# Patient Record
Sex: Male | Born: 1970 | Race: Black or African American | Hispanic: No | Smoking: Current every day smoker
Health system: Southern US, Community
[De-identification: ages and names within clinical notes are randomized; demographics above are authoritative.]

## PROBLEM LIST (undated history)

## (undated) DIAGNOSIS — D509 Iron deficiency anemia, unspecified: Secondary | ICD-10-CM

## (undated) DIAGNOSIS — J154 Pneumonia due to other streptococci: Secondary | ICD-10-CM

## (undated) DIAGNOSIS — I8390 Asymptomatic varicose veins of unspecified lower extremity: Secondary | ICD-10-CM

## (undated) DIAGNOSIS — R627 Adult failure to thrive: Secondary | ICD-10-CM

## (undated) DIAGNOSIS — B20 Human immunodeficiency virus [HIV] disease: Secondary | ICD-10-CM

## (undated) DIAGNOSIS — D61818 Other pancytopenia: Secondary | ICD-10-CM

## (undated) DIAGNOSIS — A312 Disseminated mycobacterium avium-intracellulare complex (DMAC): Secondary | ICD-10-CM

## (undated) HISTORY — PX: LUNG SURGERY: SHX703

---

## 2014-08-02 DIAGNOSIS — J154 Pneumonia due to other streptococci: Secondary | ICD-10-CM

## 2014-08-02 HISTORY — DX: Pneumonia due to other streptococci: J15.4

## 2014-08-28 ENCOUNTER — Encounter (HOSPITAL_BASED_OUTPATIENT_CLINIC_OR_DEPARTMENT_OTHER): Payer: Self-pay

## 2014-08-28 ENCOUNTER — Emergency Department (HOSPITAL_BASED_OUTPATIENT_CLINIC_OR_DEPARTMENT_OTHER): Payer: BLUE CROSS/BLUE SHIELD

## 2014-08-28 ENCOUNTER — Emergency Department (HOSPITAL_BASED_OUTPATIENT_CLINIC_OR_DEPARTMENT_OTHER)
Admission: EM | Admit: 2014-08-28 | Discharge: 2014-08-28 | Disposition: A | Discharge status: Home / Self Care | Payer: BLUE CROSS/BLUE SHIELD | Attending: Emergency Medicine | Admitting: Emergency Medicine

## 2014-08-28 DIAGNOSIS — B20 Human immunodeficiency virus [HIV] disease: Secondary | ICD-10-CM

## 2014-08-28 DIAGNOSIS — Z5320 Procedure and treatment not carried out because of patient's decision for unspecified reasons: Secondary | ICD-10-CM

## 2014-08-28 DIAGNOSIS — Z720 Tobacco use: Secondary | ICD-10-CM | POA: Diagnosis not present

## 2014-08-28 DIAGNOSIS — Z5329 Procedure and treatment not carried out because of patient's decision for other reasons: Secondary | ICD-10-CM

## 2014-08-28 DIAGNOSIS — J159 Unspecified bacterial pneumonia: Secondary | ICD-10-CM | POA: Diagnosis not present

## 2014-08-28 DIAGNOSIS — R000 Tachycardia, unspecified: Secondary | ICD-10-CM | POA: Diagnosis not present

## 2014-08-28 DIAGNOSIS — Z21 Asymptomatic human immunodeficiency virus [HIV] infection status: Secondary | ICD-10-CM | POA: Diagnosis not present

## 2014-08-28 DIAGNOSIS — B370 Candidal stomatitis: Secondary | ICD-10-CM | POA: Insufficient documentation

## 2014-08-28 DIAGNOSIS — R079 Chest pain, unspecified: Secondary | ICD-10-CM | POA: Diagnosis present

## 2014-08-28 DIAGNOSIS — J189 Pneumonia, unspecified organism: Secondary | ICD-10-CM

## 2014-08-28 LAB — T-HELPER CELLS (CD4) COUNT (NOT AT ARMC)
CD4 % Helper T Cell: 1 % — ABNORMAL LOW (ref 33–55)
CD4 T Cell Abs: 10 /uL — ABNORMAL LOW (ref 400–2700)

## 2014-08-28 LAB — HEPATIC FUNCTION PANEL
ALBUMIN: 3.2 g/dL — AB (ref 3.5–5.2)
ALT: 12 U/L (ref 0–53)
AST: 19 U/L (ref 0–37)
Alkaline Phosphatase: 71 U/L (ref 39–117)
Bilirubin, Direct: 0.2 mg/dL (ref 0.0–0.5)
Indirect Bilirubin: 0.4 mg/dL (ref 0.3–0.9)
TOTAL PROTEIN: 7.1 g/dL (ref 6.0–8.3)
Total Bilirubin: 0.6 mg/dL (ref 0.3–1.2)

## 2014-08-28 LAB — CBC
HEMATOCRIT: 39.9 % (ref 39.0–52.0)
HEMOGLOBIN: 13 g/dL (ref 13.0–17.0)
MCH: 27.8 pg (ref 26.0–34.0)
MCHC: 32.6 g/dL (ref 30.0–36.0)
MCV: 85.3 fL (ref 78.0–100.0)
Platelets: 177 10*3/uL (ref 150–400)
RBC: 4.68 MIL/uL (ref 4.22–5.81)
RDW: 13 % (ref 11.5–15.5)
WBC: 8.5 10*3/uL (ref 4.0–10.5)

## 2014-08-28 LAB — BASIC METABOLIC PANEL
Anion gap: 2 — ABNORMAL LOW (ref 5–15)
BUN: 9 mg/dL (ref 6–23)
CALCIUM: 8.1 mg/dL — AB (ref 8.4–10.5)
CHLORIDE: 100 mmol/L (ref 96–112)
CO2: 28 mmol/L (ref 19–32)
CREATININE: 0.76 mg/dL (ref 0.50–1.35)
GFR calc Af Amer: >90 mL/min (ref >90)
GFR calc non Af Amer: >90 mL/min (ref >90)
GLUCOSE: 97 mg/dL (ref 70–99)
Potassium: 3.5 mmol/L (ref 3.5–5.1)
Sodium: 130 mmol/L — ABNORMAL LOW (ref 135–145)

## 2014-08-28 LAB — INFLUENZA PANEL BY PCR (TYPE A & B)
H1N1 flu by pcr: NOT DETECTED
INFLBPCR: NEGATIVE
Influenza A By PCR: NEGATIVE

## 2014-08-28 LAB — TROPONIN I: Troponin I: <0.03 ng/mL (ref <0.031)

## 2014-08-28 MED ORDER — CEFTRIAXONE SODIUM 1 G IJ SOLR
INTRAMUSCULAR | Status: AC
  Filled 2014-08-28: qty 10

## 2014-08-28 MED ORDER — IBUPROFEN 800 MG PO TABS
800.0000 mg | ORAL_TABLET | Freq: Once | ORAL | Status: AC
  Administered 2014-08-28: 800 mg via ORAL
  Filled 2014-08-28: qty 1

## 2014-08-28 MED ORDER — AZITHROMYCIN 250 MG PO TABS: 250.0000 mg | ORAL_TABLET | Freq: Every day | ORAL | Status: DC

## 2014-08-28 MED ORDER — ONDANSETRON HCL 4 MG/2ML IJ SOLN
4.0000 mg | Freq: Once | INTRAMUSCULAR | Status: AC
  Administered 2014-08-28: 4 mg via INTRAVENOUS
  Filled 2014-08-28: qty 2

## 2014-08-28 MED ORDER — ACETAMINOPHEN 325 MG PO TABS
650.0000 mg | ORAL_TABLET | Freq: Once | ORAL | Status: AC
  Administered 2014-08-28: 650 mg via ORAL
  Filled 2014-08-28: qty 2

## 2014-08-28 MED ORDER — SULFAMETHOXAZOLE-TRIMETHOPRIM 800-160 MG PO TABS: 1.0000 | ORAL_TABLET | Freq: Two times a day (BID) | ORAL | Status: DC

## 2014-08-28 MED ORDER — AZITHROMYCIN 250 MG PO TABS
500.0000 mg | ORAL_TABLET | Freq: Once | ORAL | Status: AC
  Administered 2014-08-28: 500 mg via ORAL
  Filled 2014-08-28: qty 2

## 2014-08-28 MED ORDER — CLOTRIMAZOLE 10 MG MT TROC: 10.0000 mg | Freq: Every day | OROMUCOSAL | Status: DC

## 2014-08-28 MED ORDER — DEXTROSE 5 % IV SOLN
1.0000 g | Freq: Once | INTRAVENOUS | Status: AC
  Administered 2014-08-28: 1 g via INTRAVENOUS

## 2014-08-28 MED ORDER — SULFAMETHOXAZOLE-TRIMETHOPRIM 800-160 MG PO TABS
1.0000 | ORAL_TABLET | Freq: Once | ORAL | Status: AC
  Administered 2014-08-28: 1 via ORAL
  Filled 2014-08-28: qty 1

## 2014-08-28 NOTE — ED Notes (Signed)
Patient here with right sided chest pain that started at 0100. Reports some shortness of breath with same, has had cold symptoms for the past few days. Diagnosed with HIV in 2001 and not currently taking any meds for same. Reports that the pain is worse with movement and inspiration.

## 2014-08-28 NOTE — ED Provider Notes (Signed)
CSN: [id number]     Arrival date & time 08/28/14  3267 History   First MD Initiated Contact with Patient 08/28/14 0827     Chief Complaint  Patient presents with  . Chest Pain     (Consider location/radiation/quality/duration/timing/severity/associated sxs/prior Treatment) HPI The patient has had about a month of cough. However only recently over the past couple of days as he developed fever and chest pain. The cough has become very harsh. And he has chest pain with coughing. He reports the pain is all of his back and chest and it aches. He denies he is brought up any blood with coughing. He reports occasionally there is a small amount of sputum. He denies significant associated nasal drainage discharge or sore throat. He denies she's developed rashes or lower extremity swelling or pain. The patient has HIV but has not been treated with antivirals for over a year. He has not been seeking any medical care. The patient states he is a daily smoker. He denies any drug use or IV drug use. Past Medical History  Diagnosis Date  . HIV (human immunodeficiency virus infection)    History reviewed. No pertinent past surgical history. No family history on file. History  Substance Use Topics  . Smoking status: Current Every Day Smoker -- 1.00 packs/day    Types: Cigarettes  . Smokeless tobacco: Not on file  . Alcohol Use: Yes    Review of Systems  10 Systems reviewed and are negative for acute change except as noted in the HPI.   Allergies  Review of patient's allergies indicates no known allergies.  Home Medications   Prior to Admission medications   Medication Sig Start Date End Date Taking? Authorizing Provider  azithromycin (ZITHROMAX) 250 MG tablet Take 1 tablet (250 mg total) by mouth daily. 1 every day until finished. 08/28/14   Charlesetta Shanks, MD  clotrimazole (MYCELEX) 10 MG troche Take 1 tablet (10 mg total) by mouth 5 (five) times daily. 08/28/14   Charlesetta Shanks, MD   sulfamethoxazole-trimethoprim (SEPTRA DS) 800-160 MG per tablet Take 1 tablet by mouth every 12 (twelve) hours. 08/28/14   Charlesetta Shanks, MD   BP 112/69 mmHg  Pulse 95  Temp(Src) 100 F (37.8 C) (Oral)  Resp 20  Ht 5\' 7"  (1.702 m)  Wt 170 lb (77.111 kg)  BMI 26.62 kg/m2  SpO2 96% Physical Exam  Constitutional: He is oriented to person, place, and time.  The patient is thin in appearance. He has harsh cough. He is not in acute respiratory distress. The patient's mental status is clear.  HENT:  Head: Normocephalic and atraumatic.  Nose: Nose normal.  The throat and oral mucosa are erythematous with what appears to be a small amount of white plaque on the mucosal surfaces of the soft palate and the posterior throat.  Eyes: EOM are normal. Pupils are equal, round, and reactive to light.  Bilateral conjunctiva have mild injection.  Neck: Neck supple.  Cardiovascular: Regular rhythm, normal heart sounds and intact distal pulses.   Tachycardic.  Pulmonary/Chest: Effort normal and breath sounds normal. No respiratory distress. He has no wheezes. He has no rales.  Intermittent harsh cough paroxysmal.  Abdominal: Soft. Bowel sounds are normal. He exhibits no distension. There is no tenderness.  Musculoskeletal: Normal range of motion. He exhibits no edema.  Neurological: He is alert and oriented to person, place, and time. He has normal strength. Coordination normal. GCS eye subscore is 4. GCS verbal subscore is 5. GCS motor  subscore is 6.  Skin: Skin is warm, dry and intact.  Psychiatric: He has a normal mood and affect.    ED Course  Procedures (including critical care time) Labs Review Labs Reviewed  BASIC METABOLIC PANEL - Abnormal; Notable for the following:    Sodium 130 (*)    Calcium 8.1 (*)    Anion gap 2 (*)    All other components within normal limits  HEPATIC FUNCTION PANEL - Abnormal; Notable for the following:    Albumin 3.2 (*)    All other components within normal  limits  CULTURE, BLOOD (ROUTINE X 2)  CULTURE, BLOOD (ROUTINE X 2)  CBC  TROPONIN I  T-HELPER CELLS (CD4) COUNT  INFLUENZA PANEL BY PCR (TYPE A & B, H1N1)    Imaging Review Dg Chest Port 1 View  08/28/2014   CLINICAL DATA:  Right-sided chest pain starting at 1 a.m. Some dyspnea. HIV-positive, not currently on medications.  EXAM: PORTABLE CHEST - 1 VIEW  COMPARISON:  None.  FINDINGS: A single AP portable view of the chest demonstrates no focal airspace consolidation or alveolar edema. The lungs are grossly clear. There is no large effusion or pneumothorax. There is mild cardiomegaly. Cardiac and mediastinal contours are otherwise unremarkable.  IMPRESSION: Cardiomegaly.   Electronically Signed   By: Andreas Newport M.D.   On: 08/28/2014 09:31     EKG Interpretation None      MDM   Final diagnoses:  HIV (human immunodeficiency virus infection)  Community acquired pneumonia  Candidiasis of mouth  Left against medical advice   The patient presents with untreated HIV and acute fever and chronic cough. I had significant concern for immunosuppression and possible advanced disease. I did advise the patient for admission and further evaluation for HIV associated pneumonia conditions. The patient however refuses admission. He reports that he cannot be in the hospital this point time and will have to pursue outpatient treatment. His mother who is with him states that his refusal for inpatient treatment has to do with fear of losing his job. The patient does not state his reason. The patient will be treated with Rocephin and Zithromax for possible community-acquired pneumonia. Bactrim will be added for pneumocystis coverage. Currently there are no appearance of cavitary lesions on his chest x-ray to suggest mycobacterium. Oral mucosa appear suspicious for candidiasis. Patient denies pain or sore throat.    Charlesetta Shanks, MD 08/28/14 1110

## 2014-08-28 NOTE — Discharge Instructions (Signed)
Pneumonia   Because you have untreated HIV, your symptoms may be much more severe and require more complicated treatment. It is very important that you schedule a recheck with an infectious disease specialist as soon as possible.   Pneumonia is an infection of the lungs.  CAUSES Pneumonia may be caused by bacteria or a virus. Usually, these infections are caused by breathing infectious particles into the lungs (respiratory tract). SIGNS AND SYMPTOMS   Cough.  Fever.  Chest pain.  Increased rate of breathing.  Wheezing.  Mucus production. DIAGNOSIS  If you have the common symptoms of pneumonia, your health care provider will typically confirm the diagnosis with a chest X-ray. The X-ray will show an abnormality in the lung (pulmonary infiltrate) if you have pneumonia. Other tests of your blood, urine, or sputum may be done to find the specific cause of your pneumonia. Your health care provider may also do tests (blood gases or pulse oximetry) to see how well your lungs are working. TREATMENT  Some forms of pneumonia may be spread to other people when you cough or sneeze. You may be asked to wear a mask before and during your exam. Pneumonia that is caused by bacteria is treated with antibiotic medicine. Pneumonia that is caused by the influenza virus may be treated with an antiviral medicine. Most other viral infections must run their course. These infections will not respond to antibiotics.  HOME CARE INSTRUCTIONS   Cough suppressants may be used if you are losing too much rest. However, coughing protects you by clearing your lungs. You should avoid using cough suppressants if you can.  Your health care provider may have prescribed medicine if he or she thinks your pneumonia is caused by bacteria or influenza. Finish your medicine even if you start to feel better.  Your health care provider may also prescribe an expectorant. This loosens the mucus to be coughed up.  Take medicines only  as directed by your health care provider.  Do not smoke. Smoking is a common cause of bronchitis and can contribute to pneumonia. If you are a smoker and continue to smoke, your cough may last several weeks after your pneumonia has cleared.  A cold steam vaporizer or humidifier in your room or home may help loosen mucus.  Coughing is often worse at night. Sleeping in a semi-upright position in a recliner or using a couple pillows under your head will help with this.  Get rest as you feel it is needed. Your body will usually let you know when you need to rest. PREVENTION A pneumococcal shot (vaccine) is available to prevent a common bacterial cause of pneumonia. This is usually suggested for:  People over 4 years old.  Patients on chemotherapy.  People with chronic lung problems, such as bronchitis or emphysema.  People with immune system problems. If you are over 65 or have a high risk condition, you may receive the pneumococcal vaccine if you have not received it before. In some countries, a routine influenza vaccine is also recommended. This vaccine can help prevent some cases of pneumonia.You may be offered the influenza vaccine as part of your care. If you smoke, it is time to quit. You may receive instructions on how to stop smoking. Your health care provider can provide medicines and counseling to help you quit. SEEK MEDICAL CARE IF: You have a fever. SEEK IMMEDIATE MEDICAL CARE IF:   Your illness becomes worse. This is especially true if you are elderly or weakened from  any other disease.  You cannot control your cough with suppressants and are losing sleep.  You begin coughing up blood.  You develop pain which is getting worse or is uncontrolled with medicines.  Any of the symptoms which initially brought you in for treatment are getting worse rather than better.  You develop shortness of breath or chest pain. MAKE SURE YOU:   Understand these instructions.  Will  watch your condition.  Will get help right away if you are not doing well or get worse. Document Released: 07/19/2005 Document Revised: 12/03/2013 Document Reviewed: 10/08/2010 [facility] Patient Information 2015 [place], [place]. This information is not intended to replace advice given to you by your health care provider. Make sure you discuss any questions you have with your health care provider.  Emergency Department Resource Guide 1) Find a Doctor and Pay Out of Pocket Although you won't have to find out who is covered by your insurance plan, it is a good idea to ask around and get recommendations. You will then need to call the office and see if the doctor you have chosen will accept you as a new patient and what types of options they offer for patients who are self-pay. Some doctors offer discounts or will set up payment plans for their patients who do not have insurance, but you will need to ask so you aren't surprised when you get to your appointment.  2) Contact Your Local Health Department Not all health departments have doctors that can see patients for sick visits, but many do, so it is worth a call to see if yours does. If you don't know where your local health department is, you can check in your phone book. The CDC also has a tool to help you locate your state's health department, and many state websites also have listings of all of their local health departments.  3) Find a [facility] If your illness is not likely to be very severe or complicated, you may want to try a walk in clinic. These are popping up all over the country in pharmacies, drugstores, and shopping centers. They're usually staffed by nurse practitioners or physician assistants that have been trained to treat common illnesses and complaints. They're usually fairly quick and inexpensive. However, if you have serious medical issues or chronic medical problems, these are probably not your best option.  No Primary Care  Doctor: - Call Health Connect at  [phone] - they can help you locate a primary care doctor that  accepts your insurance, provides certain services, etc. - Physician Referral Service- [phone]  Chronic Pain Problems: Organization         Address  Phone   Notes  [facility]  [phone] Patients need to be referred by their primary care doctor.   Medication Assistance: Organization         Address  Phone   Notes  [facility] Medication [facility] [address] [place], [place] [address] [phone] --Must be a resident of [facility] -- Must have NO insurance coverage whatsoever (no Medicaid/ Medicare, etc.) -- The pt. MUST have a primary care doctor that directs their care regularly and follows them in the community   MedAssist  [phone]   [employer]  [phone]    Agencies that provide inexpensive medical care: Organization         Address  Phone   Notes  [place]  [phone]  [facility]    [phone]   [facility] [place], [place] [address] [phone]   [place]. [address], [place] [phone]   Planned Parenthood    [phone]   [facility]    [phone]   [place] and [place] [address], [place] Phone:  [phone], Fax:  [phone] Hours of Operation:  9 am - 6 pm, M-F.  Also accepts Medicaid/Medicare and self-pay.  [facility] for [place] [address] [place], [address], [place] Phone: [phone], Fax: [phone]. Hours of Operation:  8:30 am - 5:30 pm, M-F.  Also accepts Medicaid and self-pay.  [facility] [place] [address] [place] Phone: [phone]   [place], [place], [place] [phone], Ext. [phone] Mondays & Thursdays: 7-9 AM.  First 15 patients are seen on a first  come, first serve basis.    [place] Providers:  Organization         Address  Phone   Notes  [facility] [address], [place] [phone] Also accepts self-pay patients.  [facility] [address], [place]  [phone]   [place], [address], [place] [phone]   [facility] [address], [place] [phone]   Lucianne Lei [address], [place]   [phone] Only accepts [place] Access [place] patients after they have their name applied to their card.   Self-Pay (no insurance) in [facility]:  Organization         Address  Phone   Notes  Sickle Cell Patients, [facility] [place] [phone]   [facility] [place] [phone]   [facility] [place]  [place], [place], [place] [phone]   [facility]/Dr. Osei-Bonsu  [address], [place] or [address], [place] [phone] Phone number for both [place] and [place] locations is the same.  [facility]gent [facility] [address] [place] [phone]   [facility] [address] [place] or [address] [phone]   [facility] [place] [address] [place] [phone], phone; [phone], fax Sees patients 1st and 3rd Saturday of every month.  Must not qualify for public or private insurance (i.e. Medicaid, Medicare,  Health Choice, Veterans' Benefits)  Household income should be no more than 200% of the poverty level The clinic cannot treat you if you are pregnant or think you are pregnant  Sexually transmitted diseases are not treated at the clinic.    Dental Care: Organization          Address  Phone  Notes  [facility] Department of [facility] [place] [phone] Accepts children up to age 37 who are enrolled in [place] or [place]; pregnant women with a Medicaid card; and children who have applied for Medicaid or  Health Choice, but were declined, whose parents can pay a reduced fee at time of service.  [facility] Department of [facility]  [address],  [place] [phone] Accepts children up to age 97 who are enrolled in Medicaid or [place] Health Choice; pregnant women with a Medicaid card; and children who have applied for Medicaid or [place] Health Choice, but were declined, whose parents can pay a reduced fee at time of service.  [place] Adult [facility] Access PROGRAM  [place] [phone] Patients are seen by appointment only. Walk-ins are not accepted. [place] will see patients 6 years of age and older. Monday - Tuesday (8am-5pm) Most Wednesdays (8:30-5pm) $30 per visit, cash only  [facility] Adult Dental Access PROGRAM  [address], [facility] [phone] Patients are seen by appointment only. Walk-ins are not accepted. [place] will see patients 70 years of age and older. One Wednesday Evening (Monthly: Volunteer Based).  $30 per visit, cash only  [place]  [phone] for adults; Children under age 35, call Graduate Pediatric Dentistry at [phone]. Children aged 36-14, please call [phone] to request a pediatric application.  Dental services are provided in all areas of dental care including fillings, crowns and bridges, complete and partial dentures, implants, gum treatment, root canals, and extractions. Preventive care is also provided. Treatment is provided to both adults and children. Patients are selected via a lottery and there is often a waiting list.   [facility] [address], [place]  [phone] [email or web address]   [facility] [address] [place], [place] [phone], Ext. [phone] Second and Fourth Thursday of each month, opens at 6:30 AM; Clinic ends at 9 AM.  Patients are seen on a first-come first-served basis, and a limited number are seen during each clinic.   [facility]  [address] Hillard Danker [place], [place] [phone]   Eligibility Requirements You must have lived in [place], [place], or [place] counties for at least the last three months.   You cannot be eligible for state or federal sponsored [employer], including [employer], [place], or [employer].   You generally cannot be eligible for healthcare insurance through your employer.    How to apply: Eligibility screenings are held every Tuesday and Wednesday afternoon from 1:00 pm until 4:00 pm. You do not need an appointment for the interview!  [facility] [address] [place], [place]   [place]  [place] Department  [place]  [phone]    Behavioral Health Resources in the Community: Intensive Outpatient Programs Organization         Address  Phone  Notes  [place]. [address] [place], [place] [phone]   [facility] Outpatient [address] [place], [place]   ADS: Alcohol & Drug Svcs [address] [place], [place]   [place] [address]  [place], [place] or [phone]   Substance Abuse Resources Organization         Address  Phone  Notes  Alcohol and Drug Services  [phone]   [place]  [phone]   The [place]   Chinita Pester  [phone]   Residential & Outpatient Substance Abuse Program  [phone]   Psychological  Services Organization         Address  Phone  Notes  [place]  [place]  336-  [place] [address] [place] or [phone]    Mobile Crisis Teams Organization         Address  Phone  Notes  [facility], Mobile Crisis Care Unit  [phone]   [facility] Psychotherapeutic Services  [address] [place], [place]   [place] Levels [address], [place] [phone]    Self-Help/Support Groups Organization         Address  Phone             Notes  [place]. of [place] - variety of support groups  [place] Call for more information  Narcotics Anonymous (NA), [facility] [address], [employer] [place]  2 meetings at this location   Special educational needs teacher         Address  Phone  Notes  ASAP Residential Treatment [place],    [place]  [phone]   [facility]  [address], [place] [address], [place], [place]   [place], [place] [phone] Admissions: 8am-3pm M-F  Incentives Substance [place] [address]    [place], [place] [phone]   [facility] [address] [place], [place], [place]   [facility] [address],  [place], [place]   [facility] - Intensive Outpatient [address] Kristeen Mans [address], [place], [place]   [facility] ([place] [place].,  [place], [place] [phone] or [phone]   [facility]idential Treatment Services (RTS) [address] [place], [place] Accepts Medicaid  [facility] [place] [address],  [place] [phone] Substance Abuse/Addiction Treatment   [facility] Organization         Address  Phone  Notes  [facility]  [phone]   Domenic Schwab, PhD [address] Arlis Porta [place], [place]   [phone] or [phone]   [place], [place] [phone]   [facility] [address], [place], [place] [phone] Insurance/Medicaid/sponsorship through [facility] and Families [address] [place]                                    [place], [place] [phone] [place] [address] [place] [address], [place] [phone]    Dr. Adele Schilder  [phone]   [facility] Dept. 1) [address] [place] 2) [place] 3)  [place] 65, [place] [phone]  [phone]   [place] [phone] or [phone] (After Hours)

## 2014-08-29 ENCOUNTER — Telehealth (HOSPITAL_BASED_OUTPATIENT_CLINIC_OR_DEPARTMENT_OTHER): Payer: Self-pay | Admitting: *Deleted

## 2014-08-31 ENCOUNTER — Telehealth (HOSPITAL_BASED_OUTPATIENT_CLINIC_OR_DEPARTMENT_OTHER): Payer: Self-pay | Admitting: Emergency Medicine

## 2014-08-31 LAB — CULTURE, BLOOD (ROUTINE X 2)

## 2014-08-31 NOTE — Progress Notes (Signed)
ED Antimicrobial Stewardship Positive Culture Follow Up   Miguel Campos is an 44 y.o. male who presented to [facility] on 08/28/2014 with a chief complaint of cough, fever, chest pain  Chief Complaint  Patient presents with  . Chest Pain    Recent Results (from the past 720 hour(s))  Culture, blood (routine x 2)     Status: None   Collection Time: 08/28/14  8:30 AM  Result Value Ref Range Status   Specimen Description BLOOD RT [facility]  Final   Special Requests NONE BOTTLES DRAWN AEROBIC AND ANAEROBIC 5 CC  Final   Culture   Final    STREPTOCOCCUS PNEUMONIAE Note: Gram Stain Report Called to,Read Back By and Verified With: [name] 08/29/14 445AM [place] Performed at [facility]    Report Status 08/31/2014 FINAL  Final   Organism ID, Bacteria STREPTOCOCCUS PNEUMONIAE  Final      Susceptibility   Streptococcus pneumoniae - MIC (ETEST)*    CEFTRIAXONE 0.025 SENSITIVE Sensitive     LEVOFLOXACIN 0.075 SENSITIVE Sensitive     PENICILLIN 2.0 RESISTANT Resistant     * STREPTOCOCCUS PNEUMONIAE  Culture, blood (routine x 2)     Status: None (Preliminary result)   Collection Time: 08/28/14 10:30 AM  Result Value Ref Range Status   Specimen Description BLOOD RIGHT HAND  Final   Special Requests BOTTLES DRAWN AEROBIC AND ANAEROBIC 5CC  Final   Culture   Final           BLOOD CULTURE RECEIVED NO GROWTH TO DATE CULTURE WILL BE HELD FOR 5 DAYS BEFORE ISSUING A FINAL NEGATIVE REPORT Performed at [facility]    Report Status PENDING  Incomplete    [x]  Needs to be admitted for treatment  42 YOM with hx HIV diagnosed in '01 who has not been taking treatment for over a year who presented with cough, fever, and CP. The patient refused to be admitted for work-up at the time - was given Rocephin IM x 1 and discharged on Bactrim and Azithromycin  1/2 BCx are now growing Strept PNA (S-CTX, Levaquin; R-PCN). CD4 count was 10. The patient needs to be admitted for treatment of S PNA  bacteremia and management of HIV.  Additional follow-up: Call the patient to be admitted to the hospital  ED Provider: Carlisle Cater, PA-C  [name] 08/31/2014, 3:13 PM Infectious Diseases Pharmacist Phone# [phone]

## 2014-08-31 NOTE — Telephone Encounter (Signed)
Post ED Visit - Positive Culture Follow-up: Successful Patient Follow-Up  Culture assessed and recommendations reviewed by: []  Wes Clio, Pharm.D., BCPS []  Heide Guile, Pharm.D., BCPS [x]  Alycia Rossetti, Pharm.D., BCPS []  [place], Pharm.D., BCPS, AAHIVP []  Legrand Como, Pharm.D., BCPS, AAHIVP []  Hassie Bruce, Pharm.D. []  Milus Glazier, [place].D.  Positive blood culture  []  Patient discharged without antimicrobial prescription and treatment is now indicated []  Organism is resistant to prescribed ED discharge antimicrobial [x]  Patient with positive blood cultures  Changes discussed with ED provider: Alecia Lemming PA  Call patient to come in for admission d/t positive blood culture  08/31/14 @ 1702 - left message to call office #   Ernesta Amble 08/31/2014, 5:08 PM

## 2014-09-01 ENCOUNTER — Telehealth (HOSPITAL_BASED_OUTPATIENT_CLINIC_OR_DEPARTMENT_OTHER): Payer: Self-pay | Admitting: Emergency Medicine

## 2014-09-01 NOTE — Progress Notes (Signed)
ED Antimicrobial Stewardship Positive Culture Follow Up   Additional follow-up - per discussion with Dr. Linus Salmons today (ID physician). If the patient refuses to be admitted, then an oral prescription for Levaquin 750 mg po daily for 14 days should be called in for the patient. This was also discussed with the Carlisle Cater, PA-C.  Again, this is ONLY IF the patient is not willing to be admitted. Admission is the best option for this patient given his positive blood cultures and untreated HIV (CD4 10)  Plan: If the patient refuses admission - will need a prescription called in for Levaquin 750 mg po daily for 14 days  Alycia Rossetti, PharmD, BCPS Clinical Pharmacist Pager: [phone] 09/01/2014 2:33 PM

## 2014-09-02 ENCOUNTER — Telehealth: Payer: Self-pay | Admitting: *Deleted

## 2014-09-03 LAB — CULTURE, BLOOD (ROUTINE X 2): Culture: NO GROWTH

## 2014-09-16 ENCOUNTER — Telehealth (HOSPITAL_COMMUNITY): Payer: Self-pay

## 2014-09-16 NOTE — ED Notes (Signed)
Unable to contact pt by mail or telephone. Unable to communicate lab results or treatment changes. 

## 2014-12-17 ENCOUNTER — Encounter (HOSPITAL_BASED_OUTPATIENT_CLINIC_OR_DEPARTMENT_OTHER): Payer: Self-pay

## 2014-12-17 ENCOUNTER — Emergency Department (HOSPITAL_BASED_OUTPATIENT_CLINIC_OR_DEPARTMENT_OTHER)
Admission: EM | Admit: 2014-12-17 | Discharge: 2014-12-17 | Discharge status: Against Medical Advice | Payer: BLUE CROSS/BLUE SHIELD | Attending: Emergency Medicine | Admitting: Emergency Medicine

## 2014-12-17 ENCOUNTER — Emergency Department (HOSPITAL_BASED_OUTPATIENT_CLINIC_OR_DEPARTMENT_OTHER): Payer: BLUE CROSS/BLUE SHIELD

## 2014-12-17 DIAGNOSIS — I8390 Asymptomatic varicose veins of unspecified lower extremity: Secondary | ICD-10-CM

## 2014-12-17 DIAGNOSIS — I8391 Asymptomatic varicose veins of right lower extremity: Secondary | ICD-10-CM | POA: Insufficient documentation

## 2014-12-17 DIAGNOSIS — Z720 Tobacco use: Secondary | ICD-10-CM | POA: Diagnosis not present

## 2014-12-17 DIAGNOSIS — B20 Human immunodeficiency virus [HIV] disease: Secondary | ICD-10-CM | POA: Insufficient documentation

## 2014-12-17 DIAGNOSIS — M79661 Pain in right lower leg: Secondary | ICD-10-CM | POA: Diagnosis present

## 2014-12-17 DIAGNOSIS — R509 Fever, unspecified: Secondary | ICD-10-CM | POA: Diagnosis not present

## 2014-12-17 MED ORDER — IBUPROFEN 800 MG PO TABS
ORAL_TABLET | ORAL | Status: AC
  Filled 2014-12-17: qty 1

## 2014-12-17 MED ORDER — IBUPROFEN 800 MG PO TABS
800.0000 mg | ORAL_TABLET | Freq: Once | ORAL | Status: AC
  Administered 2014-12-17: 800 mg via ORAL

## 2014-12-17 NOTE — ED Notes (Signed)
Pt unaware he had a fever-states he has been having head and chest congestion with cough

## 2014-12-17 NOTE — ED Notes (Signed)
Patient's visitor states patient is ready to go because he has to go to the bank, service recovery attempted, charge nurse has been made aware.

## 2014-12-17 NOTE — Discharge Instructions (Signed)
Varicose Veins Varicose veins are veins that have become enlarged and twisted. CAUSES This condition is the result of valves in the veins not working properly. Valves in the veins help return blood from the leg to the heart. If these valves are damaged, blood flows backwards and backs up into the veins in the leg near the skin. This causes the veins to become larger. People who are on their feet a lot, who are pregnant, or who are overweight are more likely to develop varicose veins. SYMPTOMS   Bulging, twisted-appearing, bluish veins, most commonly found on the legs.  Leg pain or a feeling of heaviness. These symptoms may be worse at the end of the day.  Leg swelling.  Skin color changes. DIAGNOSIS  Varicose veins can usually be diagnosed with an exam of your legs by your caregiver. He or she may recommend an ultrasound of your leg veins. TREATMENT  Most varicose veins can be treated at home.However, other treatments are available for people who have persistent symptoms or who want to treat the cosmetic appearance of the varicose veins. These include:  Laser treatment of very small varicose veins.  Medicine that is shot (injected) into the vein. This medicine hardens the walls of the vein and closes off the vein. This treatment is called sclerotherapy. Afterwards, you may need to wear clothing or bandages that apply pressure.  Surgery. HOME CARE INSTRUCTIONS   Do not stand or sit in one position for long periods of time. Do not sit with your legs crossed. Rest with your legs raised during the day.  Wear elastic stockings or support hose. Do not wear other tight, encircling garments around the legs, pelvis, or waist.  Walk as much as possible to increase blood flow.  Raise the foot of your bed at night with 2-inch blocks.  If you get a cut in the skin over the vein and the vein bleeds, lie down with your leg raised and press on it with a clean cloth until the bleeding stops. Then  place a bandage (dressing) on the cut. See your caregiver if it continues to bleed or needs stitches. SEEK MEDICAL CARE IF:   The skin around your ankle starts to break down.  You have pain, redness, tenderness, or hard swelling developing in your leg over a vein.  You are uncomfortable due to leg pain. Document Released: 04/28/2005 Document Revised: 10/11/2011 Document Reviewed: 09/14/2010 ExitCare Patient Information 2015 [employer]. This information is not intended to replace advice given to you by your health care provider. Make sure you discuss any questions you have with your health care provider.  

## 2014-12-17 NOTE — ED Notes (Signed)
Patient walked to room 6, patient given a gown to put on but patient refused to put it on. Patient states he just wants his leg looked at and he can lift up the leg of his pants.

## 2014-12-17 NOTE — ED Provider Notes (Signed)
CSN: [id number]     Arrival date & time 12/17/14  1231 History   First MD Initiated Contact with Patient 12/17/14 1402     Chief Complaint  Patient presents with  . Leg Pain     (Consider location/radiation/quality/duration/timing/severity/associated sxs/prior Treatment) Patient is a 44 y.o. male presenting with leg pain.  Leg Pain Location:  Leg Leg location:  R lower leg Pain details:    Quality:  Aching   Radiates to:  Does not radiate   Severity:  Severe   Onset quality:  Gradual   Duration: several months.   Timing:  Constant   Progression:  Worsening Chronicity:  New Relieved by: compression socks. Exacerbated by: standing. Associated symptoms: swelling   Associated symptoms: no numbness and no tingling     Past Medical History  Diagnosis Date  . HIV (human immunodeficiency virus infection)    Past Surgical History  Procedure Laterality Date  . Lung surgery     No family history on file. History  Substance Use Topics  . Smoking status: Current Every Day Smoker -- 1.00 packs/day    Types: Cigarettes  . Smokeless tobacco: Not on file  . Alcohol Use: Yes    Review of Systems  All other systems reviewed and are negative.     Allergies  Review of patient's allergies indicates no known allergies.  Home Medications   Prior to Admission medications   Not on File   BP 126/99 mmHg  Pulse 82  Temp(Src) 101.5 F (38.6 C) (Oral)  Resp 20  Ht 5\' 7"  (1.702 m)  Wt 160 lb (72.576 kg)  BMI 25.05 kg/m2  SpO2 97% Physical Exam  Constitutional: He is oriented to person, place, and time. He appears well-developed and well-nourished. No distress.  HENT:  Head: Normocephalic and atraumatic.  Mouth/Throat: Oropharynx is clear and moist.  Eyes: Conjunctivae are normal. Pupils are equal, round, and reactive to light. No scleral icterus.  Neck: Neck supple.  Cardiovascular: Normal rate, regular rhythm, normal heart sounds and intact distal pulses.   No murmur  heard. Pulmonary/Chest: Effort normal and breath sounds normal. No stridor. No respiratory distress. He has no wheezes. He has no rales.  Abdominal: Soft. He exhibits no distension. There is no tenderness.  Musculoskeletal: Normal range of motion. He exhibits no edema.  Right calf with multiple varicosities which are very tender.  Not warm or erythematous. NV intact distally.  No edema.    Neurological: He is alert and oriented to person, place, and time.  Skin: Skin is warm and dry. No rash noted.  Psychiatric: He has a normal mood and affect. His behavior is normal.  Nursing note and vitals reviewed.   ED Course  Procedures (including critical care time) Labs Review Labs Reviewed - No data to display  Imaging Review No results found.   EKG Interpretation None      MDM   Final diagnoses:  Varicose veins  Fever, unspecified fever cause    44 yo male with hx of HIV, untreated, who presents with several months worth of right calf pain and varicosities.  He was also found to have a fever.  He denies any symptoms other that his right leg pain.  Specifically, he denies cough, URI symptoms, abdominal pain, N/V/D.  Due to his fever and presumed immunocompromise (he does not know his HIV or CD4 status), recommended workup including blood work, CXR, UA, and RLE [place].  He declined this workup, stating he just wanted a referral  for a vein specialist.  He understood the risk of missed infection in immunocompromise including morbidity and death.  He was of sound mind to make medical decisions.  He agreed to follow up outpatient.  He ultimately left AMA without any further testing.      Serita Grit, MD 12/17/14 1630

## 2014-12-17 NOTE — ED Notes (Signed)
Pt left AMA.  Pt states he needs to go to the bank before they close and does not wish to stay longer.  Explained the benefits of staying and possible outcomes of leaving.  Pt aware and still wants to leave AMA.

## 2014-12-17 NOTE — ED Notes (Signed)
C/o pain to right calf x "months"-multiple varicose veins noted

## 2015-01-17 ENCOUNTER — Other Ambulatory Visit: Payer: Self-pay | Admitting: *Deleted

## 2015-01-17 DIAGNOSIS — I83811 Varicose veins of right lower extremities with pain: Secondary | ICD-10-CM

## 2015-01-29 ENCOUNTER — Encounter: Payer: Self-pay | Admitting: Vascular Surgery

## 2015-01-31 ENCOUNTER — Inpatient Hospital Stay (HOSPITAL_COMMUNITY): Admission: RE | Admit: 2015-01-31 | Payer: BLUE CROSS/BLUE SHIELD | Source: Ambulatory Visit

## 2015-01-31 ENCOUNTER — Encounter: Payer: BLUE CROSS/BLUE SHIELD | Admitting: Vascular Surgery

## 2015-03-21 HISTORY — PX: OTHER SURGICAL HISTORY: SHX169

## 2015-04-15 ENCOUNTER — Ambulatory Visit: Payer: BLUE CROSS/BLUE SHIELD

## 2015-04-16 ENCOUNTER — Telehealth: Payer: Self-pay

## 2015-04-16 NOTE — Telephone Encounter (Signed)
[facility] calling to see if patient has been scheduled for new patient office visit.   He missed initial intake appointment scheduled for 04-15-15  Laverle Patter, RN  I will reach out to patient for reschedule since labs indicate last CD4 was 10.  I spoke with patient and he says he tried to call and reschedule appointment on yesterday but was on hold for 45 minutes.  He has agreed to come for office visit on 04-17-15.  Laverle Patter, RN

## 2015-04-17 ENCOUNTER — Ambulatory Visit: Payer: BLUE CROSS/BLUE SHIELD

## 2015-05-03 DIAGNOSIS — D509 Iron deficiency anemia, unspecified: Secondary | ICD-10-CM

## 2015-05-03 DIAGNOSIS — R627 Adult failure to thrive: Secondary | ICD-10-CM

## 2015-05-03 HISTORY — DX: Iron deficiency anemia, unspecified: D50.9

## 2015-05-03 HISTORY — DX: Adult failure to thrive: R62.7

## 2015-05-13 ENCOUNTER — Emergency Department (HOSPITAL_BASED_OUTPATIENT_CLINIC_OR_DEPARTMENT_OTHER)
Admission: EM | Admit: 2015-05-13 | Discharge: 2015-05-13 | Discharge status: Against Medical Advice | Payer: BLUE CROSS/BLUE SHIELD | Attending: Emergency Medicine | Admitting: Emergency Medicine

## 2015-05-13 ENCOUNTER — Encounter (HOSPITAL_BASED_OUTPATIENT_CLINIC_OR_DEPARTMENT_OTHER): Payer: Self-pay | Admitting: *Deleted

## 2015-05-13 DIAGNOSIS — Z21 Asymptomatic human immunodeficiency virus [HIV] infection status: Secondary | ICD-10-CM | POA: Diagnosis not present

## 2015-05-13 DIAGNOSIS — Z720 Tobacco use: Secondary | ICD-10-CM | POA: Diagnosis not present

## 2015-05-13 DIAGNOSIS — B20 Human immunodeficiency virus [HIV] disease: Secondary | ICD-10-CM

## 2015-05-13 DIAGNOSIS — R197 Diarrhea, unspecified: Secondary | ICD-10-CM | POA: Insufficient documentation

## 2015-05-13 NOTE — ED Notes (Signed)
Pt states he does want to stay any longer. States he just wants antibiotic and go home. Talked with Dr Darl Householder and ware pt wants to leave. AMA form explained to pt and pt verbilized understanding and refused VS. Pt sx AMA form.

## 2015-05-13 NOTE — Discharge Instructions (Signed)
See infectious disease for follow up.   You signed out against medical advice. Please see your doctor as soon as possible.   Return to ER if you have fever, severe abdominal pain, worse diarrhea, vomiting.

## 2015-05-13 NOTE — ED Notes (Signed)
Pt refused IV and blood draw at present. States he wants to wait to see MD.

## 2015-05-13 NOTE — ED Notes (Signed)
C/o diarrhea after he eats. No pain. Onset yesterday. States his daughter also has same sx. No n/v. Only has diarrhea after eating.

## 2015-05-13 NOTE — ED Provider Notes (Signed)
CSN: [id number]     Arrival date & time 05/13/15  1011 History   First MD Initiated Contact with Patient 05/13/15 1023     Chief Complaint  Patient presents with  . Diarrhea     (Consider location/radiation/quality/duration/timing/severity/associated sxs/prior Treatment) The history is provided by the patient.  Miguel Campos is a 44 y.o. male hx of HIV not on meds (CD 4 was 10 in Jan 2016)  Who presented with diarrhea.  Patient states that he has intermittent diarrhea the last several days, only after he eats. He denies any fevers or abdominal pain. Denies any vomiting. Patient had been followed up with infectious disease at [facility] but didn't start on any meds. He called ID at [facility] a month ago but missed his appointment. He states that he saw them a week ago and had blood drawn. His daughter is sick with similar symptoms.    Past Medical History  Diagnosis Date  . HIV (human immunodeficiency virus infection) [facility])    Past Surgical History  Procedure Laterality Date  . Lung surgery     No family history on file. Social History  Substance Use Topics  . Smoking status: Current Every Day Smoker -- 1.00 packs/day    Types: Cigarettes  . Smokeless tobacco: None  . Alcohol Use: Yes    Review of Systems  Gastrointestinal: Positive for diarrhea.  All other systems reviewed and are negative.     Allergies  Review of patient's allergies indicates no known allergies.  Home Medications   Prior to Admission medications   Not on File   BP 129/82 mmHg  Pulse 105  Temp(Src) 97.7 F (36.5 C) (Oral)  Resp 18  Ht 5\' 7"  (1.702 m)  Wt 145 lb (65.772 kg)  BMI 22.71 kg/m2  SpO2 95% Physical Exam  Constitutional: He is oriented to person, place, and time.  MM slightly dry. Thin  HENT:  Head: Normocephalic.  MM slightly dry   Eyes: Conjunctivae are normal. Pupils are equal, round, and reactive to light.  Neck: Normal range of motion.  Cardiovascular: Normal rate, regular rhythm  and normal heart sounds.   Pulmonary/Chest: Effort normal and breath sounds normal. No respiratory distress. He has no wheezes. He has no rales.  Abdominal: Soft. Bowel sounds are normal. He exhibits no distension. There is no tenderness. There is no rebound.  Musculoskeletal: Normal range of motion. He exhibits no edema or tenderness.  Neurological: He is alert and oriented to person, place, and time. No cranial nerve deficit. Coordination normal.  Skin: Skin is warm and dry.  Psychiatric: He has a normal mood and affect. His behavior is normal. Judgment and thought content normal.  Nursing note and vitals reviewed.   ED Course  Procedures (including critical care time) Labs Review Labs Reviewed - No data to display  Imaging Review No results found. I have personally reviewed and evaluated these images and lab results as part of my medical decision-making.   EKG Interpretation None      MDM   Final diagnoses:  Diarrhea, unspecified type  HIV (human immunodeficiency virus infection) ([place])    Miguel Campos is a 44 y.o. male here with diarrhea. Patient HIV positive with CD4 10 and not on meds. I called ID and talked with Tammy. Patient hasn't been seen in the clinic and had canceled twice. Patient request antibiotics. I am concerned for possible opportunistic infection given HIV status. I want to order labs, CT ab/pel but patient refused. I am uncomfortable  prescribed abx without this information. Patient understands risk to [facility] but wants to leave.    Wandra Arthurs, MD 05/13/15 [phone]

## 2015-05-27 ENCOUNTER — Emergency Department (HOSPITAL_BASED_OUTPATIENT_CLINIC_OR_DEPARTMENT_OTHER)
Admission: EM | Admit: 2015-05-27 | Discharge: 2015-05-27 | Discharge status: Against Medical Advice | Payer: BLUE CROSS/BLUE SHIELD | Attending: Emergency Medicine | Admitting: Emergency Medicine

## 2015-05-27 ENCOUNTER — Emergency Department (HOSPITAL_BASED_OUTPATIENT_CLINIC_OR_DEPARTMENT_OTHER): Payer: BLUE CROSS/BLUE SHIELD

## 2015-05-27 ENCOUNTER — Encounter (HOSPITAL_BASED_OUTPATIENT_CLINIC_OR_DEPARTMENT_OTHER): Payer: Self-pay | Admitting: Emergency Medicine

## 2015-05-27 DIAGNOSIS — D649 Anemia, unspecified: Secondary | ICD-10-CM

## 2015-05-27 DIAGNOSIS — R000 Tachycardia, unspecified: Secondary | ICD-10-CM | POA: Diagnosis not present

## 2015-05-27 DIAGNOSIS — R1013 Epigastric pain: Secondary | ICD-10-CM

## 2015-05-27 DIAGNOSIS — K5900 Constipation, unspecified: Secondary | ICD-10-CM | POA: Diagnosis not present

## 2015-05-27 DIAGNOSIS — Z21 Asymptomatic human immunodeficiency virus [HIV] infection status: Secondary | ICD-10-CM | POA: Diagnosis not present

## 2015-05-27 DIAGNOSIS — B370 Candidal stomatitis: Secondary | ICD-10-CM

## 2015-05-27 DIAGNOSIS — R197 Diarrhea, unspecified: Secondary | ICD-10-CM | POA: Diagnosis not present

## 2015-05-27 DIAGNOSIS — R110 Nausea: Secondary | ICD-10-CM | POA: Diagnosis not present

## 2015-05-27 DIAGNOSIS — Z720 Tobacco use: Secondary | ICD-10-CM | POA: Diagnosis not present

## 2015-05-27 DIAGNOSIS — R1010 Upper abdominal pain, unspecified: Secondary | ICD-10-CM | POA: Diagnosis present

## 2015-05-27 LAB — URINALYSIS, ROUTINE W REFLEX MICROSCOPIC
Bilirubin Urine: NEGATIVE
Glucose, UA: NEGATIVE mg/dL
Hgb urine dipstick: NEGATIVE
KETONES UR: NEGATIVE mg/dL
LEUKOCYTES UA: NEGATIVE
NITRITE: NEGATIVE
PH: 6.5 (ref 5.0–8.0)
PROTEIN: NEGATIVE mg/dL
Specific Gravity, Urine: 1.018 (ref 1.005–1.030)
Urobilinogen, UA: 2 mg/dL — ABNORMAL HIGH (ref 0.0–1.0)

## 2015-05-27 LAB — COMPREHENSIVE METABOLIC PANEL
ALT: 18 U/L (ref 17–63)
AST: 29 U/L (ref 15–41)
Albumin: 1.9 g/dL — ABNORMAL LOW (ref 3.5–5.0)
Alkaline Phosphatase: 49 U/L (ref 38–126)
Anion gap: 5 (ref 5–15)
BUN: 9 mg/dL (ref 6–20)
CHLORIDE: 103 mmol/L (ref 101–111)
CO2: 26 mmol/L (ref 22–32)
Calcium: 7.1 mg/dL — ABNORMAL LOW (ref 8.9–10.3)
Creatinine, Ser: 0.48 mg/dL — ABNORMAL LOW (ref 0.61–1.24)
GFR calc Af Amer: >60 mL/min (ref >60)
Glucose, Bld: 97 mg/dL (ref 65–99)
POTASSIUM: 3 mmol/L — AB (ref 3.5–5.1)
SODIUM: 134 mmol/L — AB (ref 135–145)
Total Bilirubin: 0.4 mg/dL (ref 0.3–1.2)
Total Protein: 5.1 g/dL — ABNORMAL LOW (ref 6.5–8.1)

## 2015-05-27 LAB — CBC WITH DIFFERENTIAL/PLATELET
BASOS ABS: 0 10*3/uL (ref 0.0–0.1)
Basophils Relative: 0 %
EOS ABS: 0 10*3/uL (ref 0.0–0.7)
Eosinophils Relative: 0 %
HCT: 23.1 % — ABNORMAL LOW (ref 39.0–52.0)
Hemoglobin: 7.3 g/dL — ABNORMAL LOW (ref 13.0–17.0)
LYMPHS ABS: 0.2 10*3/uL — AB (ref 0.7–4.0)
Lymphocytes Relative: 4 %
MCH: 24.1 pg — ABNORMAL LOW (ref 26.0–34.0)
MCHC: 31.6 g/dL (ref 30.0–36.0)
MCV: 76.2 fL — AB (ref 78.0–100.0)
MONO ABS: 0.4 10*3/uL (ref 0.1–1.0)
MONOS PCT: 11 %
NEUTROS ABS: 3.4 10*3/uL (ref 1.7–7.7)
Neutrophils Relative %: 85 %
PLATELETS: 228 10*3/uL (ref 150–400)
RBC: 3.03 MIL/uL — ABNORMAL LOW (ref 4.22–5.81)
RDW: 19.2 % — AB (ref 11.5–15.5)
WBC: 4 10*3/uL (ref 4.0–10.5)

## 2015-05-27 LAB — LIPASE, BLOOD: LIPASE: 37 U/L (ref 11–51)

## 2015-05-27 MED ORDER — SODIUM CHLORIDE 0.9 % IV BOLUS (SEPSIS)
1000.0000 mL | Freq: Once | INTRAVENOUS | Status: AC
  Administered 2015-05-27: 1000 mL via INTRAVENOUS

## 2015-05-27 MED ORDER — OMEPRAZOLE 20 MG PO CPDR: 20.0000 mg | DELAYED_RELEASE_CAPSULE | Freq: Every day | ORAL | Status: DC

## 2015-05-27 MED ORDER — POLYETHYLENE GLYCOL 3350 17 GM/SCOOP PO POWD: 17.0000 g | Freq: Every day | ORAL | Status: DC

## 2015-05-27 MED ORDER — IOHEXOL 300 MG/ML  SOLN
25.0000 mL | Freq: Once | INTRAMUSCULAR | Status: AC | PRN
  Administered 2015-05-27: 25 mL via ORAL

## 2015-05-27 MED ORDER — IOHEXOL 300 MG/ML  SOLN
100.0000 mL | Freq: Once | INTRAMUSCULAR | Status: AC | PRN
  Administered 2015-05-27: 100 mL via INTRAVENOUS

## 2015-05-27 MED ORDER — FLUCONAZOLE 200 MG PO TABS: 200.0000 mg | ORAL_TABLET | Freq: Every day | ORAL | Status: AC

## 2015-05-27 MED ORDER — POTASSIUM CHLORIDE CRYS ER 20 MEQ PO TBCR
40.0000 meq | EXTENDED_RELEASE_TABLET | Freq: Once | ORAL | Status: AC
  Administered 2015-05-27: 40 meq via ORAL
  Filled 2015-05-27: qty 2

## 2015-05-27 NOTE — ED Provider Notes (Signed)
CSN: [id number]     Arrival date & time 05/27/15  1419 History   First MD Initiated Contact with Patient 05/27/15 1428     Chief Complaint  Patient presents with  . Abdominal Pain     (Consider location/radiation/quality/duration/timing/severity/associated sxs/prior Treatment) HPI Comments: Patients with history of HIV/AIDS, last CD4 count was 10 in 1/16, noncompliance with antiretrovirals -- presents with several weeks of ongoing upper abdominal pains. Pain is worse after eating especially when he stands up after eating. He describes it as a heaviness like there are "rocks in my stomach". He denies any vomiting or regurgitation. He has had fairly regular diarrhea, nonbloody. No history of abdominal surgeries. No fevers, vomiting, shortness of breath, chest pain, urinary symptoms. No skin rash. Patient was seen in the emergency department on 10/11 with the same symptoms. At that time he refused blood work and imaging stating that he did not want any needle sticks. Patient is willing to proceed with workup today.  The history is provided by the patient and medical records.    Past Medical History  Diagnosis Date  . HIV (human immunodeficiency virus infection) [facility])    Past Surgical History  Procedure Laterality Date  . Lung surgery     History reviewed. No pertinent family history. Social History  Substance Use Topics  . Smoking status: Current Every Day Smoker -- 1.00 packs/day    Types: Cigarettes  . Smokeless tobacco: None  . Alcohol Use: Yes    Review of Systems  Constitutional: Negative for fever.  HENT: Negative for rhinorrhea and sore throat.   Eyes: Negative for redness.  Respiratory: Negative for cough.   Cardiovascular: Negative for chest pain.  Gastrointestinal: Positive for nausea, abdominal pain and diarrhea. Negative for vomiting, constipation and blood in stool.  Genitourinary: Negative for dysuria.  Musculoskeletal: Negative for myalgias.  Skin: Negative for  rash.  Neurological: Negative for headaches.    Allergies  Review of patient's allergies indicates no known allergies.  Home Medications   Prior to Admission medications   Not on File   BP 117/75 mmHg  Pulse 112  Temp(Src) 99.1 F (37.3 C) (Oral)  Resp 16  Ht 5\' 7"  (1.702 m)  Wt 148 lb (67.132 kg)  BMI 23.17 kg/m2  SpO2 100%   Physical Exam  Constitutional: He appears well-developed and well-nourished.  HENT:  Head: Normocephalic and atraumatic.  Nose: Nose normal. No mucosal edema.  Mouth/Throat: Oral lesions present. Posterior oropharyngeal erythema present. No oropharyngeal exudate or posterior oropharyngeal edema.  Thrush noted in posterior pharynx.   Eyes: Conjunctivae are normal. Right eye exhibits no discharge. Left eye exhibits no discharge.  Neck: Normal range of motion. Neck supple.  Cardiovascular: Regular rhythm and normal heart sounds.  Tachycardia present.   No murmur heard. Pulmonary/Chest: Effort normal and breath sounds normal. No respiratory distress. He has no wheezes. He has no rales.  Abdominal: Soft. Bowel sounds are normal. There is tenderness. There is no rebound and no guarding.  Neurological: He is alert.  Skin: Skin is warm and dry.  Warm extremities.   Psychiatric: He has a normal mood and affect.  Nursing note and vitals reviewed.   ED Course  Procedures (including critical care time) Labs Review Labs Reviewed  CBC WITH DIFFERENTIAL/PLATELET - Abnormal; Notable for the following:    RBC 3.03 (*)    Hemoglobin 7.3 (*)    HCT 23.1 (*)    MCV 76.2 (*)    MCH 24.1 (*)  RDW 19.2 (*)    Lymphs Abs 0.2 (*)    All other components within normal limits  COMPREHENSIVE METABOLIC PANEL - Abnormal; Notable for the following:    Sodium 134 (*)    Potassium 3.0 (*)    Creatinine, Ser 0.48 (*)    Calcium 7.1 (*)    Total Protein 5.1 (*)    Albumin 1.9 (*)    All other components within normal limits  URINALYSIS, ROUTINE W REFLEX  MICROSCOPIC (NOT AT [facility]) - Abnormal; Notable for the following:    Urobilinogen, UA 2.0 (*)    All other components within normal limits  LIPASE, BLOOD    Imaging Review Ct Abdomen Pelvis W Contrast  05/27/2015  CLINICAL DATA:  Pain in the lower abdomen for a few days. EXAM: CT ABDOMEN AND PELVIS WITH CONTRAST TECHNIQUE: Multidetector CT imaging of the abdomen and pelvis was performed using the standard protocol following bolus administration of intravenous contrast. CONTRAST:  16mL OMNIPAQUE IOHEXOL 300 MG/ML SOLN, 167mL OMNIPAQUE IOHEXOL 300 MG/ML SOLN COMPARISON:  None. FINDINGS: Lower chest and abdominal wall: Extensive airway opacification in the bilateral lower lobes with volume loss and airspace opacity. Clustered, inflammatory appearing airspace nodules present in the right middle lobe. Body wall edema. Hepatobiliary: No focal liver abnormality.No evidence of biliary obstruction or stone. Pancreas: Unremarkable. Spleen: Unremarkable. Adrenals/Urinary Tract: Negative adrenals. No hydronephrosis or stone. Unremarkable bladder. Reproductive:No pathologic findings. Stomach/Bowel: Formed stool present through out most colonic segments. Fluid within small bowel loops without transition point to suggest obstruction. No bowel wall thickening. No appendicitis. Vascular/Lymphatic: No acute vascular abnormality. Age advanced aortic and branch vessel atherosclerotic calcification. There are diffusely mildly enlarged retroperitoneal and mesenteric lymph nodes without cavitation. Diffuse retroperitoneal haziness, likely third-spacing. Peritoneal: Trace ascites.  No pneumoperitoneum. Musculoskeletal: No acute abnormalities. Lumbar degenerative disc narrowing that is greatest at L3-4 and L4-5. IMPRESSION: 1. No acute finding. 2. Moderate to large retained stool. 3. Mildly enlarged mesenteric, retroperitoneal, and inguinal lymph nodes which could be from patient's HIV, other systemic infection, or  lymphoproliferative disease. 4. Airway debris and atelectasis in lower lobes, greater on the left. There may be superimposed pneumonia. Electronically Signed   By: Monte Fantasia M.D.   On: 05/27/2015 17:01   I have personally reviewed and evaluated these images and lab results as part of my medical decision-making.   EKG Interpretation None       3:00 PM Patient seen and examined. Work-up initiated.    Vital signs reviewed and are as follows: BP 117/75 mmHg  Pulse 112  Temp(Src) 99.1 F (37.3 C) (Oral)  Resp 16  Ht 5\' 7"  (1.702 m)  Wt 148 lb (67.132 kg)  BMI 23.17 kg/m2  SpO2 100%  I discussed results with Dr. Dayna Barker.   I discussed with the results with the patient and his mother at bedside. I encouraged the patient to stay for hospitalization however he is not interested. Discharged home with Diflucan, omeprazole, MiraLAX. Patient states that he will follow-up with his primary care physician in 3 days. I strongly encouraged him to do so and return to the emergency department if he decides that he would like treatment for this. I encouraged patient to return if he has worsening lightheadedness, syncope, chest pain, shortness of breath, weakness.    MDM   Final diagnoses:  Epigastric pain  Thrush  Constipation, unspecified constipation type  Anemia, unspecified anemia type   Patient with ongoing epigastric abdominal pain, found to have constipation on CT today.  Also found to have signs and symptoms of anemia on exam with a hemoglobin of 7.3.  Patient is very resistant to care today. I had to spend time convincing him to proceed with CT scan of his abdomen and blood work. We discussed all results including his low red blood cell count. I offered hospitalization to reinitiate antiretroviral therapy, further evaluate his ongoing abdominal pain and assess for GI bleeding, treat his anemia. Patient is not interested in this today. He is adamant that he wants to be treated as an  outpatient and follow-up with his doctor in 3 days. Mother at bedside also encouraging him to stay, however patient is not interested. 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 I also discussed with the patient that he is severely immunocompromised and is at risk for opportunistic infection which may cause serious morbidity or even death. Patient appears to be of sound mind and judgment. He has insight into his medical condition. Still he continues to refuse care or hospitalization today. I offered to call the patient's PCP office [facility]) and schedule an appointment for him however patient states that he will do this himself tomorrow. I told the patient that he is welcome to return at any time if he decides that he would like treatment and further evaluation of his symptoms.  It is quite possible that the patient has an occult GI bleed due to gastritis, potentially candidal esophagitis/gastritis. He does have thrush in his mouth. I've prescribed 3 weeks of Diflucan for this. Patient also was given omeprazole as well as MiraLAX.        Carlisle Cater, PA-C 05/27/15 1751  Merrily Pew, MD 05/27/15 [phone]

## 2015-05-27 NOTE — Discharge Instructions (Signed)
Please read and follow all provided instructions.  Your diagnoses today include:  1. Epigastric pain   2. Thrush   3. Constipation, unspecified constipation type   4. Anemia, unspecified anemia type     Tests performed today include:  Blood counts and electrolytes - shows very low red blood cell count  Blood tests to check liver and kidney function  Urine test to look for infection  CT scan shows that you have constipation, no severe infections  Vital signs. See below for your results today.   Medications prescribed:   Diflucan - medication for yeast in throat   Miralax - laxative  This medication can be found over-the-counter.    Omeprazole (Prilosec) - stomach acid reducer  This medication can be found over-the-counter  Take any prescribed medications only as directed.  Home care instructions:   Follow any educational materials contained in this packet.  Follow-up instructions: Please follow-up with your primary care provider in the next 3 days for further evaluation of your symptoms.    Return instructions:  SEEK IMMEDIATE MEDICAL ATTENTION IF:  The pain does not go away or becomes severe   A temperature above 101F develops   Repeated vomiting occurs (multiple episodes)   The pain becomes localized to portions of the abdomen. The right side could possibly be appendicitis. In an adult, the left lower portion of the abdomen could be colitis or diverticulitis.   Blood is being passed in stools or vomit (bright red or black tarry stools)   You develop chest pain, difficulty breathing, dizziness or fainting, or become confused, poorly responsive, or inconsolable (young children)  If you have any other emergent concerns regarding your health  Additional Information: Abdominal (belly) pain can be caused by many things. Your caregiver performed an examination and possibly ordered blood/urine tests and imaging (CT scan, x-rays, ultrasound). Many cases can be  observed and treated at home after initial evaluation in the emergency department. Even though you are being discharged home, abdominal pain can be unpredictable. Therefore, you need a repeated exam if your pain does not resolve, returns, or worsens. Most patients with abdominal pain don't have to be admitted to the hospital or have surgery, but serious problems like appendicitis and gallbladder attacks can start out as nonspecific pain. Many abdominal conditions cannot be diagnosed in one visit, so follow-up evaluations are very important.  Your vital signs today were: BP 134/92 mmHg   Pulse 93   Temp(Src) 99.1 F (37.3 C) (Oral)   Resp 16   Ht 5\' 7"  (1.702 m)   Wt 148 lb (67.132 kg)   BMI 23.17 kg/m2   SpO2 98% If your blood pressure (bp) was elevated above 135/85 this visit, please have this repeated by your doctor within one month. --------------

## 2015-05-27 NOTE — ED Notes (Signed)
Patient states that he is having stomach issues x 2 weeks,. Then point to his mid chest and states "it feels like my food gets lodged here and will not go down, then sits like a rock in my stomach". Reports Frequent loose stools.

## 2015-05-29 ENCOUNTER — Emergency Department (HOSPITAL_COMMUNITY)
Admission: EM | Admit: 2015-05-29 | Discharge: 2015-05-29 | Disposition: A | Discharge status: Home / Self Care | Payer: BLUE CROSS/BLUE SHIELD | Attending: Emergency Medicine | Admitting: Emergency Medicine

## 2015-05-29 ENCOUNTER — Encounter (HOSPITAL_COMMUNITY): Payer: Self-pay | Admitting: *Deleted

## 2015-05-29 DIAGNOSIS — R531 Weakness: Secondary | ICD-10-CM | POA: Diagnosis present

## 2015-05-29 DIAGNOSIS — Z79899 Other long term (current) drug therapy: Secondary | ICD-10-CM | POA: Insufficient documentation

## 2015-05-29 DIAGNOSIS — R5383 Other fatigue: Secondary | ICD-10-CM | POA: Insufficient documentation

## 2015-05-29 DIAGNOSIS — R000 Tachycardia, unspecified: Secondary | ICD-10-CM | POA: Insufficient documentation

## 2015-05-29 DIAGNOSIS — R42 Dizziness and giddiness: Secondary | ICD-10-CM | POA: Insufficient documentation

## 2015-05-29 DIAGNOSIS — Z720 Tobacco use: Secondary | ICD-10-CM | POA: Insufficient documentation

## 2015-05-29 DIAGNOSIS — Z21 Asymptomatic human immunodeficiency virus [HIV] infection status: Secondary | ICD-10-CM | POA: Diagnosis not present

## 2015-05-29 LAB — URINALYSIS, ROUTINE W REFLEX MICROSCOPIC
GLUCOSE, UA: NEGATIVE mg/dL
Hgb urine dipstick: NEGATIVE
Ketones, ur: NEGATIVE mg/dL
LEUKOCYTES UA: NEGATIVE
NITRITE: NEGATIVE
PH: 6 (ref 5.0–8.0)
Protein, ur: 30 mg/dL — AB
Specific Gravity, Urine: 1.026 (ref 1.005–1.030)
UROBILINOGEN UA: 1 mg/dL (ref 0.0–1.0)

## 2015-05-29 LAB — CBC WITH DIFFERENTIAL/PLATELET
BASOS ABS: 0 10*3/uL (ref 0.0–0.1)
Basophils Relative: 0 %
EOS PCT: 1 %
Eosinophils Absolute: 0 10*3/uL (ref 0.0–0.7)
HEMATOCRIT: 26 % — AB (ref 39.0–52.0)
HEMOGLOBIN: 8.1 g/dL — AB (ref 13.0–17.0)
LYMPHS ABS: 0.2 10*3/uL — AB (ref 0.7–4.0)
Lymphocytes Relative: 7 %
MCH: 23.4 pg — AB (ref 26.0–34.0)
MCHC: 31.2 g/dL (ref 30.0–36.0)
MCV: 75.1 fL — ABNORMAL LOW (ref 78.0–100.0)
MONO ABS: 0.2 10*3/uL (ref 0.1–1.0)
MONOS PCT: 6 %
NEUTROS ABS: 3.1 10*3/uL (ref 1.7–7.7)
Neutrophils Relative %: 86 %
Platelets: 180 10*3/uL (ref 150–400)
RBC: 3.46 MIL/uL — AB (ref 4.22–5.81)
RDW: 19.4 % — AB (ref 11.5–15.5)
WBC: 3.5 10*3/uL — AB (ref 4.0–10.5)

## 2015-05-29 LAB — COMPREHENSIVE METABOLIC PANEL
ALBUMIN: 1.7 g/dL — AB (ref 3.5–5.0)
ALK PHOS: 57 U/L (ref 38–126)
ALT: 22 U/L (ref 17–63)
ANION GAP: 8 (ref 5–15)
AST: 41 U/L (ref 15–41)
BILIRUBIN TOTAL: 0.2 mg/dL — AB (ref 0.3–1.2)
BUN: 9 mg/dL (ref 6–20)
CALCIUM: 7.5 mg/dL — AB (ref 8.9–10.3)
CO2: 22 mmol/L (ref 22–32)
Chloride: 106 mmol/L (ref 101–111)
Creatinine, Ser: 0.82 mg/dL (ref 0.61–1.24)
GFR calc non Af Amer: >60 mL/min (ref >60)
Glucose, Bld: 99 mg/dL (ref 65–99)
POTASSIUM: 3.5 mmol/L (ref 3.5–5.1)
Sodium: 136 mmol/L (ref 135–145)
TOTAL PROTEIN: 4.9 g/dL — AB (ref 6.5–8.1)

## 2015-05-29 LAB — URINE MICROSCOPIC-ADD ON

## 2015-05-29 LAB — TYPE AND SCREEN
ABO/RH(D): O POS
ANTIBODY SCREEN: NEGATIVE

## 2015-05-29 LAB — ABO/RH: ABO/RH(D): O POS

## 2015-05-29 MED ORDER — SODIUM CHLORIDE 0.9 % IV BOLUS (SEPSIS)
1000.0000 mL | Freq: Once | INTRAVENOUS | Status: AC
  Administered 2015-05-29: 1000 mL via INTRAVENOUS

## 2015-05-29 MED ORDER — FERROUS SULFATE 325 (65 FE) MG PO TABS: 325.0000 mg | ORAL_TABLET | Freq: Every day | ORAL | Status: DC

## 2015-05-29 NOTE — ED Notes (Signed)
Patient refused x ray

## 2015-05-29 NOTE — ED Notes (Addendum)
Patient states that he does not want any labs drawn and would like to wait for the provider to see him as he already had them done.

## 2015-05-29 NOTE — Discharge Instructions (Signed)
1. Medications: iron, usual home medications 2. Treatment: rest, drink plenty of fluids 3. Follow Up: please followup with your primary doctor this week for discussion of your diagnoses and further evaluation after today's visit; if you do not have a primary care doctor use the resource guide provided to find one; please return to the ER for high fever, loss of consciousness, chest pain, shortness of breath, new or worsening symptoms   Fatigue Fatigue is feeling tired all of the time, a lack of energy, or a lack of motivation. Occasional or mild fatigue is often a normal response to activity or life in general. However, long-lasting (chronic) or extreme fatigue may indicate an underlying medical condition. HOME CARE INSTRUCTIONS  Watch your fatigue for any changes. The following actions may help to lessen any discomfort you are feeling:  Talk to your health care provider about how much sleep you need each night. Try to get the required amount every night.  Take medicines only as directed by your health care provider.  Eat a healthy and nutritious diet. Ask your health care provider if you need help changing your diet.  Drink enough fluid to keep your urine clear or pale yellow.  Practice ways of relaxing, such as yoga, meditation, massage therapy, or acupuncture.  Exercise regularly.   Change situations that cause you stress. Try to keep your work and personal routine reasonable.  Do not abuse illegal drugs.  Limit alcohol intake to no more than 1 drink per day for nonpregnant women and 2 drinks per day for men. One drink equals 12 ounces of beer, 5 ounces of wine, or 1 ounces of hard liquor.  Take a multivitamin, if directed by your health care provider. SEEK MEDICAL CARE IF:   Your fatigue does not get better.  You have a fever.   You have unintentional weight loss or gain.  You have headaches.   You have difficulty:   Falling asleep.  Sleeping throughout the  night.  You feel angry, guilty, anxious, or sad.   You are unable to have a bowel movement (constipation).   You skin is dry.   Your legs or another part of your body is swollen.  SEEK IMMEDIATE MEDICAL CARE IF:   You feel confused.   Your vision is blurry.  You feel faint or pass out.   You have a severe headache.   You have severe abdominal, pelvic, or back pain.   You have chest pain, shortness of breath, or an irregular or fast heartbeat.   You are unable to urinate or you urinate less than normal.   You develop abnormal bleeding, such as bleeding from the rectum, vagina, nose, lungs, or nipples.  You vomit blood.   You have thoughts about harming yourself or committing suicide.   You are worried that you might harm someone else.    This information is not intended to replace advice given to you by your health care provider. Make sure you discuss any questions you have with your health care provider.   Document Released: 05/16/2007 Document Revised: 08/09/2014 Document Reviewed: 11/20/2013 Elsevier Interactive Patient Education 2016 Elsevier Inc.  Weakness Weakness is a lack of strength. It may be felt all over the body (generalized) or in one specific part of the body (focal). Some causes of weakness can be serious. You may need further medical evaluation, especially if you are elderly or you have a history of immunosuppression (such as chemotherapy or HIV), kidney disease, heart disease, or  diabetes. CAUSES  Weakness can be caused by many different things, including:  Infection.  Physical exhaustion.  Internal bleeding or other blood loss that results in a lack of red blood cells (anemia).  Dehydration. This cause is more common in elderly people.  Side effects or electrolyte abnormalities from medicines, such as pain medicines or sedatives.  Emotional distress, anxiety, or depression.  Circulation problems, especially severe peripheral  arterial disease.  Heart disease, such as rapid atrial fibrillation, bradycardia, or heart failure.  Nervous system disorders, such as Guillain-Barr syndrome, multiple sclerosis, or stroke. DIAGNOSIS  To find the cause of your weakness, your caregiver will take your history and perform a physical exam. Lab tests or X-rays may also be ordered, if needed. TREATMENT  Treatment of weakness depends on the cause of your symptoms and can vary greatly. HOME CARE INSTRUCTIONS   Rest as needed.  Eat a well-balanced diet.  Try to get some exercise every day.  Only take over-the-counter or prescription medicines as directed by your caregiver. SEEK MEDICAL CARE IF:   Your weakness seems to be getting worse or spreads to other parts of your body.  You develop new aches or pains. SEEK IMMEDIATE MEDICAL CARE IF:   You cannot perform your normal daily activities, such as getting dressed and feeding yourself.  You cannot walk up and down stairs, or you feel exhausted when you do so.  You have shortness of breath or chest pain.  You have difficulty moving parts of your body.  You have weakness in only one area of the body or on only one side of the body.  You have a fever.  You have trouble speaking or swallowing.  You cannot control your bladder or bowel movements.  You have black or bloody vomit or stools. MAKE SURE YOU:  Understand these instructions.  Will watch your condition.  Will get help right away if you are not doing well or get worse.   This information is not intended to replace advice given to you by your health care provider. Make sure you discuss any questions you have with your health care provider.   Document Released: 07/19/2005 Document Revised: 01/18/2012 Document Reviewed: 09/17/2011 Elsevier Interactive Patient Education 2016 [employer].   Emergency Department Resource Guide 1) Find a Doctor and Pay Out of Pocket Although you won't have to find out  who is covered by your insurance plan, it is a good idea to ask around and get recommendations. You will then need to call the office and see if the doctor you have chosen will accept you as a new patient and what types of options they offer for patients who are self-pay. Some doctors offer discounts or will set up payment plans for their patients who do not have insurance, but you will need to ask so you aren't surprised when you get to your appointment.  2) Contact Your Local Health Department Not all health departments have doctors that can see patients for sick visits, but many do, so it is worth a call to see if yours does. If you don't know where your local health department is, you can check in your phone book. The CDC also has a tool to help you locate your state's health department, and many state websites also have listings of all of their local health departments.  3) Find a [facility] If your illness is not likely to be very severe or complicated, you may want to try a walk in clinic.  These are popping up all over the country in pharmacies, drugstores, and shopping centers. They're usually staffed by nurse practitioners or physician assistants that have been trained to treat common illnesses and complaints. They're usually fairly quick and inexpensive. However, if you have serious medical issues or chronic medical problems, these are probably not your best option.  No Primary Care Doctor: - Call Health Connect at  [phone] - they can help you locate a primary care doctor that  accepts your insurance, provides certain services, etc. - Physician Referral Service- [phone]  Chronic Pain Problems: Organization         Address  Phone   Notes  [facility]  [phone] Patients need to be referred by their primary care doctor.   Medication Assistance: Organization         Address  Phone   Notes  [facility] Medication [facility] [address] [place], [place] [address] [phone] --Must be a resident of [facility] -- Must have NO insurance coverage whatsoever (no Medicaid/ Medicare, etc.) -- The pt. MUST have a primary care doctor that directs their care regularly and follows them in the community   MedAssist  [phone]   [employer]  [phone]    Agencies that provide inexpensive medical care: Organization         Address  Phone   Notes  [place]  [phone]   [facility]    [phone]   [facility] [place], [place] [address] [phone]   [place] [address], [place] [phone]   Planned Parenthood    [phone]   [facility]    [phone]   [place] and [place] [address], [place] Phone:  [phone], Fax:  [phone] Hours of Operation:  9 am - 6 pm, M-F.  Also accepts Medicaid/Medicare and self-pay.  [facility] for [place], [address], [place] Phone: [phone], Fax: [phone]. Hours of Operation:  8:30 am - 5:30 pm, M-F.  Also accepts Medicaid and self-pay.  [facility] [place] [address], [place] Phone: [phone]   [place], [place], [place] [phone], Ext. [phone] Mondays & Thursdays: 7-9 AM.  First 15 patients are seen on a first come, first serve basis.    [place] Providers:  Organization         Address  Phone   Notes  [facility] [address], [place] [phone] Also accepts self-pay patients.  [facility] [address], [place]  [phone]   [place], [address], [place] [phone]   [facility] [address] [place] [phone]   Lucianne Lei  [address], [place]   [phone] Only accepts [place] Access [place] patients after they have their name applied to their card.   Self-Pay (no insurance) in [facility]:  Organization         Address  Phone   Notes  Sickle Cell Patients, [facility] Internal [facility] [place] [phone]   [facility] [place] [phone]   [facility]  [place]  [address],  [phone]   [facility]/Dr. Osei-Bonsu  [address] [place] or [address], [place] [phone] Phone number for both [place] and [place] locations is the same.  [facility] [address], [place] [phone]   [facility] [address] [place] or [address] [phone]   [facility] [address] [place] [phone], phone; [phone], fax Sees patients 1st and 3rd Saturday of every month.  Must not qualify for public or private insurance (i.e. Medicaid, Medicare, [place] Health Choice, Veterans' Benefits)  Household income should be no more than 200% of the poverty level The clinic cannot treat you if you are pregnant or think you are pregnant  Sexually transmitted diseases are not treated at the clinic.    Dental Care: Organization         Address  Phone  Notes  [facility] Department [facility] [place] [phone] Accepts children up to age 82 who are enrolled in [place] or [place]; pregnant women with a Medicaid card; and children who have applied for Medicaid or [place] Health Choice, but were declined, whose parents can pay a reduced fee at time of service.  [facility] Department of [facility]  [address], [place] [phone] Accepts children up to age 74  who are enrolled in [place] or [place]; pregnant women with a Medicaid card; and children who have applied for Medicaid or [place] Health Choice, but were declined, whose parents can pay a reduced fee at time of service.  [place] Adult Dental Access PROGRAM  [place] [phone] Patients are seen by appointment only. Walk-ins are not accepted. [place] will see patients 64 years of age and older. Monday - Tuesday (8am-5pm) Most Wednesdays (8:30-5pm) $30 per visit, cash only  [facility] Adult Dental Access PROGRAM  [address], [facility] [phone] Patients are seen by appointment only. Walk-ins are not accepted. [place] will see patients 52 years of age and older. One Wednesday Evening (Monthly: Volunteer Based).  $30 per visit, cash only  [place]  [phone] for adults; Children under age 68, call Graduate Pediatric Dentistry at [phone]. Children aged 108-14, please call [phone] to request a pediatric application.  Dental services are provided in all areas of dental care including fillings, crowns and bridges, complete and partial dentures, implants, gum treatment, root canals, and extractions. Preventive care is also provided. Treatment is provided to both adults and children. Patients are selected via a lottery and there is often a waiting list.   [facility] [address] [place]  [phone] [email or web address]   [facility] [address] [place], [place] [phone], Ext. [phone] Second and Fourth Thursday of each month, opens at 6:30 AM; Clinic ends at 9 AM.  Patients are seen on a first-come first-served basis, and a limited number are seen during each clinic.   [facility]  [address] Hillard Danker [place], [place] [phone]   Eligibility Requirements You must have lived in [place], [place], or [place] counties for at least the last three months.    You cannot be eligible for state or federal sponsored [employer], including [employer]  Administration, Medicaid, or Medicare.   You generally cannot be eligible for healthcare insurance through your employer.    How to apply: Eligibility screenings are held every Tuesday and Wednesday afternoon from 1:00 pm until 4:00 pm. You do not need an appointment for the interview!  [facility] [address] [place], [place]   [place]  [place] Department  [place]  [phone]    Behavioral Health Resources in the Community: Intensive Outpatient Programs Organization         Address  Phone  Notes  [place]. [address] [place], [place] [phone]   [facility] Outpatient [address], [place], [place]   ADS: Alcohol [facility] Drug S[facility] [address], [place], [place]   [place] [address]  [place], [place] or [phone]   Substance Abuse Resources Organization         Address  Phone  Notes  Al[facility] Drug Services  [phone]   [place]  [phone]   [facility] [place]   Chinita Pester  [phone]   Residential & Outpatient Substance Abuse Program  [phone]   Psychological Services Organization         Address  Phone  Notes  [facility]  [place]  [phone]   [place] [address], [place] or [phone]    Mobile Crisis Teams Organization         Address  Phone  Notes  [facility], Mobile Crisis Care Unit  [phone]   [facility]  [address] [place], [place]   [place] [address] [place] [phone]    Self-Help/Support  Groups Organization         Address  Phone             Notes  [place]. of [place] - variety of support groups  [place] Call for more information  Narcotics Anonymous (NA), [facility] [address], [employer] [place]  2 meetings at this location   Special educational needs teacher         Address  Phone  Notes  [facility] [place],    [place]  [phone]   [facility]  [address] [place] [id number], [place], [place]   [place], [place] [phone] Admissions: 8am-3pm M-F  Incentives Substance [place] [address],    [place], [place] [phone]   [facility] [address] [place], [place], [place]   [facility] [address],  [place], [place]   [facility] - Intensive Outpatient [address], [place], [place]   [facility] ([place].,  [place], [place] [phone] or [phone]   [facility] ([facility]) [address] [place], [place] Accepts Medicaid  [facility] [place] [address],  [place] [phone] Substance Abuse/Addiction Treatment   [facility] Organization         Address  Phone  Notes  [facility]  [phone]   Domenic Schwab, PhD [address] [place], [place]   [phone] or [phone]  [facility]   [address] [place], [place] [phone]   [place] [address], [place], [place] [phone] Insurance/Medicaid/sponsorship through [facility] and Families [address], [address] [place], [place] [phone] [place], [place] [phone]    Dr. Adele Schilder  [phone]   [facility] Dept. 1) [address]  2) [place] 3)  [address], [place] [phone]  [phone]   [place] [phone] or [phone] (After Hours)

## 2015-05-29 NOTE — ED Notes (Addendum)
Patient reports he was seen at [facility] on the 25 for weakness and passing out. Patients hemoglogin was noted to be 7.8 however patient did not receive blood transfusion. Patient states he is back today to get the blood transfusion. Patient states he needs to see GI doc. Patient reports orthostatic changes and sob.

## 2015-05-29 NOTE — ED Provider Notes (Signed)
CSN: [id number]     Arrival date & time 05/29/15  1338 History   First MD Initiated Contact with Patient 05/29/15 1628     Chief Complaint  Patient presents with  . Weakness     HPI    Miguel Campos is a 44 y.o. male with a PMH of HIV who presents to the ED with generalized weakness and fatigue x 2-3 weeks. He reports his symptoms are constant. He denies exacerbating or alleviating factors. He denies headache. He reports lightheadedness and dizziness when he moves from sitting to standing. He denies cough, congestion, chest pain, shortness of breath, abdominal pain, N/V/D/C, melena, hematochezia, numbness, paresthesia. He states he was seen at [facility] on 10/25 and was told his hemoglobin was low and that he would require transfusion. He states he is not currently taking his HAART medications.    Past Medical History  Diagnosis Date  . HIV (human immunodeficiency virus infection) [facility])    Past Surgical History  Procedure Laterality Date  . Lung surgery     History reviewed. No pertinent family history. Social History  Substance Use Topics  . Smoking status: Current Every Day Smoker -- 1.00 packs/day    Types: Cigarettes  . Smokeless tobacco: None  . Alcohol Use: Yes     Review of Systems  Constitutional: Positive for fatigue. Negative for fever and chills.  HENT: Negative for congestion.   Respiratory: Negative for cough and shortness of breath.   Cardiovascular: Negative for chest pain.  Gastrointestinal: Negative for nausea, vomiting, abdominal pain, diarrhea, constipation and blood in stool.  Neurological: Positive for dizziness, weakness and light-headedness. Negative for syncope, numbness and headaches.  All other systems reviewed and are negative.     Allergies  Review of patient's allergies indicates no known allergies.  Home Medications   Prior to Admission medications   Medication Sig Start Date End Date Taking? Authorizing Provider  fluconazole (DIFLUCAN)  200 MG tablet Take 1 tablet (200 mg total) by mouth daily. 05/27/15 06/03/15 Yes Carlisle Cater, PA-C  omeprazole (PRILOSEC) 20 MG capsule Take 1 capsule (20 mg total) by mouth daily. 05/27/15  Yes Carlisle Cater, PA-C  polyethylene glycol powder (GLYCOLAX/MIRALAX) powder Take 17 g by mouth daily. 05/27/15  Yes Carlisle Cater, PA-C  ferrous sulfate 325 (65 FE) MG tablet Take 1 tablet (325 mg total) by mouth daily. 05/29/15   Mescal Flinchbaugh C Marwin Primmer, PA-C    BP 136/85 mmHg  Pulse 104  Temp(Src) 98.6 F (37 C) (Oral)  Resp 37  SpO2 95% Physical Exam  Constitutional: He is oriented to person, place, and time. No distress.  Chronically ill-appearing male in no acute distress.  HENT:  Head: Normocephalic and atraumatic.  Right Ear: External ear normal.  Left Ear: External ear normal.  Nose: Nose normal.  Mouth/Throat: Uvula is midline, oropharynx is clear and moist and mucous membranes are normal.  Eyes: Conjunctivae, EOM and lids are normal. Pupils are equal, round, and reactive to light. Right eye exhibits no discharge. Left eye exhibits no discharge. No scleral icterus.  Neck: Normal range of motion. Neck supple.  Cardiovascular: Regular rhythm, normal heart sounds, intact distal pulses and normal pulses.  Tachycardia present.   Pulmonary/Chest: Effort normal and breath sounds normal. No respiratory distress. He has no wheezes. He has no rales.  Abdominal: Soft. Normal appearance and bowel sounds are normal. He exhibits no distension and no mass. There is no tenderness. There is no rigidity, no rebound and no guarding.  Musculoskeletal: Normal  range of motion. He exhibits no edema or tenderness.  Neurological: He is alert and oriented to person, place, and time. He has normal strength. No cranial nerve deficit or sensory deficit.  Skin: Skin is warm, dry and intact. No rash noted. He is not diaphoretic. No erythema. No pallor.  Psychiatric: He has a normal mood and affect. His speech is normal  and behavior is normal.  Nursing note and vitals reviewed.   ED Course  Procedures (including critical care time)  Labs Review Labs Reviewed  CBC WITH DIFFERENTIAL/PLATELET - Abnormal; Notable for the following:    WBC 3.5 (*)    RBC 3.46 (*)    Hemoglobin 8.1 (*)    HCT 26.0 (*)    MCV 75.1 (*)    MCH 23.4 (*)    RDW 19.4 (*)    Lymphs Abs 0.2 (*)    All other components within normal limits  COMPREHENSIVE METABOLIC PANEL - Abnormal; Notable for the following:    Calcium 7.5 (*)    Total Protein 4.9 (*)    Albumin 1.7 (*)    Total Bilirubin 0.2 (*)    All other components within normal limits  URINALYSIS, ROUTINE W REFLEX MICROSCOPIC (NOT AT [facility]) - Abnormal; Notable for the following:    Color, Urine AMBER (*)    Bilirubin Urine SMALL (*)    Protein, ur 30 (*)    All other components within normal limits  URINE MICROSCOPIC-ADD ON - Abnormal; Notable for the following:    Casts HYALINE CASTS (*)    All other components within normal limits  TYPE AND SCREEN  ABO/RH    Imaging Review No results found.   I have personally reviewed and evaluated these lab results as part of my medical decision-making.   EKG Interpretation None      MDM   Final diagnoses:  Generalized weakness  Other fatigue    44 year old male presents with generalized weakness and fatigue for the past several weeks. Reports lightheadedness and dizziness when he moves from sitting to standing. Denies cough, congestion, chest pain, shortness of breath, abdominal pain, N/V/D/C, melena, hematochezia, numbness, paresthesia. States he was seen at [facility] on 10/25 and was told his hemoglobin was low and that he would require transfusion. Reports he is not currently taking his HAART medications.  Patient is afebrile. Mildly tachycardic. Heart regular rhythm. Lungs clear to auscultation bilaterally. Abdomen soft, non-tender, non-distended. Normal neuro exam with no focal deficit.   CBC remarkable for  WBC 3.5, hemoglobin 8.1 (improved from 7.3 10/25). CMP unremarkable. UA negative for infection. Patient given 1L NS in the ED.  ID consulted given low CD4 count in January. Spoke with ID, recommended patient follow-up in office for initiation of HAART. Patient refuses CXR and is requesting to leave. States he feels much better after fluids. Will give prescription for iron. Patient to follow-up with ID and PCP. Return precautions discussed at length.  Patient verbalizes his understanding and is in agreement with plan.  BP 136/85 mmHg  Pulse 104  Temp(Src) 98.6 F (37 C) (Oral)  Resp 37  SpO2 95%      Marella Chimes, PA-C 05/30/15 0057  Charlesetta Shanks, MD 06/03/15 [phone]

## 2015-05-29 NOTE — ED Notes (Signed)
Pt left with all his belongings and ambulated out of the treatment area.  

## 2015-06-03 DIAGNOSIS — D61818 Other pancytopenia: Secondary | ICD-10-CM

## 2015-06-03 HISTORY — DX: Other pancytopenia: D61.818

## 2015-06-20 ENCOUNTER — Encounter (HOSPITAL_BASED_OUTPATIENT_CLINIC_OR_DEPARTMENT_OTHER): Payer: Self-pay | Admitting: Emergency Medicine

## 2015-06-20 ENCOUNTER — Inpatient Hospital Stay (HOSPITAL_BASED_OUTPATIENT_CLINIC_OR_DEPARTMENT_OTHER)
Admission: EM | Admit: 2015-06-20 | Discharge: 2015-06-24 | DRG: 975 | Disposition: A | Discharge status: Home / Self Care | Payer: BLUE CROSS/BLUE SHIELD | Attending: Internal Medicine | Admitting: Internal Medicine

## 2015-06-20 ENCOUNTER — Emergency Department (HOSPITAL_BASED_OUTPATIENT_CLINIC_OR_DEPARTMENT_OTHER): Payer: BLUE CROSS/BLUE SHIELD

## 2015-06-20 DIAGNOSIS — I471 Supraventricular tachycardia, unspecified: Secondary | ICD-10-CM | POA: Diagnosis present

## 2015-06-20 DIAGNOSIS — J189 Pneumonia, unspecified organism: Secondary | ICD-10-CM | POA: Diagnosis not present

## 2015-06-20 DIAGNOSIS — I429 Cardiomyopathy, unspecified: Secondary | ICD-10-CM | POA: Diagnosis present

## 2015-06-20 DIAGNOSIS — A09 Infectious gastroenteritis and colitis, unspecified: Secondary | ICD-10-CM | POA: Diagnosis present

## 2015-06-20 DIAGNOSIS — D649 Anemia, unspecified: Secondary | ICD-10-CM

## 2015-06-20 DIAGNOSIS — I959 Hypotension, unspecified: Secondary | ICD-10-CM | POA: Diagnosis present

## 2015-06-20 DIAGNOSIS — Z720 Tobacco use: Secondary | ICD-10-CM | POA: Diagnosis present

## 2015-06-20 DIAGNOSIS — E872 Acidosis, unspecified: Secondary | ICD-10-CM | POA: Diagnosis present

## 2015-06-20 DIAGNOSIS — R778 Other specified abnormalities of plasma proteins: Secondary | ICD-10-CM

## 2015-06-20 DIAGNOSIS — M79606 Pain in leg, unspecified: Secondary | ICD-10-CM | POA: Diagnosis present

## 2015-06-20 DIAGNOSIS — E46 Unspecified protein-calorie malnutrition: Secondary | ICD-10-CM | POA: Diagnosis present

## 2015-06-20 DIAGNOSIS — Z9114 Patient's other noncompliance with medication regimen: Secondary | ICD-10-CM

## 2015-06-20 DIAGNOSIS — R7989 Other specified abnormal findings of blood chemistry: Secondary | ICD-10-CM

## 2015-06-20 DIAGNOSIS — R636 Underweight: Secondary | ICD-10-CM | POA: Diagnosis present

## 2015-06-20 DIAGNOSIS — I5043 Acute on chronic combined systolic (congestive) and diastolic (congestive) heart failure: Secondary | ICD-10-CM | POA: Diagnosis not present

## 2015-06-20 DIAGNOSIS — Z8249 Family history of ischemic heart disease and other diseases of the circulatory system: Secondary | ICD-10-CM

## 2015-06-20 DIAGNOSIS — I5042 Chronic combined systolic (congestive) and diastolic (congestive) heart failure: Secondary | ICD-10-CM | POA: Diagnosis present

## 2015-06-20 DIAGNOSIS — B20 Human immunodeficiency virus [HIV] disease: Secondary | ICD-10-CM | POA: Diagnosis present

## 2015-06-20 DIAGNOSIS — R1013 Epigastric pain: Secondary | ICD-10-CM | POA: Diagnosis not present

## 2015-06-20 DIAGNOSIS — D61818 Other pancytopenia: Secondary | ICD-10-CM | POA: Diagnosis not present

## 2015-06-20 DIAGNOSIS — B59 Pneumocystosis: Secondary | ICD-10-CM | POA: Diagnosis present

## 2015-06-20 DIAGNOSIS — R197 Diarrhea, unspecified: Secondary | ICD-10-CM | POA: Diagnosis not present

## 2015-06-20 DIAGNOSIS — F17210 Nicotine dependence, cigarettes, uncomplicated: Secondary | ICD-10-CM | POA: Diagnosis present

## 2015-06-20 DIAGNOSIS — R591 Generalized enlarged lymph nodes: Secondary | ICD-10-CM | POA: Diagnosis present

## 2015-06-20 DIAGNOSIS — Z6821 Body mass index (BMI) 21.0-21.9, adult: Secondary | ICD-10-CM | POA: Diagnosis not present

## 2015-06-20 DIAGNOSIS — D509 Iron deficiency anemia, unspecified: Secondary | ICD-10-CM | POA: Diagnosis not present

## 2015-06-20 DIAGNOSIS — R0602 Shortness of breath: Secondary | ICD-10-CM

## 2015-06-20 DIAGNOSIS — D619 Aplastic anemia, unspecified: Secondary | ICD-10-CM | POA: Diagnosis present

## 2015-06-20 DIAGNOSIS — G8929 Other chronic pain: Secondary | ICD-10-CM | POA: Diagnosis present

## 2015-06-20 LAB — MAGNESIUM: Magnesium: 1.6 mg/dL — ABNORMAL LOW (ref 1.7–2.4)

## 2015-06-20 LAB — COMPREHENSIVE METABOLIC PANEL
ALT: 17 U/L (ref 17–63)
AST: 39 U/L (ref 15–41)
Albumin: 1.4 g/dL — ABNORMAL LOW (ref 3.5–5.0)
Alkaline Phosphatase: 119 U/L (ref 38–126)
Anion gap: 8 (ref 5–15)
BUN: 22 mg/dL — ABNORMAL HIGH (ref 6–20)
CO2: 23 mmol/L (ref 22–32)
Calcium: 7.4 mg/dL — ABNORMAL LOW (ref 8.9–10.3)
Chloride: 105 mmol/L (ref 101–111)
Creatinine, Ser: 0.97 mg/dL (ref 0.61–1.24)
GFR calc Af Amer: >60 mL/min (ref >60)
GFR calc non Af Amer: >60 mL/min (ref >60)
Glucose, Bld: 110 mg/dL — ABNORMAL HIGH (ref 65–99)
Potassium: 3.7 mmol/L (ref 3.5–5.1)
Sodium: 136 mmol/L (ref 135–145)
Total Bilirubin: 0.3 mg/dL (ref 0.3–1.2)
Total Protein: 4.9 g/dL — ABNORMAL LOW (ref 6.5–8.1)

## 2015-06-20 LAB — CBC WITH DIFFERENTIAL/PLATELET
Band Neutrophils: 1 %
Basophils Absolute: 0 10*3/uL (ref 0.0–0.1)
Basophils Relative: 0 %
Eosinophils Absolute: 0 10*3/uL (ref 0.0–0.7)
Eosinophils Relative: 0 %
HCT: 18.5 % — ABNORMAL LOW (ref 39.0–52.0)
Hemoglobin: 5.8 g/dL — CL (ref 13.0–17.0)
Lymphocytes Relative: 2 %
Lymphs Abs: 0.1 10*3/uL — ABNORMAL LOW (ref 0.7–4.0)
MCH: 22.9 pg — ABNORMAL LOW (ref 26.0–34.0)
MCHC: 31.4 g/dL (ref 30.0–36.0)
MCV: 73.1 fL — ABNORMAL LOW (ref 78.0–100.0)
Monocytes Absolute: 0.2 10*3/uL (ref 0.1–1.0)
Monocytes Relative: 5 %
Neutro Abs: 3.2 10*3/uL (ref 1.7–7.7)
Neutrophils Relative %: 92 %
Platelets: 191 10*3/uL (ref 150–400)
RBC: 2.53 MIL/uL — ABNORMAL LOW (ref 4.22–5.81)
RDW: 21.1 % — ABNORMAL HIGH (ref 11.5–15.5)
WBC: 3.5 10*3/uL — ABNORMAL LOW (ref 4.0–10.5)

## 2015-06-20 LAB — FERRITIN: Ferritin: 1183 ng/mL — ABNORMAL HIGH (ref 24–336)

## 2015-06-20 LAB — IRON AND TIBC: Iron: 17 ug/dL — ABNORMAL LOW (ref 45–182)

## 2015-06-20 LAB — T-HELPER CELLS (CD4) COUNT (NOT AT ARMC)
CD4 % Helper T Cell: 1 % — ABNORMAL LOW (ref 33–55)
CD4 T Cell Abs: <10 /uL — ABNORMAL LOW (ref 400–2700)

## 2015-06-20 LAB — I-STAT CG4 LACTIC ACID, ED: LACTIC ACID, VENOUS: 3.05 mmol/L — AB (ref 0.5–2.0)

## 2015-06-20 LAB — PREPARE RBC (CROSSMATCH)

## 2015-06-20 LAB — RETICULOCYTES
RBC.: 2.58 MIL/uL — ABNORMAL LOW (ref 4.22–5.81)
Retic Count, Absolute: 33.5 10*3/uL (ref 19.0–186.0)
Retic Ct Pct: 1.3 % (ref 0.4–3.1)

## 2015-06-20 LAB — PHOSPHORUS: PHOSPHORUS: 3.3 mg/dL (ref 2.5–4.6)

## 2015-06-20 LAB — FOLATE: Folate: 5.1 ng/mL — ABNORMAL LOW (ref >5.9)

## 2015-06-20 LAB — PROTIME-INR
INR: 1.19 (ref 0.00–1.49)
Prothrombin Time: 15.3 seconds — ABNORMAL HIGH (ref 11.6–15.2)

## 2015-06-20 LAB — TROPONIN I
Troponin I: 0.09 ng/mL — ABNORMAL HIGH (ref <0.031)
Troponin I: 0.04 ng/mL — ABNORMAL HIGH (ref <0.031)

## 2015-06-20 LAB — BRAIN NATRIURETIC PEPTIDE: B Natriuretic Peptide: 243.8 pg/mL — ABNORMAL HIGH (ref 0.0–100.0)

## 2015-06-20 LAB — LACTIC ACID, PLASMA: Lactic Acid, Venous: 2.6 mmol/L (ref 0.5–2.0)

## 2015-06-20 LAB — LACTATE DEHYDROGENASE
LDH: 371 U/L — ABNORMAL HIGH (ref 98–192)
LDH: 154 U/L (ref 98–192)

## 2015-06-20 LAB — MRSA PCR SCREENING: MRSA by PCR: NEGATIVE

## 2015-06-20 LAB — TSH: TSH: 4.009 u[IU]/mL (ref 0.350–4.500)

## 2015-06-20 LAB — VITAMIN B12: Vitamin B-12: 1479 pg/mL — ABNORMAL HIGH (ref 180–914)

## 2015-06-20 MED ORDER — HYDROMORPHONE HCL 1 MG/ML IJ SOLN
1.0000 mg | INTRAMUSCULAR | Status: DC | PRN
  Administered 2015-06-20 – 2015-06-22 (×7): 1 mg via INTRAVENOUS
  Filled 2015-06-20 (×8): qty 1

## 2015-06-20 MED ORDER — MAGNESIUM SULFATE 2 GM/50ML IV SOLN
2.0000 g | Freq: Once | INTRAVENOUS | Status: AC
  Administered 2015-06-20: 2 g via INTRAVENOUS
  Filled 2015-06-20: qty 50

## 2015-06-20 MED ORDER — SODIUM CHLORIDE 0.9 % IV SOLN
INTRAVENOUS | Status: DC
  Administered 2015-06-20: 18:00:00 via INTRAVENOUS

## 2015-06-20 MED ORDER — DTAP-HEPATITIS B RECOMB-IPV IM SUSP
0.5000 mL | Freq: Once | INTRAMUSCULAR | Status: DC
Start: 2015-06-20 — End: 2015-06-20

## 2015-06-20 MED ORDER — SODIUM CHLORIDE 0.9 % IV BOLUS (SEPSIS): 1000.0000 mL | Freq: Once | INTRAVENOUS | Status: DC

## 2015-06-20 MED ORDER — LORAZEPAM 2 MG/ML IJ SOLN
1.0000 mg | Freq: Once | INTRAMUSCULAR | Status: AC
  Administered 2015-06-20: 1 mg via INTRAVENOUS
  Filled 2015-06-20: qty 1

## 2015-06-20 MED ORDER — SODIUM CHLORIDE 0.9 % IV SOLN
8.0000 mg/h | INTRAVENOUS | Status: DC
  Administered 2015-06-20 – 2015-06-21 (×2): 8 mg/h via INTRAVENOUS
  Filled 2015-06-20 (×7): qty 80

## 2015-06-20 MED ORDER — FOLIC ACID 1 MG PO TABS
1.0000 mg | ORAL_TABLET | Freq: Every day | ORAL | Status: DC
  Administered 2015-06-21 – 2015-06-24 (×4): 1 mg via ORAL
  Filled 2015-06-20 (×4): qty 1

## 2015-06-20 MED ORDER — DEXTROSE 5 % IV SOLN
500.0000 mg | INTRAVENOUS | Status: DC
  Administered 2015-06-20 – 2015-06-22 (×3): 500 mg via INTRAVENOUS
  Filled 2015-06-20 (×2): qty 500

## 2015-06-20 MED ORDER — LEVALBUTEROL HCL 1.25 MG/0.5ML IN NEBU: 1.2500 mg | INHALATION_SOLUTION | Freq: Four times a day (QID) | RESPIRATORY_TRACT | Status: DC | PRN

## 2015-06-20 MED ORDER — PANTOPRAZOLE SODIUM 40 MG IV SOLR
INTRAVENOUS | Status: AC
  Filled 2015-06-20: qty 80

## 2015-06-20 MED ORDER — MAGNESIUM SULFATE 2 GM/50ML IV SOLN
2.0000 g | Freq: Once | INTRAVENOUS | Status: AC
Start: 2015-06-20 — End: 2015-06-20
  Administered 2015-06-20: 2 g via INTRAVENOUS
  Filled 2015-06-20: qty 50

## 2015-06-20 MED ORDER — ADENOSINE 6 MG/2ML IV SOLN
INTRAVENOUS | Status: AC
  Administered 2015-06-20: 6 mg
  Filled 2015-06-20: qty 2

## 2015-06-20 MED ORDER — CEFTRIAXONE SODIUM 1 G IJ SOLR
INTRAMUSCULAR | Status: AC
  Administered 2015-06-20: 1000 mg
  Filled 2015-06-20: qty 10

## 2015-06-20 MED ORDER — DEXTROSE 5 % IV SOLN
1.0000 g | INTRAVENOUS | Status: DC
  Administered 2015-06-20 – 2015-06-23 (×4): 1 g via INTRAVENOUS
  Filled 2015-06-20 (×5): qty 10

## 2015-06-20 MED ORDER — INFLUENZA VAC SPLIT QUAD 0.5 ML IM SUSY
0.5000 mL | PREFILLED_SYRINGE | INTRAMUSCULAR | Status: DC
  Filled 2015-06-20: qty 0.5

## 2015-06-20 MED ORDER — FOLIC ACID 5 MG/ML IJ SOLN
1.0000 mg | Freq: Once | INTRAMUSCULAR | Status: AC
  Administered 2015-06-20: 1 mg via INTRAVENOUS
  Filled 2015-06-20: qty 0.2

## 2015-06-20 MED ORDER — LORAZEPAM 2 MG/ML IJ SOLN: 1.0000 mg | INTRAMUSCULAR | Status: DC | PRN

## 2015-06-20 MED ORDER — MAGNESIUM SULFATE 2 GM/50ML IV SOLN
2.0000 g | Freq: Once | INTRAVENOUS | Status: DC
Start: 2015-06-20 — End: 2015-06-20

## 2015-06-20 MED ORDER — ONDANSETRON HCL 4 MG PO TABS: 4.0000 mg | ORAL_TABLET | Freq: Four times a day (QID) | ORAL | Status: DC | PRN

## 2015-06-20 MED ORDER — SULFAMETHOXAZOLE-TRIMETHOPRIM 400-80 MG/5ML IV SOLN
320.0000 mg | Freq: Three times a day (TID) | INTRAVENOUS | Status: DC
  Administered 2015-06-20 – 2015-06-22 (×4): 320 mg via INTRAVENOUS
  Filled 2015-06-20 (×13): qty 20

## 2015-06-20 MED ORDER — SODIUM CHLORIDE 0.9 % IV SOLN
80.0000 mg | Freq: Once | INTRAVENOUS | Status: AC
  Administered 2015-06-20: 80 mg via INTRAVENOUS

## 2015-06-20 MED ORDER — SODIUM CHLORIDE 0.9 % IV SOLN
10.0000 mL/h | Freq: Once | INTRAVENOUS | Status: AC
  Administered 2015-06-20: 10 mL/h via INTRAVENOUS

## 2015-06-20 MED ORDER — POTASSIUM CHLORIDE IN NACL 20-0.9 MEQ/L-% IV SOLN
INTRAVENOUS | Status: DC
  Administered 2015-06-20: 21:00:00 via INTRAVENOUS
  Filled 2015-06-20 (×3): qty 1000

## 2015-06-20 MED ORDER — SODIUM CHLORIDE 0.9 % IV BOLUS (SEPSIS): 500.0000 mL | Freq: Once | INTRAVENOUS | Status: DC

## 2015-06-20 MED ORDER — NICOTINE 21 MG/24HR TD PT24
21.0000 mg | MEDICATED_PATCH | Freq: Every day | TRANSDERMAL | Status: DC
  Administered 2015-06-21 – 2015-06-23 (×3): 21 mg via TRANSDERMAL
  Filled 2015-06-20 (×4): qty 1

## 2015-06-20 MED ORDER — IPRATROPIUM BROMIDE 0.02 % IN SOLN: 0.5000 mg | Freq: Four times a day (QID) | RESPIRATORY_TRACT | Status: DC | PRN

## 2015-06-20 MED ORDER — SODIUM CHLORIDE 0.9 % IV SOLN
Freq: Once | INTRAVENOUS | Status: AC
  Administered 2015-06-21: 02:00:00 via INTRAVENOUS

## 2015-06-20 MED ORDER — PNEUMOCOCCAL VAC POLYVALENT 25 MCG/0.5ML IJ INJ
0.5000 mL | INJECTION | INTRAMUSCULAR | Status: DC
  Filled 2015-06-20: qty 0.5

## 2015-06-20 MED ORDER — AZITHROMYCIN 500 MG IV SOLR
INTRAVENOUS | Status: AC
  Filled 2015-06-20: qty 500

## 2015-06-20 MED ORDER — ENSURE ENLIVE PO LIQD: 237.0000 mL | Freq: Two times a day (BID) | ORAL | Status: DC

## 2015-06-20 MED ORDER — NICOTINE 21 MG/24HR TD PT24
21.0000 mg | MEDICATED_PATCH | Freq: Every day | TRANSDERMAL | Status: AC
  Administered 2015-06-20: 21 mg via TRANSDERMAL
  Filled 2015-06-20: qty 1

## 2015-06-20 MED ORDER — ONDANSETRON HCL 4 MG/2ML IJ SOLN
4.0000 mg | Freq: Four times a day (QID) | INTRAMUSCULAR | Status: DC | PRN
  Administered 2015-06-23: 4 mg via INTRAVENOUS
  Filled 2015-06-20: qty 2

## 2015-06-20 NOTE — Progress Notes (Signed)
Patient has 1 iv access with active orders for both iv protonix and packed red blood cells.  Placement of patient's PICC line won't likely be completed until tomorrow.  Protonix will need to be paused during transfusion of PRBC.  The on-call has been made aware.

## 2015-06-20 NOTE — Progress Notes (Signed)
Called by Dr Wilson Singer from [facility] for:  43 year old male-history of HIV/AIDS-not on retrovirals- does not follow up with infectious disease-last CD4 count in January 2016 of 10-presented with generalized weakness,fatigue and shortness of breath. Initially found to have SVT with hypotension-SVT resolved with adenosine, blood pressure improved to the 0000000 systolic range-patient mentating well and basically asymptomatic now. Further blood work demonstrated a hemoglobin of 5.8 (last hemoglobin around 7-8 in October 2016).Per ED MD-green stools on rectal exam. Chest x-ray showed interstitial changes-with some suspicion for PCP.  Being transferred for the following 2 issues: 1. Work up of anemia-some suspicion for slow GI bleed-started on PPI, 2 units PRBC ordered-please consult GI when patient gets here 2. Suspected PCP-please consult ID.  Accepted to SDU.

## 2015-06-20 NOTE — Progress Notes (Signed)
ANTIBIOTIC CONSULT NOTE - INITIAL  Pharmacy Consult for Septra Indication: PCP  No Known Allergies  Vital Signs: Temp: 97.7 F (36.5 C) (11/18 1043) Temp Source: Oral (11/18 1043) BP: 65/47 mmHg (11/18 1103) Pulse Rate: 183 (11/18 1112) Intake/Output from previous day:   Intake/Output from this shift:    Labs:  Recent Labs  06/20/15 1200  WBC 3.5*  HGB 5.8*  PLT 191  CREATININE 0.97   CrCl cannot be calculated (Unknown ideal weight.). No results for input(s): VANCOTROUGH, VANCOPEAK, VANCORANDOM, GENTTROUGH, GENTPEAK, GENTRANDOM, TOBRATROUGH, TOBRAPEAK, TOBRARND, AMIKACINPEAK, AMIKACINTROU, AMIKACIN in the last 72 hours.   Microbiology: No results found for this or any previous visit (from the past 720 hour(s)).  Medical History: Past Medical History  Diagnosis Date  . HIV (human immunodeficiency virus infection) [facility])    Assessment: 44 yo m with HIV not currently on treatment presenting to the ED on 11/18 with generalized weakness and fatigue.  CXR with interstitial opacities. Last CD4 count was 10 in January of 2016. Pharmacy is consulted to dose Septra for PCP. Wbc 3.5, afebrile, SCr 0.97, CrCl ~92 ml/min.  Septra for PCP 11/18 >>  11/18 Sputum Cx: sent  Goal of Therapy:  Eradication of infection  Proper dosing of antibiotics  Plan:  Septra 320 mg IV q8h F/u CD4 count, CXR, HIV viral load, renal fx, clinical course  Addylin Manke L. Nicole Kindred, PharmD PGY2 Infectious Diseases Pharmacy Resident Pager: [phone] 06/20/2015 2:07 PM

## 2015-06-20 NOTE — Progress Notes (Signed)
Utilization review completed.  L. J. Markea Ruzich RN, BSN, CM 

## 2015-06-20 NOTE — ED Provider Notes (Signed)
CSN: [id number]     Arrival date & time 06/20/15  1039 History   First MD Initiated Contact with Patient 06/20/15 1127     Chief Complaint  Patient presents with  . Shortness of Breath     (Consider location/radiation/quality/duration/timing/severity/associated sxs/prior Treatment) HPI   45 year old male with generalized fatigue and dyspnea. Onset about a month ago and progressively worsening. Patient reports recent evaluation and told that he was anemic. Says currently being worked up by PCP and schedule for endoscopy next week. He reports persistent epigastric pain which is worse shortly after eating or drinking anything. Describes it as feeling like there are "rocks" in his stomach. Stool has been dark recently but not really melanotic. "Dark green." Not tarry. Denies any chest pain. Feels very short of breath with minimal activity. Feels better at rest. In SVT on arrival. He reports intermittent palpitations last few weeks but no prior diagnosis of this. No known CAD, but does not have routine medical care. He is HIV-positive but is not followed by infectious disease currently. Anorexia. Unintentional weight loss. Worsening LE edema over the last several weeks.   Past Medical History  Diagnosis Date  . HIV (human immunodeficiency virus infection) [facility])    Past Surgical History  Procedure Laterality Date  . Lung surgery     No family history on file. Social History  Substance Use Topics  . Smoking status: Current Every Day Smoker -- 1.00 packs/day    Types: Cigarettes  . Smokeless tobacco: None  . Alcohol Use: Yes    Review of Systems  All systems reviewed and negative, other than as noted in HPI.   Allergies  Review of patient's allergies indicates no known allergies.  Home Medications   Prior to Admission medications   Medication Sig Start Date End Date Taking? Authorizing Provider  ferrous sulfate 325 (65 FE) MG tablet Take 1 tablet (325 mg total) by mouth daily.  05/29/15   Marella Chimes, PA-C  omeprazole (PRILOSEC) 20 MG capsule Take 1 capsule (20 mg total) by mouth daily. 05/27/15   Carlisle Cater, PA-C  polyethylene glycol powder (GLYCOLAX/MIRALAX) powder Take 17 g by mouth daily. 05/27/15   Carlisle Cater, PA-C   BP 65/47 mmHg  Pulse 183  Temp(Src) 97.7 F (36.5 C) (Oral)  Resp 22  SpO2 100% Physical Exam  Constitutional:  Sitting up in bed. Chronically ill appearing, but in NAD.   HENT:  Head: Normocephalic and atraumatic.  Eyes: Conjunctivae are normal. Right eye exhibits no discharge. Left eye exhibits no discharge.  Neck: Neck supple.  Cardiovascular: Normal rate, regular rhythm and normal heart sounds.  Exam reveals no gallop and no friction rub.   No murmur heard. Marked tachycardia  Pulmonary/Chest: Effort normal and breath sounds normal. No respiratory distress.  Abdominal: Soft. He exhibits no distension. There is tenderness.  mild epigastric tenderness w/o rebound or guarding  Musculoskeletal: He exhibits edema. He exhibits no tenderness.  Symmetric LE edema.   Neurological: He is alert.  Skin: Skin is warm and dry.  Psychiatric: He has a normal mood and affect. His behavior is normal. Thought content normal.  Nursing note and vitals reviewed.   ED Course  .Cardioversion Date/Time: 06/20/2015 11:15 AM Performed by: Virgel Manifold Authorized by: Virgel Manifold Consent: Verbal consent obtained. Risks and benefits: risks, benefits and alternatives were discussed Required items: required blood products, implants, devices, and special equipment available Patient identity confirmed: verbally with patient and provided demographic data Patient sedated: no Cardioversion basis:  emergent Pre-procedure rhythm: supraventricular tachycardia Post-procedure rhythm: normal sinus rhythm Complications: no complications Patient tolerance: Patient tolerated the procedure well with no immediate complications Comments: Chemical  cardioversion with adenosine 6mg  x1.   CRITICAL CARE Performed by: Virgel Manifold Total critical care time: 35 minutes Critical care time was exclusive of separately billable procedures and treating other patients. Critical care was necessary to treat or prevent imminent or life-threatening deterioration. Critical care was time spent personally by me on the following activities: development of treatment plan with patient and/or surrogate as well as nursing, discussions with consultants, evaluation of patient's response to treatment, examination of patient, obtaining history from patient or surrogate, ordering and performing treatments and interventions, ordering and review of laboratory studies, ordering and review of radiographic studies, pulse oximetry and re-evaluation of patient's condition.   (Including critical care time) Labs Review Labs Reviewed  COMPREHENSIVE METABOLIC PANEL - Abnormal; Notable for the following:    Glucose, Bld 110 (*)    BUN 22 (*)    Calcium 7.4 (*)    Total Protein 4.9 (*)    Albumin 1.4 (*)    All other components within normal limits  TROPONIN I - Abnormal; Notable for the following:    Troponin I 0.09 (*)    All other components within normal limits  MAGNESIUM - Abnormal; Notable for the following:    Magnesium 1.6 (*)    All other components within normal limits  CBC WITH DIFFERENTIAL/PLATELET - Abnormal; Notable for the following:    WBC 3.5 (*)    RBC 2.53 (*)    Hemoglobin 5.8 (*)    HCT 18.5 (*)    MCV 73.1 (*)    MCH 22.9 (*)    RDW 21.1 (*)    Lymphs Abs 0.1 (*)    All other components within normal limits  RETICULOCYTES - Abnormal; Notable for the following:    RBC. 2.58 (*)    All other components within normal limits  I-STAT CG4 LACTIC ACID, ED - Abnormal; Notable for the following:    Lactic Acid, Venous 3.05 (*)    All other components within normal limits  CULTURE, EXPECTORATED SPUTUM-ASSESSMENT  URINALYSIS, ROUTINE W REFLEX  MICROSCOPIC (NOT AT [facility])  TSH  T-HELPER CELLS (CD4) COUNT (NOT AT [facility])  VITAMIN B12  FOLATE  IRON AND TIBC  FERRITIN  OCCULT BLOOD X 1 CARD TO LAB, STOOL  LACTATE DEHYDROGENASE  PREPARE RBC (CROSSMATCH)  TYPE AND SCREEN    Imaging Review Dg Chest 2 View  06/20/2015  CLINICAL DATA:  Dizziness as well as shortness of breath for 3 weeks worsening over the last few days. History of HIV infection. EXAM: CHEST  2 VIEW COMPARISON:  08/28/2014 FINDINGS: There is peribronchial interstitial type densities which extend into the lower lobes and right middle lobe, increased from the prior exam. There are no focal areas of lung consolidation to suggest lobar pneumonia. Lungs are otherwise clear. No pleural effusion or pneumothorax. Normal heart, mediastinum hila. Skeletal structures are unremarkable. IMPRESSION: 1. Interstitial type opacities noted along the bronchovascular bundles to the lower lobes and right middle lobe. This is consistent with bronchial inflammation or an interstitial type pneumonia. There is no evidence of lobar pneumonia or pulmonary edema. Electronically Signed   By: Lajean Manes M.D.   On: 06/20/2015 12:05   I have personally reviewed and evaluated these images and lab results as part of my medical decision-making.   EKG Interpretation   Date/Time:  Friday June 20 2015  11:26:03 EST Ventricular Rate:  105 PR Interval:  144 QRS Duration: 88 QT Interval:  362 QTC Calculation: 478 R Axis:   67 Text Interpretation:  Rhythm now sinus tachycardia Nonspecific T wave  abnormality Confirmed by Wilson Singer  MD, Therese Rocco ([id number]) on 06/20/2015 11:31:54  AM      MDM   Final diagnoses:  SVT (supraventricular tachycardia) (HCC)  Anemia, unspecified anemia type  Hypotension, unspecified hypotension type  Elevated troponin  HIV disease ([place])    44 year old male with generalized weakness/fatigue. Likely multifactorial. Arrived to the emergency room in SVT. Converted to sinus with  adenosine. Troponin is mildly elevated. Denies CP. Suspect demand ischemia secondary to markedly elevated heart rate. Will trend. Aspirin deferred with worsening anemia. Suspect this is secondary to an upper GI bleed based on symptoms of gnawing epigastric pain which is worse after eating.  Started on Protonix. Will transfuse. May be some element of anemia of chronic disease. Patient has a history of HIV and is not currently being treated for it.  Peripheral edema. May be some component of high output failure with severe anemia and as well as poor nutrition/hypoalbunemia. CXR w/o overt edema but does have interstitial opacities. Consider PCP.  Immunocompromised. Last CD4 count in January was 10. Is complaining of dyspnea but several other plausible explanations for this as well as his weight loss, fatigue, etc. Afebrile. o2 sats normal.  Will place on abx including bactrim until sorted out. Hypotensive. Improved after conversion to sinus rhythm, but remains low. Is not distressed. Mentation fine. IVF for now and reassess. Multiple issues which need to be addressed and requires admission.     Virgel Manifold, MD 06/20/15 [phone]

## 2015-06-20 NOTE — ED Notes (Signed)
Sob x 1 month  Worse today

## 2015-06-20 NOTE — ED Notes (Signed)
Pt refuses second iv line. Explained to pt that we need a second iv line in case he needs more than one medication or fluid boluses, pt cont to refuse. Pt states "hell no. Only one needle at a time."

## 2015-06-20 NOTE — ED Notes (Signed)
Lab results given to Dr. Wilson Singer, hgb 5.8.

## 2015-06-21 ENCOUNTER — Inpatient Hospital Stay (HOSPITAL_COMMUNITY): Payer: BLUE CROSS/BLUE SHIELD

## 2015-06-21 DIAGNOSIS — R1013 Epigastric pain: Secondary | ICD-10-CM

## 2015-06-21 DIAGNOSIS — B59 Pneumocystosis: Secondary | ICD-10-CM | POA: Diagnosis present

## 2015-06-21 DIAGNOSIS — R197 Diarrhea, unspecified: Secondary | ICD-10-CM | POA: Diagnosis present

## 2015-06-21 DIAGNOSIS — E872 Acidosis, unspecified: Secondary | ICD-10-CM | POA: Diagnosis present

## 2015-06-21 DIAGNOSIS — I959 Hypotension, unspecified: Secondary | ICD-10-CM | POA: Diagnosis present

## 2015-06-21 DIAGNOSIS — J189 Pneumonia, unspecified organism: Secondary | ICD-10-CM | POA: Diagnosis present

## 2015-06-21 DIAGNOSIS — D619 Aplastic anemia, unspecified: Secondary | ICD-10-CM | POA: Diagnosis present

## 2015-06-21 DIAGNOSIS — A09 Infectious gastroenteritis and colitis, unspecified: Secondary | ICD-10-CM

## 2015-06-21 DIAGNOSIS — I471 Supraventricular tachycardia: Secondary | ICD-10-CM

## 2015-06-21 DIAGNOSIS — G8929 Other chronic pain: Secondary | ICD-10-CM

## 2015-06-21 DIAGNOSIS — D649 Anemia, unspecified: Secondary | ICD-10-CM

## 2015-06-21 LAB — C DIFFICILE QUICK SCREEN W PCR REFLEX
C DIFFICILE (CDIFF) TOXIN: NEGATIVE
C DIFFICLE (CDIFF) ANTIGEN: NEGATIVE
C Diff interpretation: NEGATIVE

## 2015-06-21 LAB — COMPREHENSIVE METABOLIC PANEL
ALK PHOS: 100 U/L (ref 38–126)
ALT: 17 U/L (ref 17–63)
AST: 34 U/L (ref 15–41)
Albumin: 1.2 g/dL — ABNORMAL LOW (ref 3.5–5.0)
Anion gap: 3 — ABNORMAL LOW (ref 5–15)
BUN: 13 mg/dL (ref 6–20)
CALCIUM: 7.1 mg/dL — AB (ref 8.9–10.3)
CHLORIDE: 107 mmol/L (ref 101–111)
CO2: 25 mmol/L (ref 22–32)
CREATININE: 0.71 mg/dL (ref 0.61–1.24)
Glucose, Bld: 76 mg/dL (ref 65–99)
Potassium: 3.5 mmol/L (ref 3.5–5.1)
Sodium: 135 mmol/L (ref 135–145)
Total Bilirubin: 0.4 mg/dL (ref 0.3–1.2)
Total Protein: 4.4 g/dL — ABNORMAL LOW (ref 6.5–8.1)

## 2015-06-21 LAB — URINALYSIS, ROUTINE W REFLEX MICROSCOPIC
Bilirubin Urine: NEGATIVE
Glucose, UA: NEGATIVE mg/dL
Hgb urine dipstick: NEGATIVE
Ketones, ur: NEGATIVE mg/dL
Leukocytes, UA: NEGATIVE
Nitrite: NEGATIVE
Protein, ur: 30 mg/dL — AB
Specific Gravity, Urine: 1.028 (ref 1.005–1.030)
pH: 6 (ref 5.0–8.0)

## 2015-06-21 LAB — CBC WITH DIFFERENTIAL/PLATELET
BASOS ABS: 0 10*3/uL (ref 0.0–0.1)
Basophils Relative: 0 %
EOS PCT: 1 %
Eosinophils Absolute: 0 10*3/uL (ref 0.0–0.7)
HEMATOCRIT: 20.2 % — AB (ref 39.0–52.0)
HEMOGLOBIN: 6.2 g/dL — AB (ref 13.0–17.0)
LYMPHS ABS: 0.3 10*3/uL — AB (ref 0.7–4.0)
LYMPHS PCT: 8 %
MCH: 22.6 pg — AB (ref 26.0–34.0)
MCHC: 30.7 g/dL (ref 30.0–36.0)
MCV: 73.7 fL — AB (ref 78.0–100.0)
MONOS PCT: 8 %
Monocytes Absolute: 0.3 10*3/uL (ref 0.1–1.0)
NEUTROS PCT: 83 %
Neutro Abs: 3.2 10*3/uL (ref 1.7–7.7)
PLATELETS: 158 10*3/uL (ref 150–400)
RBC: 2.74 MIL/uL — AB (ref 4.22–5.81)
RDW: 20.9 % — AB (ref 11.5–15.5)
WBC: 3.8 10*3/uL — AB (ref 4.0–10.5)

## 2015-06-21 LAB — MAGNESIUM: MAGNESIUM: 2 mg/dL (ref 1.7–2.4)

## 2015-06-21 LAB — STREP PNEUMONIAE URINARY ANTIGEN: STREP PNEUMO URINARY ANTIGEN: NEGATIVE

## 2015-06-21 LAB — URINE MICROSCOPIC-ADD ON: RBC / HPF: NONE SEEN RBC/hpf (ref 0–5)

## 2015-06-21 LAB — TROPONIN I
Troponin I: 0.03 ng/mL (ref <0.031)
Troponin I: <0.03 ng/mL (ref <0.031)

## 2015-06-21 LAB — PREPARE RBC (CROSSMATCH)

## 2015-06-21 LAB — OCCULT BLOOD X 1 CARD TO LAB, STOOL: Fecal Occult Bld: POSITIVE — AB

## 2015-06-21 MED ORDER — HYDROMORPHONE HCL 1 MG/ML IJ SOLN
1.0000 mg | Freq: Once | INTRAMUSCULAR | Status: AC
  Administered 2015-06-21: 1 mg via INTRAVENOUS

## 2015-06-21 MED ORDER — PREDNISONE 20 MG PO TABS
40.0000 mg | ORAL_TABLET | Freq: Two times a day (BID) | ORAL | Status: DC
  Administered 2015-06-21 – 2015-06-24 (×6): 40 mg via ORAL
  Filled 2015-06-21 (×7): qty 2

## 2015-06-21 MED ORDER — ENSURE ENLIVE PO LIQD: 237.0000 mL | Freq: Three times a day (TID) | ORAL | Status: DC

## 2015-06-21 MED ORDER — IOHEXOL 350 MG/ML SOLN
100.0000 mL | Freq: Once | INTRAVENOUS | Status: AC | PRN
  Administered 2015-06-21: 100 mL via INTRAVENOUS

## 2015-06-21 MED ORDER — EMTRICITABINE-TENOFOVIR AF 200-25 MG PO TABS
1.0000 | ORAL_TABLET | Freq: Every day | ORAL | Status: DC
  Administered 2015-06-21 – 2015-06-24 (×4): 1 via ORAL
  Filled 2015-06-21 (×4): qty 1

## 2015-06-21 MED ORDER — POLYETHYLENE GLYCOL 3350 17 G PO PACK
17.0000 g | PACK | Freq: Every day | ORAL | Status: DC
  Administered 2015-06-21: 17 g via ORAL
  Filled 2015-06-21: qty 1

## 2015-06-21 MED ORDER — SODIUM CHLORIDE 0.9 % IV SOLN
Freq: Once | INTRAVENOUS | Status: AC
  Administered 2015-06-21: 13:00:00 via INTRAVENOUS

## 2015-06-21 MED ORDER — DOLUTEGRAVIR SODIUM 50 MG PO TABS
50.0000 mg | ORAL_TABLET | Freq: Every day | ORAL | Status: DC
  Administered 2015-06-21 – 2015-06-24 (×4): 50 mg via ORAL
  Filled 2015-06-21 (×5): qty 1

## 2015-06-21 NOTE — H&P (Signed)
[facility] History and Physical  Miguel Campos [id number] DOB: Sep 21, 1970 DOA: 06/20/2015  Referring physician: Virgel Manifold, MD PCP: Miguel Cowman, PA-C   Chief Complaint: Shortness of breath.  HPI: Miguel Campos is a 44 y.o. male with a past medical history of HIV not on antiretroviral therapy, tobacco abuse disorder who presented to [facility] with a history of progressively worse dyspnea and fatigue for about a month. He said that he was evaluated by his primary care provider recently who found him to be anemic and referred him to see a gastroenterologist, who was schedule him to have an EGD due to intense postprandial epigastric pain associated with nausea, but no emesis. He denies melena, or hematochezia, but notices that his stools, which are usually loose, have been dark green in color recently. He has had a significant weight loss in the past 2 months.  In the ER, he was found to be on SVT, which converted with IV adenosine. The patient states that he has been having frequent palpitations, pitting edema of the lower extremities, mild dizziness, but denies chest pain, PND or orthopnea.   His workup revealed, anemia, a chest x-ray to an LDH level consistent with atypical pneumonia. He denies fever, night sweats, hemoptysis, but complains of chills and fatigue.  When seen in the stepdown unit, the patient was mildly anxious and stated that he had epigastric pain about a 7 out of 10 at that time. He was in no acute distress.  Review of Systems:  Constitutional:  Positive chills, weight loss, fatigue. No  night sweats, Fevers.   HEENT:  No headaches, Difficulty swallowing,Tooth/dental problems,Sore throat,  No sneezing, itching, ear ache, nasal congestion, post nasal drip,  Cardio-vascular: Positive palpitations, mild dizziness  No chest pain, Orthopnea, PND, anasarca.  GI:  Positive postprandial abdominal pain, nausea, chronic diarrhea, loss of appetite. He was treated for  oral thrush recently. Denies melena, but states that the stools have been dark green recently, no hematochezia or constipation. Resp:  Positive dyspnea, positive cough with occasional production of yellowish sputum. No hemoptysis. Skin:  no rash or lesions.  GU:  no dysuria, change in color of urine, no urgency or frequency. No flank pain.  Musculoskeletal:  No joint pain or swelling. No decreased range of motion. No back pain.  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  No depression . No memory loss.   Past Medical History  Diagnosis Date  . HIV (human immunodeficiency virus infection) [facility])    Past Surgical History  Procedure Laterality Date  . Lung surgery     Social History:  reports that he has been smoking Cigarettes.  He has been smoking about 1.00 pack per day. He does not have any smokeless tobacco history on file. He reports that he drinks alcohol. He reports that he uses illicit drugs (Marijuana).  No Known Allergies  History reviewed. No pertinent family history.   Prior to Admission medications   Medication Sig Start Date End Date Taking? Authorizing Provider  ferrous sulfate 325 (65 FE) MG tablet Take 1 tablet (325 mg total) by mouth daily. 05/29/15  Yes Miguel Chimes, PA-C  omeprazole (PRILOSEC) 20 MG capsule Take 1 capsule (20 mg total) by mouth daily. 05/27/15  Yes Miguel Cater, PA-C  polyethylene glycol powder (GLYCOLAX/MIRALAX) powder Take 17 g by mouth daily. 05/27/15  Yes Miguel Cater, PA-C   Physical Exam: Filed Vitals:   06/20/15 1630 06/20/15 1757 06/20/15 2001 06/20/15  2323  BP: 101/63  93/63 113/62  Pulse:  101 110   Temp:   98.8 F (37.1 C) 97.7 F (36.5 C)  TempSrc:   Axillary Oral  Resp: 23 21 20 18   Height:  5\' 7"  (1.702 m)    Weight:  60.7 kg (133 lb 13.1 oz)    SpO2:  100%  100%    Wt Readings from Last 3 Encounters:  06/20/15 60.7 kg (133 lb 13.1 oz)  05/27/15 67.132 kg (148 lb)   05/13/15 65.772 kg (145 lb)    General:  Cachectic. Appears mildly anxious. Eyes: PERRL, normal lids, irises & conjunctiva ENT: grossly normal hearing, lips and oral mucosa are moist Neck: no LAD, masses or thyromegaly Cardiovascular: Tachycardic. 1+ LE edema. Telemetry: Tachycardic at 106 bpm Respiratory: Mild wheezing and bilateral rales. Abdomen: soft, positive epigastric tenderness, no guarding or rebound tenderness. Skin: no rash or induration seen on limited exam Musculoskeletal: grossly normal tone BUE/BLE Psychiatric: grossly normal mood and affect, speech fluent and appropriate Neurologic: grossly non-focal.          Labs on Admission:  Basic Metabolic Panel:  Recent Labs Lab 06/20/15 1200 06/20/15 2002  NA 136  --   K 3.7  --   CL 105  --   CO2 23  --   GLUCOSE 110*  --   BUN 22*  --   CREATININE 0.97  --   CALCIUM 7.4*  --   MG 1.6*  --   PHOS  --  3.3   Liver Function Tests:  Recent Labs Lab 06/20/15 1200  AST 39  ALT 17  ALKPHOS 119  BILITOT 0.3  PROT 4.9*  ALBUMIN 1.4*   CBC:  Recent Labs Lab 06/20/15 1200  WBC 3.5*  NEUTROABS 3.2  HGB 5.8*  HCT 18.5*  MCV 73.1*  PLT 191   Cardiac Enzymes:  Recent Labs Lab 06/20/15 1150 06/20/15 2002  TROPONINI 0.09* 0.04*    BNP (last 3 results)  Recent Labs  06/20/15 2002  BNP 243.8*     Radiological Exams on Admission: Dg Chest 2 View  06/20/2015  CLINICAL DATA:  Dizziness as well as shortness of breath for 3 weeks worsening over the last few days. History of HIV infection. EXAM: CHEST  2 VIEW COMPARISON:  08/28/2014 FINDINGS: There is peribronchial interstitial type densities which extend into the lower lobes and right middle lobe, increased from the prior exam. There are no focal areas of lung consolidation to suggest lobar pneumonia. Lungs are otherwise clear. No pleural effusion or pneumothorax. Normal heart, mediastinum hila. Skeletal structures are unremarkable. IMPRESSION: 1.  Interstitial type opacities noted along the bronchovascular bundles to the lower lobes and right middle lobe. This is consistent with bronchial inflammation or an interstitial type pneumonia. There is no evidence of lobar pneumonia or pulmonary edema. Electronically Signed   By: Lajean Manes M.D.   On: 06/20/2015 12:05    EKG: Independently reviewed. Vent. rate 181 BPM PR interval * ms QRS duration 84 ms QT/QTc 260/451 ms P-R-T axes * 75 29 Supraventricular tachycardia with Premature ventricular complexes or Fusion complexes Nonspecific ST abnormality Abnormal ECG Confirmed by KOHUT  Assessment/Plan Principal Problem:   SVT (supraventricular tachycardia) (HCC) Admit to a stepdown. Optimize electrolytes. Transfuse packed RBCs. Check echocardiogram.  Active Problems:   Anemia Continue transfusion of packed RBCs Supplement folic acid. Likely due to peptic ulcer disease since the patient complains of epigastric pain after eating. Gastroenterology will be evaluating the patient  in the morning.    Atypical pneumonia Continue current IV antibiotic coverage. The patient is not currently hypoxic and his O2 sat is 99-100% on 2 L nasal cannula. Dr. Tommy Medal suggested to start steroids if the patient becomes hypoxic during the night. Follow-up sputum Gram stain, culture and sensitivity. Follow-up blood cultures.    AIDS [facility]) Per patient, he would like to start antiretroviral medications. Check CD4 count, CVA count and viral load.    Tobacco abuse disorder Continue nicotine replacement therapy.    Hypomagnesemia Optimize magnesium and follow-up level    Lactic acidosis Due to anemia and atypical pneumonia. Continue supplemental oxygen, IV hydration, blood transfusion, IV antibiotics. Follow-up lactic acid level.  Gastroenterology was consulted (Mauri Pole, MD)  Infectious diseases was consulted [facility] Tommy Medal, MD)  Code Status: Full code. DVT Prophylaxis: Mechanical  with SCDs. Family Communication: His mother was by bedside. Disposition Plan: Admit to a stepdown, transfuse, continue IV antibiotics, GI and ID consults.  Time spent: Over 120 minutes were spent during the process of this admission.  Reubin Milan [facility] Pager [phone].

## 2015-06-21 NOTE — Consult Note (Addendum)
Consultation  Referring Provider:     [facility], [facility] Care Physician:  Isaias Cowman, PA-C Primary Gastroenterologist:       None  Reason for Consultation:     Iron deficiency Anemia            HPI:   Miguel Campos is a 44 y.o. male h/o  HIV not on antiretroviral therapy presented with a history of progressively worsening of dyspnea and fatigue for about a month . He was found to be in SVT in the ER converted with IV adenosine.  Per patient he was noted to be anemic by his PMD and was referred to a gastroenterologist and [place] for EGD and colonoscopy. He complained of having epigastric abdominal pain with water or any food intake and has since improved after he was started on antibiotics during this admission. Denies any chest pain, nausea, vomiting, heartburn, dysphagia or odynophagia. No history of diarrhea, black stool or blood in stool. He was constipated a few days ago and was started on laxatives and has been having loose stools since then.  He has had a significant weight loss in the past 2 months. Unclear why the patient was not on antiretroviral therapy.     Past Medical History  Diagnosis Date  . HIV (human immunodeficiency virus infection) [facility])     Past Surgical History  Procedure Laterality Date  . Lung surgery      History reviewed. No pertinent family history.   Social History  Substance Use Topics  . Smoking status: Current Every Day Smoker -- 1.00 packs/day    Types: Cigarettes  . Smokeless tobacco: None  . Alcohol Use: Yes    Prior to Admission medications   Medication Sig Start Date End Date Taking? Authorizing Provider  ferrous sulfate 325 (65 FE) MG tablet Take 1 tablet (325 mg total) by mouth daily. 05/29/15  Yes Marella Chimes, PA-C  omeprazole (PRILOSEC) 20 MG capsule Take 1 capsule (20 mg total) by mouth daily. 05/27/15  Yes Carlisle Cater, PA-C  polyethylene glycol powder (GLYCOLAX/MIRALAX) powder Take 17 g by mouth daily.  05/27/15  Yes Carlisle Cater, PA-C    Current Facility-Administered Medications  Medication Dose Route Frequency Provider Last Rate Last Dose  . 0.9 %  sodium chloride infusion   Intravenous Once Reyne Dumas, MD      . 0.9 % NaCl with KCl 20 mEq/ L  infusion   Intravenous Continuous Reyne Dumas, MD   Stopped at 06/20/15 2356  . azithromycin (ZITHROMAX) 500 mg in dextrose 5 % 250 mL IVPB  500 mg Intravenous Q24H Virgel Manifold, MD 250 mL/hr at 06/20/15 1606 500 mg at 06/20/15 1606  . cefTRIAXone (ROCEPHIN) 1 g in dextrose 5 % 50 mL IVPB  1 g Intravenous Q24H Virgel Manifold, MD   Stopped at 06/20/15 1606  . dolutegravir (TIVICAY) tablet 50 mg  50 mg Oral Daily Truman Hayward, MD      . emtricitabine-tenofovir AF (DESCOVY) 200-25 MG per tablet 1 tablet  1 tablet Oral Daily Truman Hayward, MD      . feeding supplement (ENSURE ENLIVE) (ENSURE ENLIVE) liquid 237 mL  237 mL Oral BID BM Reubin Milan, MD      . folic acid (FOLVITE) tablet 1 mg  1 mg Oral Daily Reubin Milan, MD      . HYDROmorphone (DILAUDID) injection 1 mg  1 mg Intravenous Q4H PRN Reubin Milan, MD  1 mg at 06/21/15 0856  . Influenza vac split quadrivalent PF (FLUARIX) injection 0.5 mL  0.5 mL Intramuscular Tomorrow-1000 Reubin Milan, MD      . ipratropium (ATROVENT) nebulizer solution 0.5 mg  0.5 mg Nebulization Q6H PRN Reubin Milan, MD      . levalbuterol [facility]) nebulizer solution 1.25 mg  1.25 mg Nebulization Q6H PRN Reubin Milan, MD      . LORazepam (ATIVAN) injection 1 mg  1 mg Intravenous Q4H PRN Reubin Milan, MD      . nicotine (NICODERM CQ - dosed in mg/24 hours) patch 21 mg  21 mg Transdermal Daily Virgel Manifold, MD   21 mg at 06/20/15 1350  . nicotine (NICODERM CQ - dosed in mg/24 hours) patch 21 mg  21 mg Transdermal Daily Reubin Milan, MD      . ondansetron [facility]) tablet 4 mg  4 mg Oral Q6H PRN Reubin Milan, MD       Or  . ondansetron [facility]) injection 4  mg  4 mg Intravenous Q6H PRN Reubin Milan, MD      . pantoprazole (PROTONIX) 80 mg in sodium chloride 0.9 % 250 mL (0.32 mg/mL) infusion  8 mg/hr Intravenous Continuous Virgel Manifold, MD   Stopped at 06/20/15 2356  . pneumococcal 23 valent vaccine (PNU-IMMUNE) injection 0.5 mL  0.5 mL Intramuscular Tomorrow-1000 Reubin Milan, MD      . polyethylene glycol (MIRALAX / GLYCOLAX) packet 17 g  17 g Oral Daily Reyne Dumas, MD      . predniSONE (DELTASONE) tablet 40 mg  40 mg Oral BID WC Truman Hayward, MD      . sodium chloride 0.9 % bolus 1,000 mL  1,000 mL Intravenous Once Virgel Manifold, MD      . sodium chloride 0.9 % bolus 500 mL  500 mL Intravenous Once Virgel Manifold, MD      . sulfamethoxazole-trimethoprim (BACTRIM) 320 mg in dextrose 5 % 500 mL IVPB  320 mg Intravenous 3 times per day Horatio Pel, RPH   320 mg at 06/20/15 2356    Allergies as of 06/20/2015  . (No Known Allergies)     Review of Systems:    This is positive for those things mentioned in the HPI,  All other review of systems are negative.       Physical Exam:  Vital signs in last 24 hours: Temp:  [97.3 F (36.3 C)-98.8 F (37.1 C)] 97.6 F (36.4 C) (11/19 0830) Pulse Rate:  [101-183] 110 (11/18 2001) Resp:  [16-36] 18 (11/19 0818) BP: (71-116)/(45-80) 116/80 mmHg (11/19 0818) SpO2:  [98 %-100 %] 99 % (11/19 0534) Weight:  [133 lb 13.1 oz (60.7 kg)-147 lb 14.9 oz (67.1 kg)] 133 lb 13.1 oz (60.7 kg) (11/18 1757) Last BM Date: 06/20/15  General:  Emaciated,in no acute distress Eyes:  anicteric. Neck:   supple w/o thyromegaly or mass.  Lungs: B/l crackles with rhonchi at bases Heart:  S1S2, no rubs, murmurs, gallops. Abdomen:  soft, non-tender, no hepatosplenomegaly, hernia, or mass and BS+.  Extremities:   no edema Skin   no rash. Neuro:  A&O x 3.  Psych:  appropriate mood and  Affect.   Data Reviewed:   LAB RESULTS:  Recent Labs  06/20/15 1200 06/21/15 0050  WBC 3.5* 3.8*    HGB 5.8* 6.2*  HCT 18.5* 20.2*  PLT 191 158   BMET  Recent Labs  06/20/15 1200  NA 136  K 3.7  CL 105  CO2 23  GLUCOSE 110*  BUN 22*  CREATININE 0.97  CALCIUM 7.4*   LFT  Recent Labs  06/20/15 1200  PROT 4.9*  ALBUMIN 1.4*  AST 39  ALT 17  ALKPHOS 119  BILITOT 0.3   PT/INR  Recent Labs  06/20/15 2002  LABPROT 15.3*  INR 1.19    STUDIES: Dg Chest 2 View  06/20/2015  CLINICAL DATA:  Dizziness as well as shortness of breath for 3 weeks worsening over the last few days. History of HIV infection. EXAM: CHEST  2 VIEW COMPARISON:  08/28/2014 FINDINGS: There is peribronchial interstitial type densities which extend into the lower lobes and right middle lobe, increased from the prior exam. There are no focal areas of lung consolidation to suggest lobar pneumonia. Lungs are otherwise clear. No pleural effusion or pneumothorax. Normal heart, mediastinum hila. Skeletal structures are unremarkable. IMPRESSION: 1. Interstitial type opacities noted along the bronchovascular bundles to the lower lobes and right middle lobe. This is consistent with bronchial inflammation or an interstitial type pneumonia. There is no evidence of lobar pneumonia or pulmonary edema. Electronically Signed   By: Lajean Manes M.D.   On: 06/20/2015 12:05   Ct Angio Chest Pe W/cm &/or Wo Cm  06/21/2015  CLINICAL DATA:  Shortness of breath.  Cough. EXAM: CT ANGIOGRAPHY CHEST WITH CONTRAST TECHNIQUE: Multidetector CT imaging of the chest was performed using the standard protocol during bolus administration of intravenous contrast. Multiplanar CT image reconstructions and MIPs were obtained to evaluate the vascular anatomy. CONTRAST:  145m OMNIPAQUE IOHEXOL 350 MG/ML SOLN COMPARISON:  None. FINDINGS: Mediastinum: Heart size is mildly enlarged. There is no pericardial effusion identified. The trachea is patent and appears midline. Normal appearance of the esophagus. Pre-vascular lymph node measures 1.7 cm,  image 41 of series 401. There is a right hilar node which is prominent measuring 1.1 cm, image 51 of series 401. The pulmonary artery is patent. The right and left pulmonary arteries are also patent. No lobar or segmental pulmonary artery filling defects identified. Lungs/Pleura: There is dense airspace consolidation with in both lower lobes compatible with pneumonia. There are scattered nodular opacities throughout both lungs. Many of these have a tree-in-bud configuration others are more solid in appearance. Solid nodule within the left upper lobe Measures 9 mm, image 31 of series 407. Upper Abdomen: The visualized portions of the liver and spleen are unremarkable. Musculoskeletal: Review of the visualized bony structures is unremarkable. No aggressive lytic or sclerotic bone lesions. Review of the MIP images confirms the above findings. IMPRESSION: 1. No evidence for pulmonary embolus. 2. Bilateral lower lobe airspace consolidation as well as diffuse bilateral tree-in-bud nodularity and scattered solid nodules. Findings are compatible with the clinical history of immunodeficiency with superimposed atypical infection. 3. Prominent mediastinal lymph nodes. Nonspecific in the setting of infection and HIV. Electronically Signed   By: TKerby MoorsM.D.   On: 06/21/2015 10:02     PREVIOUS ENDOSCOPIES:            None    Impression / Plan:   89yr M with h/o HIV/AIDS not on HAART admitted with pancytopenia, bilateral pneumonia and diarrhea. He is currently being followed by ID and is started on anti retro viral therapy  He is on Bactrim for possible PCP pneumonia and also broad-spectrum antibiotics. Heme consult and is being considered for ?bone marrow biopsy for evaluation of pancytopenia  Agree with checking stool culture, O&P  to rule out cryptosporidia, Giardia and Mycobacterium  No evidence of overt GI bleed Monitor Hgb and transfuse as need to maintain Hgb ~7 Will hold off endoscopic evaluation  for now and please call us back once his infectious work up is complete  Thank you for the consult    K. Denzil Magnuson , MD [phone] Mon-Fri 8a-5p [phone] after 5p, weekends, holidays  @  06/21/2015, 11:11 AM

## 2015-06-21 NOTE — Progress Notes (Addendum)
Patient seen and examined  Long-standing history of HIV and history of noncompliance  Patient seen by infectious disease Dr. Drucilla Schmidt, plan is to start antiretroviral therapy  CT and she'll showed bilateral lower lobe airspace consolidation with diffuse bilateral tree-in-bud nodularity, consistent with PCP pneumonia. Strep legionella negative. Continue treatment for PCP pneumonia  Mediastinal  lymphadenopathy  Patient does have some diarrhea therefore rule out C. difficile,  Patient has leukopenia and anemia, will transfuse a total of 3 units of packed red blood cells and follow CBC, may need hematology consultation and bone marrow biopsy if recommended by infectious disease

## 2015-06-21 NOTE — Progress Notes (Signed)
Colin Ina RN spoke with pt regarding PICC placement.  D/w risks and benefits and alternatives with pt and family at bedside.  Pt refusing PICC placement at this time.  RN aware and at bedside. To notify [facility] if pt changes his mind.

## 2015-06-21 NOTE — Consult Note (Signed)
[facility] for Infectious Disease    Date of Admission:  06/20/2015  Date of Consult:  06/21/2015  Reason for Consult: likely PCP pneumonia, pancytopenia in patient with HIV/AIDS Referring Physician: Dr. Allyson Sabal   HPI: Miguel Campos is an 44 y.o. male with HIV/AIDS diagnosed in 2001. He apparently has not been on meds since then. His CD4 has been 10 or worse recently. He tells me he was on 5-6 pills which makes me think he had very low CD4 count at start of therapy. This was at [facility] but I have no records and he cannot recall the regimen. He has been admitted with SOB, found to have bialteral infiltrates concerning for PCP vs CAP and is profoundly anemic, pancytopenic. He is having green loose bowel movement and has lymphadenopathy on CT scan.   Past Medical History  Diagnosis Date  . HIV (human immunodeficiency virus infection) [facility])     Past Surgical History  Procedure Laterality Date  . Lung surgery      Social History:  reports that he has been smoking Cigarettes.  He has been smoking about 1.00 pack per day. He does not have any smokeless tobacco history on file. He reports that he drinks alcohol. He reports that he uses illicit drugs (Marijuana).   History reviewed. No pertinent family history.  No Known Allergies   Medications: I have reviewed patients current medications as documented in Epic Anti-infectives    Start     Dose/Rate Route Frequency Ordered Stop   06/21/15 1200  dolutegravir (TIVICAY) tablet 50 mg     50 mg Oral Daily 06/21/15 1048     06/21/15 1200  emtricitabine-tenofovir AF (DESCOVY) 200-25 MG per tablet 1 tablet     1 tablet Oral Daily 06/21/15 1048     06/20/15 1556  azithromycin (ZITHROMAX) 500 MG injection    Comments:  Bonney Roussel   : cabinet override      06/20/15 1556 06/21/15 0359   06/20/15 1415  sulfamethoxazole-trimethoprim (BACTRIM) 320 mg in dextrose 5 % 500 mL IVPB     320 mg 346.7 mL/hr over 90  Minutes Intravenous 3 times per day 06/20/15 1408     06/20/15 1408  cefTRIAXone (ROCEPHIN) 1 G injection    Comments:  Theophilus Bones   : cabinet override      06/20/15 1408 06/20/15 1459   06/20/15 1345  cefTRIAXone (ROCEPHIN) 1 g in dextrose 5 % 50 mL IVPB     1 g 100 mL/hr over 30 Minutes Intravenous Every 24 hours 06/20/15 1332     06/20/15 1345  azithromycin (ZITHROMAX) 500 mg in dextrose 5 % 250 mL IVPB     500 mg 250 mL/hr over 60 Minutes Intravenous Every 24 hours 06/20/15 1332           ROS: as in HPI also + chills, weight loss, fatigue, palpitations, dizziness, abdominal pain, naussea, anorexia, cough with yellow sputum, otherwise remainder of 12 pt ROS is negative   Blood pressure 116/80, pulse 110, temperature 97.6 F (36.4 C), temperature source Oral, resp. rate 18, height 5' 7"  (1.702 m), weight 133 lb 13.1 oz (60.7 kg), SpO2 99 %. General: Alert and awake, oriented x3, not in any acute distress underweight HEENT: anicteric sclera,  EOMI, oropharynx clear and without exudate, hyperpigmentation in posterior OP Cardiovascular: tachy rate, normal r,  no murmur rubs or gallops Pulmonary: fairly clear to auscultation bilaterally with  some diminished BS at the bases Gastrointestinal: soft nontender, nondistended, normal bowel sounds, Musculoskeletal: no  clubbing or edema noted bilaterally Neuro: nonfocal, strength and sensation intact   Results for orders placed or performed during the hospital encounter of 06/20/15 (from the past 48 hour(s))  I-Stat CG4 Lactic Acid, ED     Status: Abnormal   Collection Time: 06/20/15 11:11 AM  Result Value Ref Range   Lactic Acid, Venous 3.05 (HH) 0.5 - 2.0 mmol/L   Comment NOTIFIED PHYSICIAN   Troponin I     Status: Abnormal   Collection Time: 06/20/15 11:50 AM  Result Value Ref Range   Troponin I 0.09 (H) <0.031 ng/mL    Comment:        PERSISTENTLY INCREASED TROPONIN VALUES IN THE RANGE OF 0.04-0.49 ng/mL CAN BE SEEN IN:        -UNSTABLE ANGINA       -CONGESTIVE HEART FAILURE       -MYOCARDITIS       -CHEST TRAUMA       -ARRYHTHMIAS       -LATE PRESENTING MYOCARDIAL INFARCTION       -COPD   CLINICAL FOLLOW-UP RECOMMENDED.   TSH     Status: None   Collection Time: 06/20/15 11:50 AM  Result Value Ref Range   TSH 4.009 0.350 - 4.500 uIU/mL    Comment: Performed at [facility]  Vitamin B12     Status: Abnormal   Collection Time: 06/20/15 11:50 AM  Result Value Ref Range   Vitamin B-12 1479 (H) 180 - 914 pg/mL    Comment: (NOTE) This assay is not validated for testing neonatal or myeloproliferative syndrome specimens for Vitamin B12 levels. Performed at [facility]   Folate     Status: Abnormal   Collection Time: 06/20/15 11:50 AM  Result Value Ref Range   Folate 5.1 (L) >5.9 ng/mL    Comment: Performed at [facility]  Iron and TIBC     Status: Abnormal   Collection Time: 06/20/15 11:50 AM  Result Value Ref Range   Iron 17 (L) 45 - 182 ug/dL   TIBC NOT CALCULATED 250 - 450 ug/dL    Comment: REPEATED TO VERIFY   Saturation Ratios NOT CALCULATED 17.9 - 39.5 %   UIBC NOT CALCULATED ug/dL    Comment: Performed at [facility]  Ferritin     Status: Abnormal   Collection Time: 06/20/15 11:50 AM  Result Value Ref Range   Ferritin 1183 (H) 24 - 336 ng/mL    Comment: Performed at [facility]  Comprehensive metabolic panel     Status: Abnormal   Collection Time: 06/20/15 12:00 PM  Result Value Ref Range   Sodium 136 135 - 145 mmol/L   Potassium 3.7 3.5 - 5.1 mmol/L   Chloride 105 101 - 111 mmol/L   CO2 23 22 - 32 mmol/L   Glucose, Bld 110 (H) 65 - 99 mg/dL   BUN 22 (H) 6 - 20 mg/dL   Creatinine, Ser 0.97 0.61 - 1.24 mg/dL   Calcium 7.4 (L) 8.9 - 10.3 mg/dL   Total Protein 4.9 (L) 6.5 - 8.1 g/dL   Albumin 1.4 (L) 3.5 - 5.0 g/dL   AST 39 15 - 41 U/L   ALT 17 17 - 63 U/L   Alkaline Phosphatase 119 38 - 126 U/L   Total Bilirubin 0.3 0.3 - 1.2 mg/dL   GFR calc  non Af Amer >60 >60 mL/min  GFR calc Af Amer >60 >60 mL/min    Comment: (NOTE) The eGFR has been calculated using the CKD EPI equation. This calculation has not been validated in all clinical situations. eGFR's persistently <60 mL/min signify possible Chronic Kidney Disease.    Anion gap 8 5 - 15  Magnesium     Status: Abnormal   Collection Time: 06/20/15 12:00 PM  Result Value Ref Range   Magnesium 1.6 (L) 1.7 - 2.4 mg/dL  CBC with Differential     Status: Abnormal   Collection Time: 06/20/15 12:00 PM  Result Value Ref Range   WBC 3.5 (L) 4.0 - 10.5 K/uL    Comment: WHITE COUNT CONFIRMED ON SMEAR   RBC 2.53 (L) 4.22 - 5.81 MIL/uL   Hemoglobin 5.8 (LL) 13.0 - 17.0 g/dL    Comment: REPEATED TO VERIFY CRITICAL RESULT CALLED TO, READ BACK BY AND VERIFIED WITH: TERI MASTEN RN @1233  06/20/15 OLSONM    HCT 18.5 (L) 39.0 - 52.0 %   MCV 73.1 (L) 78.0 - 100.0 fL   MCH 22.9 (L) 26.0 - 34.0 pg   MCHC 31.4 30.0 - 36.0 g/dL   RDW 21.1 (H) 11.5 - 15.5 %   Platelets 191 150 - 400 K/uL    Comment: SPECIMEN CHECKED FOR CLOTS PLATELET COUNT CONFIRMED BY SMEAR PLATELETS APPEAR ADEQUATE    Neutrophils Relative % 92 %   Lymphocytes Relative 2 %   Monocytes Relative 5 %   Eosinophils Relative 0 %   Basophils Relative 0 %   Band Neutrophils 1 %   Neutro Abs 3.2 1.7 - 7.7 K/uL   Lymphs Abs 0.1 (L) 0.7 - 4.0 K/uL   Monocytes Absolute 0.2 0.1 - 1.0 K/uL   Eosinophils Absolute 0.0 0.0 - 0.7 K/uL   Basophils Absolute 0.0 0.0 - 0.1 K/uL   RBC Morphology SPHEROCYTES     Comment: ELLIPTOCYTES TEARDROP CELLS    Smear Review LARGE PLATELETS PRESENT     Comment: SPECIMEN CHECKED FOR CLOTS GIANT PLATELETS SEEN PLATELETS APPEAR ADEQUATE   T-helper cells (CD4) count (not at [facility])     Status: Abnormal   Collection Time: 06/20/15 12:00 PM  Result Value Ref Range   CD4 T Cell Abs <10 (L) 400 - 2700 /uL   CD4 % Helper T Cell 1 (L) 33 - 55 %    Comment: Performed at [facility]    Reticulocytes     Status: Abnormal   Collection Time: 06/20/15 12:00 PM  Result Value Ref Range   Retic Ct Pct 1.3 0.4 - 3.1 %   RBC. 2.58 (L) 4.22 - 5.81 MIL/uL   Retic Count, Manual 33.5 19.0 - 186.0 K/uL    Comment: Performed at [facility]  Lactate dehydrogenase     Status: Abnormal   Collection Time: 06/20/15 12:00 PM  Result Value Ref Range   LDH 371 (H) 98 - 192 U/L    Comment: Performed at [facility]  MRSA PCR Screening     Status: None   Collection Time: 06/20/15  7:10 PM  Result Value Ref Range   MRSA by PCR NEGATIVE NEGATIVE    Comment:        The GeneXpert MRSA Assay (FDA approved for NASAL specimens only), is one component of a comprehensive MRSA colonization surveillance program. It is not intended to diagnose MRSA infection nor to guide or monitor treatment for MRSA infections.   Phosphorus     Status: None  Collection Time: 06/20/15  8:02 PM  Result Value Ref Range   Phosphorus 3.3 2.5 - 4.6 mg/dL  Troponin I     Status: Abnormal   Collection Time: 06/20/15  8:02 PM  Result Value Ref Range   Troponin I 0.04 (H) <0.031 ng/mL    Comment:        PERSISTENTLY INCREASED TROPONIN VALUES IN THE RANGE OF 0.04-0.49 ng/mL CAN BE SEEN IN:       -UNSTABLE ANGINA       -CONGESTIVE HEART FAILURE       -MYOCARDITIS       -CHEST TRAUMA       -ARRYHTHMIAS       -LATE PRESENTING MYOCARDIAL INFARCTION       -COPD   CLINICAL FOLLOW-UP RECOMMENDED.   Protime-INR     Status: Abnormal   Collection Time: 06/20/15  8:02 PM  Result Value Ref Range   Prothrombin Time 15.3 (H) 11.6 - 15.2 seconds   INR 1.19 0.00 - 1.49  Brain natriuretic peptide     Status: Abnormal   Collection Time: 06/20/15  8:02 PM  Result Value Ref Range   B Natriuretic Peptide 243.8 (H) 0.0 - 100.0 pg/mL  Lactic acid, plasma     Status: Abnormal   Collection Time: 06/20/15  8:16 PM  Result Value Ref Range   Lactic Acid, Venous 2.6 (HH) 0.5 - 2.0 mmol/L    Comment:  CRITICAL RESULT CALLED TO, READ BACK BY AND VERIFIED WITH: A PETTIFORD,RN 2136 06/20/2015 WBOND   Lactate dehydrogenase     Status: None   Collection Time: 06/20/15  9:50 PM  Result Value Ref Range   LDH 154 98 - 192 U/L  Type and screen [place]     Status: None (Preliminary result)   Collection Time: 06/20/15  9:50 PM  Result Value Ref Range   ABO/RH(D) O POS    Antibody Screen NEG    Sample Expiration 06/23/2015    Unit Number [id number]    Blood Component Type RBC LR PHER2    Unit division 00    Status of Unit ISSUED    Transfusion Status OK TO TRANSFUSE    Crossmatch Result Compatible    Unit Number [id number]    Blood Component Type RBC LR PHER1    Unit division 00    Status of Unit ISSUED    Transfusion Status OK TO TRANSFUSE    Crossmatch Result Compatible    Unit Number [id number]    Blood Component Type RBC LR PHER2    Unit division 00    Status of Unit ISSUED    Transfusion Status OK TO TRANSFUSE    Crossmatch Result Compatible   Prepare RBC     Status: None   Collection Time: 06/20/15  9:50 PM  Result Value Ref Range   Order Confirmation ORDER PROCESSED BY BLOOD BANK   Urinalysis, Routine w reflex microscopic (not at [facility])     Status: Abnormal   Collection Time: 06/21/15 12:15 AM  Result Value Ref Range   Color, Urine AMBER (A) YELLOW    Comment: BIOCHEMICALS MAY BE AFFECTED BY COLOR   APPearance CLEAR CLEAR   Specific Gravity, Urine 1.028 1.005 - 1.030   pH 6.0 5.0 - 8.0   Glucose, UA NEGATIVE NEGATIVE mg/dL   Hgb urine dipstick NEGATIVE NEGATIVE   Bilirubin Urine NEGATIVE NEGATIVE   Ketones, ur NEGATIVE NEGATIVE mg/dL   Protein, ur 30 (A) NEGATIVE mg/dL  Nitrite NEGATIVE NEGATIVE   Leukocytes, UA NEGATIVE NEGATIVE  Urine microscopic-add on     Status: Abnormal   Collection Time: 06/21/15 12:15 AM  Result Value Ref Range   Squamous Epithelial / LPF 0-5 (A) NONE SEEN    Comment: Please note change in reference range.    WBC, UA 0-5 0 - 5 WBC/hpf    Comment: Please note change in reference range.   RBC / HPF NONE SEEN 0 - 5 RBC/hpf    Comment: Please note change in reference range.   Bacteria, UA RARE (A) NONE SEEN    Comment: Please note change in reference range.   Casts HYALINE CASTS (A) NEGATIVE  Strep pneumoniae urinary antigen     Status: None   Collection Time: 06/21/15 12:17 AM  Result Value Ref Range   Strep Pneumo Urinary Antigen NEGATIVE NEGATIVE    Comment:        Infection due to S. pneumoniae cannot be absolutely ruled out since the antigen present may be below the detection limit of the test.   CBC WITH DIFFERENTIAL     Status: Abnormal   Collection Time: 06/21/15 12:50 AM  Result Value Ref Range   WBC 3.8 (L) 4.0 - 10.5 K/uL   RBC 2.74 (L) 4.22 - 5.81 MIL/uL   Hemoglobin 6.2 (LL) 13.0 - 17.0 g/dL    Comment: REPEATED TO VERIFY CRITICAL RESULT CALLED TO, READ BACK BY AND VERIFIED WITH: AMANDA RUTHERFORD,RN AT 0120 06/21/15. K.PAXTON    HCT 20.2 (L) 39.0 - 52.0 %   MCV 73.7 (L) 78.0 - 100.0 fL   MCH 22.6 (L) 26.0 - 34.0 pg   MCHC 30.7 30.0 - 36.0 g/dL   RDW 20.9 (H) 11.5 - 15.5 %   Platelets 158 150 - 400 K/uL    Comment: SPECIMEN CHECKED FOR CLOTS REPEATED TO VERIFY CONSISTENT WITH PREVIOUS RESULT    Neutrophils Relative % 83 %   Lymphocytes Relative 8 %   Monocytes Relative 8 %   Eosinophils Relative 1 %   Basophils Relative 0 %   Neutro Abs 3.2 1.7 - 7.7 K/uL   Lymphs Abs 0.3 (L) 0.7 - 4.0 K/uL   Monocytes Absolute 0.3 0.1 - 1.0 K/uL   Eosinophils Absolute 0.0 0.0 - 0.7 K/uL   Basophils Absolute 0.0 0.0 - 0.1 K/uL   RBC Morphology POLYCHROMASIA PRESENT     Comment: TEARDROP CELLS ELLIPTOCYTES BURR CELLS   Magnesium     Status: None   Collection Time: 06/21/15 12:50 AM  Result Value Ref Range   Magnesium 2.0 1.7 - 2.4 mg/dL  Troponin I     Status: None   Collection Time: 06/21/15 12:50 AM  Result Value Ref Range   Troponin I 0.03 <0.031 ng/mL    Comment:         NO INDICATION OF MYOCARDIAL INJURY.   Troponin I     Status: None   Collection Time: 06/21/15  7:25 AM  Result Value Ref Range   Troponin I <0.03 <0.031 ng/mL    Comment:        NO INDICATION OF MYOCARDIAL INJURY.   Prepare RBC     Status: None   Collection Time: 06/21/15  8:14 AM  Result Value Ref Range   Order Confirmation ORDER PROCESSED BY BLOOD BANK    @BRIEFLABTABLE (sdes,specrequest,cult,reptstatus)   ) Recent Results (from the past 720 hour(s))  MRSA PCR Screening     Status: None   Collection Time: 06/20/15  7:10  PM  Result Value Ref Range Status   MRSA by PCR NEGATIVE NEGATIVE Final    Comment:        The GeneXpert MRSA Assay (FDA approved for NASAL specimens only), is one component of a comprehensive MRSA colonization surveillance program. It is not intended to diagnose MRSA infection nor to guide or monitor treatment for MRSA infections.      Impression/Recommendation  Principal Problem:   SVT (supraventricular tachycardia) (HCC) Active Problems:   Anemia   Atypical pneumonia   AIDS (HCC)   Tobacco abuse disorder   Hypomagnesemia   Lactic acidosis   Miguel Campos is a 44 y.o. male with  HIV/AIDS, likely PCP PNA vs CAP, pancytopenia, loose stools.  #1 PCP vs CAP: --continue IV bactrim and will add prednisone --ok to continue CAP coverage for now as well  #2 Pancytopenia: advised send AFB blood culture because with his loose stools abdominal pain LA could be c/w disseminated M Avium  I agree that Bone Marrow biopsy may ultimately be needed  #3 AIDS:  Will start Tivicay and Descovy, genotype pending, he is on bactrim and azitrho presently I have given Mom my card and we will have him followup in our clinic. Will also engage bridge counselor and bring Pharma drug assistance cards, he will likely lose his insurance from his job having not worked for several months and then would need ADAP  #4 Diarrhea: could be from M avium vs  cryptosporidium, giardia can send for these O and P. CDI also being checked  I spent greater than 80 minutes with the patient including greater than 50% of time in face to face counsel of the patient re his HIV/AIDS, new regimen, his PCP vs CAP his pancytopenia and in coordination of his care.     06/21/2015, 11:37 AM   Thank you so much for this interesting consult  [place] for [place] [phone] (pager) [phone] (office) 06/21/2015, 11:37 AM  Rhina [place] 06/21/2015, 11:37 AM

## 2015-06-21 NOTE — Progress Notes (Signed)
Patient noncompliant on day shift. Refused PICC line placement, agreeing to secondary peripheral line placement. Upon arrival of IV team, patient then refused Peripheral line placement. Delay in treatment due to patient noncompliance. A dose of Bactrim was missed due to patient refusal. Finally, able to convince patient to have secondary IV line placed to allow for administration of antibiotics. Patient further refused Echo this evening. Tech states she will return 06/22/2015 to try again.  Milford Cage, RN

## 2015-06-21 NOTE — Progress Notes (Signed)
Initial Nutrition Assessment  DOCUMENTATION CODES:   Severe malnutrition in context of chronic illness  INTERVENTION:    Ensure Enlive PO TID, each supplement provides 350 kcal and 20 grams of protein  NUTRITION DIAGNOSIS:   Malnutrition related to chronic illness as evidenced by percent weight loss, severe depletion of muscle mass (17% weight loss within 6 months).  GOAL:   Patient will meet greater than or equal to 90% of their needs  MONITOR:   PO intake, Supplement acceptance, Labs, Weight trends, I & O's  REASON FOR ASSESSMENT:   Malnutrition Screening Tool    ASSESSMENT:   44 y.o. male with a past medical history of HIV not on antiretroviral therapy, tobacco abuse disorder who presented to [facility] with a history of progressively worse dyspnea and fatigue for about a month. He said that he was evaluated by his primary care provider recently who found him to be anemic and referred him to see a gastroenterologist, who was schedule him to have an EGD due to intense postprandial epigastric pain associated with nausea, but no emesis. He denies melena, or hematochezia, but notices that his stools, which are usually loose, have been dark green in color recently. He has had a significant weight loss in the past 2 months.   Being treated for PCP PNA, also plans for 3 units of PRBC's, when he allows RN to place IV. Patient has refused a PICC. Stool being sent for C. Diff and O & P. He is hungry and wants to eat. Plans for GI workup.    Patient reports good intake at home. Suspect intake has been less than his estimated calorie and protein needs, given 17% weight loss in the past 6 months. He would not agree for RD to perform a full nutrition focused physical exam, but RD did notice severe wasting of temple and clavicle muscles. Per MD notes, patient appears cachectic. Patient with severe PCM.   Diet Order:  Diet regular Room service appropriate?: Yes; Fluid consistency:: Thin  Skin:   Reviewed, no issues  Last BM:  11/18  Height:   Ht Readings from Last 1 Encounters:  06/20/15 5\' 7"  (1.702 m)    Weight:   Wt Readings from Last 1 Encounters:  06/20/15 133 lb 13.1 oz (60.7 kg)    Ideal Body Weight:  67.3 kg  BMI:  Body mass index is 20.95 kg/(m^2).  Estimated Nutritional Needs:   Kcal:  2000-2200  Protein:  100-110 gm  Fluid:  2 L  EDUCATION NEEDS:   No education needs identified at this time  Molli Barrows, [place], [place], [place] Pager [phone] After Hours Pager [phone]

## 2015-06-22 ENCOUNTER — Inpatient Hospital Stay (HOSPITAL_COMMUNITY): Payer: BLUE CROSS/BLUE SHIELD

## 2015-06-22 DIAGNOSIS — B59 Pneumocystosis: Secondary | ICD-10-CM

## 2015-06-22 DIAGNOSIS — B20 Human immunodeficiency virus [HIV] disease: Secondary | ICD-10-CM | POA: Diagnosis present

## 2015-06-22 DIAGNOSIS — Z720 Tobacco use: Secondary | ICD-10-CM

## 2015-06-22 DIAGNOSIS — I471 Supraventricular tachycardia: Secondary | ICD-10-CM

## 2015-06-22 DIAGNOSIS — E872 Acidosis: Secondary | ICD-10-CM

## 2015-06-22 DIAGNOSIS — J189 Pneumonia, unspecified organism: Secondary | ICD-10-CM

## 2015-06-22 DIAGNOSIS — D509 Iron deficiency anemia, unspecified: Secondary | ICD-10-CM

## 2015-06-22 LAB — COMPREHENSIVE METABOLIC PANEL
ALK PHOS: 95 U/L (ref 38–126)
ALT: 15 U/L — AB (ref 17–63)
ANION GAP: 3 — AB (ref 5–15)
AST: 30 U/L (ref 15–41)
Albumin: <1 g/dL — ABNORMAL LOW (ref 3.5–5.0)
BILIRUBIN TOTAL: 0.1 mg/dL — AB (ref 0.3–1.2)
BUN: 12 mg/dL (ref 6–20)
CALCIUM: 6.7 mg/dL — AB (ref 8.9–10.3)
CO2: 21 mmol/L — ABNORMAL LOW (ref 22–32)
CREATININE: 0.67 mg/dL (ref 0.61–1.24)
Chloride: 112 mmol/L — ABNORMAL HIGH (ref 101–111)
GFR calc non Af Amer: >60 mL/min (ref >60)
Glucose, Bld: 106 mg/dL — ABNORMAL HIGH (ref 65–99)
Potassium: 4.2 mmol/L (ref 3.5–5.1)
Sodium: 136 mmol/L (ref 135–145)
TOTAL PROTEIN: 3.8 g/dL — AB (ref 6.5–8.1)

## 2015-06-22 LAB — CBC WITH DIFFERENTIAL/PLATELET
BASOS PCT: 0 %
Basophils Absolute: 0 10*3/uL (ref 0.0–0.1)
EOS PCT: 0 %
Eosinophils Absolute: 0 10*3/uL (ref 0.0–0.7)
HEMATOCRIT: 30.8 % — AB (ref 39.0–52.0)
Hemoglobin: 9.9 g/dL — ABNORMAL LOW (ref 13.0–17.0)
LYMPHS ABS: 0.2 10*3/uL — AB (ref 0.7–4.0)
Lymphocytes Relative: 5 %
MCH: 24.4 pg — AB (ref 26.0–34.0)
MCHC: 32.1 g/dL (ref 30.0–36.0)
MCV: 75.9 fL — AB (ref 78.0–100.0)
MONO ABS: 0.2 10*3/uL (ref 0.1–1.0)
Monocytes Relative: 4 %
NEUTROS ABS: 4.4 10*3/uL (ref 1.7–7.7)
Neutrophils Relative %: 91 %
Platelets: 140 10*3/uL — ABNORMAL LOW (ref 150–400)
RBC: 4.06 MIL/uL — ABNORMAL LOW (ref 4.22–5.81)
RDW: 19.2 % — AB (ref 11.5–15.5)
WBC: 4.8 10*3/uL (ref 4.0–10.5)

## 2015-06-22 LAB — TYPE AND SCREEN
ABO/RH(D): O POS
Antibody Screen: NEGATIVE
UNIT DIVISION: 0
UNIT DIVISION: 0
Unit division: 0

## 2015-06-22 LAB — HEPATITIS PANEL, ACUTE
HCV AB: 0.4 {s_co_ratio} (ref 0.0–0.9)
HEP A IGM: NEGATIVE
Hep B C IgM: NEGATIVE
Hepatitis B Surface Ag: NEGATIVE

## 2015-06-22 LAB — MAGNESIUM: Magnesium: 1.6 mg/dL — ABNORMAL LOW (ref 1.7–2.4)

## 2015-06-22 MED ORDER — POLYETHYLENE GLYCOL 3350 17 G PO PACK: 17.0000 g | PACK | Freq: Every day | ORAL | Status: DC | PRN

## 2015-06-22 MED ORDER — AZITHROMYCIN 600 MG PO TABS
1200.0000 mg | ORAL_TABLET | ORAL | Status: DC
Start: 2015-06-27 — End: 2015-06-24

## 2015-06-22 MED ORDER — ALPRAZOLAM 0.25 MG PO TABS: 0.2500 mg | ORAL_TABLET | Freq: Three times a day (TID) | ORAL | Status: DC | PRN

## 2015-06-22 MED ORDER — PANTOPRAZOLE SODIUM 40 MG IV SOLR
40.0000 mg | Freq: Two times a day (BID) | INTRAVENOUS | Status: DC
  Administered 2015-06-22 – 2015-06-23 (×3): 40 mg via INTRAVENOUS
  Filled 2015-06-22 (×3): qty 40

## 2015-06-22 MED ORDER — OXYCODONE-ACETAMINOPHEN 5-325 MG PO TABS
1.0000 | ORAL_TABLET | Freq: Four times a day (QID) | ORAL | Status: DC | PRN
  Filled 2015-06-22: qty 1

## 2015-06-22 MED ORDER — HYDROCODONE-ACETAMINOPHEN 7.5-325 MG PO TABS
1.0000 | ORAL_TABLET | Freq: Four times a day (QID) | ORAL | Status: DC | PRN
  Administered 2015-06-22 – 2015-06-23 (×3): 1 via ORAL
  Filled 2015-06-22 (×4): qty 1

## 2015-06-22 MED ORDER — ACETAMINOPHEN 325 MG PO TABS: 650.0000 mg | ORAL_TABLET | Freq: Four times a day (QID) | ORAL | Status: DC | PRN

## 2015-06-22 MED ORDER — SULFAMETHOXAZOLE-TRIMETHOPRIM 400-80 MG PO TABS
2.0000 | ORAL_TABLET | Freq: Three times a day (TID) | ORAL | Status: DC
  Administered 2015-06-22 (×2): 2 via ORAL
  Filled 2015-06-22 (×6): qty 2

## 2015-06-22 MED ORDER — MAGNESIUM SULFATE 2 GM/50ML IV SOLN
2.0000 g | Freq: Once | INTRAVENOUS | Status: AC
Start: 2015-06-22 — End: 2015-06-22
  Administered 2015-06-22: 2 g via INTRAVENOUS
  Filled 2015-06-22: qty 50
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
## 2015-06-22 NOTE — Progress Notes (Signed)
  Echocardiogram 2D Echocardiogram has been performed.  Miguel Campos 06/22/2015, 12:37 PM

## 2015-06-23 ENCOUNTER — Encounter (HOSPITAL_COMMUNITY): Payer: Self-pay | Admitting: Physician Assistant

## 2015-06-23 DIAGNOSIS — E46 Unspecified protein-calorie malnutrition: Secondary | ICD-10-CM | POA: Diagnosis present

## 2015-06-23 DIAGNOSIS — Z21 Asymptomatic human immunodeficiency virus [HIV] infection status: Secondary | ICD-10-CM

## 2015-06-23 DIAGNOSIS — D61818 Other pancytopenia: Secondary | ICD-10-CM

## 2015-06-23 DIAGNOSIS — R197 Diarrhea, unspecified: Secondary | ICD-10-CM

## 2015-06-23 DIAGNOSIS — I5042 Chronic combined systolic (congestive) and diastolic (congestive) heart failure: Secondary | ICD-10-CM

## 2015-06-23 DIAGNOSIS — I5043 Acute on chronic combined systolic (congestive) and diastolic (congestive) heart failure: Secondary | ICD-10-CM

## 2015-06-23 LAB — CBC WITH DIFFERENTIAL/PLATELET
Basophils Absolute: 0 10*3/uL (ref 0.0–0.1)
Basophils Relative: 0 %
EOS PCT: 0 %
Eosinophils Absolute: 0 10*3/uL (ref 0.0–0.7)
HEMATOCRIT: 34.1 % — AB (ref 39.0–52.0)
Hemoglobin: 11.5 g/dL — ABNORMAL LOW (ref 13.0–17.0)
LYMPHS ABS: 0.3 10*3/uL — AB (ref 0.7–4.0)
LYMPHS PCT: 5 %
MCH: 25.4 pg — AB (ref 26.0–34.0)
MCHC: 33.7 g/dL (ref 30.0–36.0)
MCV: 75.4 fL — AB (ref 78.0–100.0)
MONO ABS: 0.3 10*3/uL (ref 0.1–1.0)
Monocytes Relative: 6 %
NEUTROS ABS: 4.9 10*3/uL (ref 1.7–7.7)
Neutrophils Relative %: 89 %
PLATELETS: 155 10*3/uL (ref 150–400)
RBC: 4.52 MIL/uL (ref 4.22–5.81)
RDW: 20.2 % — AB (ref 11.5–15.5)
WBC: 5.5 10*3/uL (ref 4.0–10.5)

## 2015-06-23 LAB — COMPREHENSIVE METABOLIC PANEL
ALK PHOS: 81 U/L (ref 38–126)
ALT: 16 U/L — ABNORMAL LOW (ref 17–63)
AST: 34 U/L (ref 15–41)
Albumin: <1 g/dL — ABNORMAL LOW (ref 3.5–5.0)
Anion gap: 7 (ref 5–15)
BILIRUBIN TOTAL: 0.3 mg/dL (ref 0.3–1.2)
BUN: 18 mg/dL (ref 6–20)
CALCIUM: 7.1 mg/dL — AB (ref 8.9–10.3)
CO2: 17 mmol/L — ABNORMAL LOW (ref 22–32)
Chloride: 110 mmol/L (ref 101–111)
Creatinine, Ser: 0.9 mg/dL (ref 0.61–1.24)
GFR calc Af Amer: >60 mL/min (ref >60)
GLUCOSE: 118 mg/dL — AB (ref 65–99)
POTASSIUM: 4.5 mmol/L (ref 3.5–5.1)
Sodium: 134 mmol/L — ABNORMAL LOW (ref 135–145)
TOTAL PROTEIN: 3.9 g/dL — AB (ref 6.5–8.1)

## 2015-06-23 LAB — CD4/CD8 (T-HELPER/T-SUPPRESSOR CELL)
CD4%: 2 % — AB (ref 30.0–60.0)
CD8 T Cell Abs: 130 /uL — ABNORMAL LOW (ref 230–1000)
CD8tox: 58 % — ABNORMAL HIGH (ref 15.0–40.0)
RATIO: 0.03 — AB (ref 1.0–3.0)
Total lymphocyte count: 220 /uL — ABNORMAL LOW (ref 1000–4000)

## 2015-06-23 LAB — LEGIONELLA PNEUMOPHILA SEROGP 1 UR AG: L. PNEUMOPHILA SEROGP 1 UR AG: NEGATIVE

## 2015-06-23 LAB — MAGNESIUM: Magnesium: 2 mg/dL (ref 1.7–2.4)

## 2015-06-23 MED ORDER — PANTOPRAZOLE SODIUM 40 MG PO TBEC
40.0000 mg | DELAYED_RELEASE_TABLET | Freq: Two times a day (BID) | ORAL | Status: DC
  Administered 2015-06-23 – 2015-06-24 (×2): 40 mg via ORAL
  Filled 2015-06-23 (×2): qty 1

## 2015-06-23 MED ORDER — FERROUS SULFATE 325 (65 FE) MG PO TABS
325.0000 mg | ORAL_TABLET | Freq: Every day | ORAL | Status: DC
  Administered 2015-06-24: 325 mg via ORAL
  Filled 2015-06-23: qty 1

## 2015-06-23 MED ORDER — SULFAMETHOXAZOLE-TRIMETHOPRIM 800-160 MG PO TABS
2.0000 | ORAL_TABLET | Freq: Three times a day (TID) | ORAL | Status: DC
  Administered 2015-06-23 – 2015-06-24 (×4): 2 via ORAL
  Filled 2015-06-23 (×6): qty 2

## 2015-06-23 MED ORDER — CARVEDILOL 3.125 MG PO TABS
3.1250 mg | ORAL_TABLET | Freq: Two times a day (BID) | ORAL | Status: DC
  Administered 2015-06-23 – 2015-06-24 (×2): 3.125 mg via ORAL
  Filled 2015-06-23 (×2): qty 1

## 2015-06-23 MED ORDER — RISAQUAD PO CAPS
2.0000 | ORAL_CAPSULE | Freq: Every day | ORAL | Status: DC
  Administered 2015-06-23 – 2015-06-24 (×2): 2 via ORAL
  Filled 2015-06-23 (×2): qty 2

## 2015-06-23 NOTE — Progress Notes (Signed)
[facility] Progress Note    Patient: Miguel Campos     [id number]  DOB: [birth year]     DOA: 06/20/2015 Date of Service: the patient was seen and examined on 06/23/2015 Day 3  of admission.  Subjective: Cough is soft. Chest pain or abdominal pain. No dizziness or lightheadedness. Nutrition: Adequate appetite tolerating oral diet  Activity: Ambulating in the room  Last BM: 06/23/2015  Assessment and Plan: 1. 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 Bactrim was switched to oral Currently no hypoxia. no sputum culture as of yet. Also continue community-acquired coverage at present with ceftriaxone and azithromycin.  2.SVT (supraventricular tachycardia) (HCC) Currently sinus rhythm without tachycardia on telemetry. Echocardiogram shows ejection fraction less than 35% with cardiomyopathy. Troponins negative. Cardiology consulted. We will continue to monitor.  3  AIDS ([place]) Infectious disease consulted. We will follow their recommendation. Patient is started back on HAART medication. CD4 less than 10, CD4 percent 1% 06/20/2015 Hepatitis panel negative.  4  Tobacco abuse disorder Continue nicotine patch. When necessary nebulizers as ordered.  5  Hypomagnesemia Resolved.  6  metabolic acidosis Initially lactic acidosis which is resolved. We will continue to closely monitor.  7  Anemia due to bone marrow failure (HCC) 8  Absolute anemia H&H improved after transfusion and remained stable. We will continue to closely monitor. No evidence of active bleeding. Replace folic acid, 0000000 normal Iron level below normal will replace as well.  9  Diarrhea of presumed infectious origin  C. difficile is negative. Patient did have some blood in the stool and H&H remained stable. GI has been consulted and currently considering infectious diarrhea. Pending workup for Giardia and  cryptosporidium. Continue Protonix  every 12 hours IV.  10  Abdominal pain, chronic, epigastric  continue IV Protonix.  11 chronic leg pain. Currently is taking Norco at home and resume.  DVT Prophylaxis: mechanical compression device. Nutrition: Regular diet   Advance goals of care discussion: Full code   Brief Summary of Hospitalization:  HPI: 44 year old male with past history of HIV and tobacco abuse not taking any medication presented to [facility] [place] with worsening shortness of breath and fatigue along with cough and anemia. Patient also had diarrhea. Patient was found to be having SVT initially which converted with adenosine, in the ER. with this tachycardia, anemia, hypotension the patient was initially admitted in the step down unit. He did not have electrical cardioversion.  Daily update: Admitted to step down 06/20/2015. Infectious disease and gastroenterology consultation on 06/21/2015. IV fluids discontinued 06/22/2015.  Bactrim switched to oral 06/23/2015. Out of the stepdown unit to telemetry floor.  Consultants: gastroenterology and infectious disease. Procedures: Echocardiogram pending  Antibiotics: Anti-infectives    Start     Dose/Rate Route Frequency Ordered Stop   06/27/15 1000  azithromycin (ZITHROMAX) tablet 1,200 mg     1,200 mg Oral Weekly 06/22/15 1713     06/23/15 1000  sulfamethoxazole-trimethoprim (BACTRIM DS,SEPTRA DS) 800-160 MG per tablet 2 tablet     2 tablet Oral 3 times daily 06/23/15 0829     06/22/15 1715  sulfamethoxazole-trimethoprim (BACTRIM,SEPTRA) 400-80 MG per tablet 2 tablet  Status:  Discontinued     2 tablet Oral 3 times per day 06/22/15 1712 06/23/15 0829   06/21/15 1200  dolutegravir (TIVICAY) tablet 50 mg     50 mg Oral Daily 06/21/15 1048     06/21/15 1200  emtricitabine-tenofovir AF (  DESCOVY) 200-25 MG per tablet 1 tablet     1 tablet Oral Daily 06/21/15 1048     06/20/15 1556  azithromycin (ZITHROMAX) 500 MG injection      Comments:  Bonney Roussel   : cabinet override      06/20/15 1556 06/21/15 0359   06/20/15 1415  sulfamethoxazole-trimethoprim (BACTRIM) 320 mg in dextrose 5 % 500 mL IVPB  Status:  Discontinued     320 mg 346.7 mL/hr over 90 Minutes Intravenous 3 times per day 06/20/15 1408 06/22/15 1711   06/20/15 1408  cefTRIAXone (ROCEPHIN) 1 G injection    Comments:  Theophilus Bones   : cabinet override      06/20/15 1408 06/20/15 1459   06/20/15 1345  cefTRIAXone (ROCEPHIN) 1 g in dextrose 5 % 50 mL IVPB     1 g 100 mL/hr over 30 Minutes Intravenous Every 24 hours 06/20/15 1332     06/20/15 1345  azithromycin (ZITHROMAX) 500 mg in dextrose 5 % 250 mL IVPB  Status:  Discontinued     500 mg 250 mL/hr over 60 Minutes Intravenous Every 24 hours 06/20/15 1332 06/22/15 1712     Family Communication: family was present at bedside, opportunity was given to ask question and all questions were answered satisfactorily at the time of interview.  Disposition:  Expected discharge date: 06/24/2015  Barriers to safe discharge:  medication arrangement on discharge    Intake/Output Summary (Last 24 hours) at 06/23/15 1508 Last data filed at 06/23/15 0700  Gross per 24 hour  Intake    460 ml  Output      0 ml  Net    460 ml   Filed Weights   06/20/15 1400 06/20/15 1757  Weight: 67.1 kg (147 lb 14.9 oz) 60.7 kg (133 lb 13.1 oz)    Objective: Physical Exam: Filed Vitals:   06/22/15 2349 06/23/15 0352 06/23/15 0835 06/23/15 1317  BP: 113/80 109/82 137/91 117/76  Pulse: 80 74 90 100  Temp: 97.5 F (36.4 C) 97.7 F (36.5 C) 98.1 F (36.7 C) 97.5 F (36.4 C)  TempSrc: Oral Oral Oral Oral  Resp: 17 17 16 16   Height:      Weight:      SpO2:   95% 100%     General: Appear in mild distress, no Rash; Oral Mucosa moist. Cardiovascular: S1 and S2 Present, no Murmur, no JVD Respiratory: Bilateral Air entry present and faint Crackles, no wheezes Abdomen: Bowel Sound present, Soft and no  tenderness Extremities: no Pedal edema, no calf tenderness  Data Reviewed: CBC:  Recent Labs Lab 06/20/15 1200 06/21/15 0050 06/22/15 0603 06/23/15 0231  WBC 3.5* 3.8* 4.8 5.5  NEUTROABS 3.2 3.2 4.4 4.9  HGB 5.8* 6.2* 9.9* 11.5*  HCT 18.5* 20.2* 30.8* 34.1*  MCV 73.1* 73.7* 75.9* 75.4*  PLT 191 158 140* [id number]   Basic Metabolic Panel:  Recent Labs Lab 06/20/15 1200 06/20/15 2002 06/21/15 0050 06/21/15 1018 06/22/15 0603 06/23/15 0231  NA 136  --   --  135 136 134*  K 3.7  --   --  3.5 4.2 4.5  CL 105  --   --  107 112* 110  CO2 23  --   --  25 21* 17*  GLUCOSE 110*  --   --  76 106* 118*  BUN 22*  --   --  13 12 18   CREATININE 0.97  --   --  0.71 0.67 0.90  CALCIUM 7.4*  --   --  7.1* 6.7* 7.1*  MG 1.6*  --  2.0  --  1.6* 2.0  PHOS  --  3.3  --   --   --   --    Liver Function Tests:  Recent Labs Lab 06/20/15 1200 06/21/15 1018 06/22/15 0603 06/23/15 0231  AST 39 34 30 34  ALT 17 17 15* 16*  ALKPHOS 119 100 95 81  BILITOT 0.3 0.4 0.1* 0.3  PROT 4.9* 4.4* 3.8* 3.9*  ALBUMIN 1.4* 1.2* <1.0* <1.0*   No results for input(s): LIPASE, AMYLASE in the last 168 hours. No results for input(s): AMMONIA in the last 168 hours.  Cardiac Enzymes:  Recent Labs Lab 06/20/15 1150 06/20/15 2002 06/21/15 0050 06/21/15 0725  TROPONINI 0.09* 0.04* 0.03 <0.03   BNP (last 3 results)  Recent Labs  06/20/15 2002  BNP 243.8*    ProBNP (last 3 results) No results for input(s): PROBNP in the last 8760 hours.   CBG: No results for input(s): GLUCAP in the last 168 hours.  Recent Results (from the past 240 hour(s))  MRSA PCR Screening     Status: None   Collection Time: 06/20/15  7:10 PM  Result Value Ref Range Status   MRSA by PCR NEGATIVE NEGATIVE Final    Comment:        The GeneXpert MRSA Assay (FDA approved for NASAL specimens only), is one component of a comprehensive MRSA colonization surveillance program. It is not intended to diagnose  MRSA infection nor to guide or monitor treatment for MRSA infections.   Culture, blood (routine x 2) Call MD if unable to obtain prior to antibiotics being given     Status: None (Preliminary result)   Collection Time: 06/20/15  8:02 PM  Result Value Ref Range Status   Specimen Description BLOOD RIGHT ARM  Final   Special Requests BOTTLES DRAWN AEROBIC AND ANAEROBIC 5CC  Final   Culture NO GROWTH 3 DAYS  Final   Report Status PENDING  Incomplete  Culture, blood (routine x 2) Call MD if unable to obtain prior to antibiotics being given     Status: None (Preliminary result)   Collection Time: 06/20/15  8:16 PM  Result Value Ref Range Status   Specimen Description BLOOD RIGHT ARM  Final   Special Requests BOTTLES DRAWN AEROBIC AND ANAEROBIC 5CC  Final   Culture NO GROWTH 3 DAYS  Final   Report Status PENDING  Incomplete  C difficile quick scan w PCR reflex     Status: None   Collection Time: 06/21/15  4:29 PM  Result Value Ref Range Status   C Diff antigen NEGATIVE NEGATIVE Final   C Diff toxin NEGATIVE NEGATIVE Final   C Diff interpretation Negative for toxigenic C. difficile  Final     Studies: No results found.   Scheduled Meds: . acidophilus  2 capsule Oral Daily  . [START ON 06/27/2015] azithromycin  1,200 mg Oral Weekly  . cefTRIAXone (ROCEPHIN)  IV  1 g Intravenous Q24H  . dolutegravir  50 mg Oral Daily  . emtricitabine-tenofovir AF  1 tablet Oral Daily  . feeding supplement (ENSURE ENLIVE)  237 mL Oral TID BM  . folic acid  1 mg Oral Daily  . Influenza vac split quadrivalent PF  0.5 mL Intramuscular Tomorrow-1000  . nicotine  21 mg Transdermal Daily  . pantoprazole  40 mg Oral BID  . pneumococcal 23 valent vaccine  0.5 mL Intramuscular Tomorrow-1000  . predniSONE  40 mg Oral BID WC  .  sulfamethoxazole-trimethoprim  2 tablet Oral TID   Continuous Infusions:   Time spent: 35 minutes   Author: Berle Mull, MD [facility] Pager: [phone] 06/23/2015  3:08 PM  If 7PM-7AM, please contact night-coverage at [email or web address], password [facility]
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
## 2015-06-23 NOTE — Clinical Documentation Improvement (Signed)
Hospitalist and/or Infectious Disease  Query 1 of 2 Possible Clinical Conditions:  - Severe Malnutrition  - Other condition  - Unable to clinically determine  Clinical Information: "Severe malnutrition in context of chronic illness.  Malnutrition related to chronic illness as evidenced by percent weight loss, severe depletion of muscle mass (17% weight loss within 6 months). Patient reports good intake at home. Suspect intake has been less than his estimated calorie and protein needs, given 17% weight loss in the past 6 months. He would not agree for RD to perform a full nutrition focused physical exam, but RD did notice severe wasting of temple and clavicle muscles. Per MD notes, patient appears cachectic. Patient with severe PCM" is documented in the Registered Dietician assessment dated 06/21/15 at 12:02 pm.   Query 2 of 2 Possible Clinical Conditions:  - Chronic Systolic and Diastolic Heart Failure, including any associated condition(s) or cause(s)  - Other condition  - Unable to clinically determine  Clinical Information: Echo 06/21/17  Impressions:- Moderate to severe LV dysfunction; grade 1 diastolic dysfunction; mild biatrial enlargement; mildly &reduced RV function; trivial pericardial effusion.       Please exercise your independent, professional judgment when responding. A specific answer is not anticipated or expected.   Thank You,  Erling Conte  RN BSN CCDS [phone] Health Information Management [place]

## 2015-06-23 NOTE — Care Management Note (Signed)
Case Management Note  Patient Details  Name: Miguel Campos MRN: [id number] Date of Birth: 04-May-1971  Subjective/Objective:    Received CM referral, talked with pt who states he needs to get SSI, food stamps, and Medicaid.  On questioning, pt states he is on medical leave from work and has several more weeks.  Has decided he will not be able to return to work in several weeks and wants assistance with applications.  Informed pt that he or family member will need to make applications @ DSS in [facility], provided print-out with contact information.   If physician feels that pt will be unable to work x one year, he will need to apply @ [place] office for disability.                        Expected Discharge Plan:  Home/Self Care  Discharge planning Services  CM Consult   Status of Service:  Completed, signed off  Girard Cooter, [place] 06/23/2015, 9:44 AM

## 2015-06-23 NOTE — Progress Notes (Signed)
Initial Nutrition Assessment  DOCUMENTATION CODES:   Severe malnutrition in context of chronic illness  INTERVENTION:   Patient is currently receiving Ensure Enlive po TID, each supplement provides 350 kcal and 20 grams of protein   NUTRITION DIAGNOSIS:   Malnutrition related to chronic illness as evidenced by percent weight loss, severe depletion of muscle mass, moderate depletion of body fat.  ongoing  GOAL:   Patient will meet greater than or equal to 90% of their needs  Not yet met  MONITOR:   PO intake, I & O's, Labs, Supplement acceptance, Weight trends  REASON FOR ASSESSMENT:   Consult Assessment of nutrition requirement/status  ASSESSMENT:   44 y.o. male with a past medical history of HIV not on antiretroviral therapy, tobacco abuse disorder who presented to [facility] with a history of progressively worse dyspnea and fatigue for about a month. He said that he was evaluated by his primary care provider recently who found him to be anemic and referred him to see a gastroenterologist, who was schedule him to have an EGD due to intense postprandial epigastric pain associated with nausea, but no emesis. He denies melena, or hematochezia, but notices that his stools, which are usually loose, have been dark green in color recently. He has had a significant weight loss in the past 2 months.  Pt was recently identified via Malnutrition Screening Tool and assessed, RD consulted to for assessment.  Pt refused NFPE, however, RD did note severe muscle wasting at clavicles and temples, in conjunction with 37#/21% severe wt loss in 10 months, pt exhibits severe malnutrition in context of chronic illness (HIV/AIDS).  Patient has consistently refused PICC & peripheral line placement, but finally agreed to peripheral line on 11/19  Pt has a good appetite and reports good PO intake at home. Likely pt has been unable to maintain weight due to increased needs from HIV/AIDS. Pt currently being  treated for PCP PNA as well.  Current PO intake is 100%, follow for continued PO intake and supplement acceptance.  Diet Order:  Diet regular Room service appropriate?: Yes; Fluid consistency:: Thin  Skin:  Reviewed, no issues  Last BM:  06/22/2015  Height:   Ht Readings from Last 1 Encounters:  06/20/15 5' 7"  (1.702 m)    Weight:   Wt Readings from Last 1 Encounters:  06/20/15 133 lb 13.1 oz (60.7 kg)    Ideal Body Weight:  67.3 kg  BMI:  Body mass index is 20.95 kg/(m^2).  Estimated Nutritional Needs:   Kcal:  2000-2200  Protein:  100-110 grams  Fluid:  2L  EDUCATION NEEDS:   No education needs identified at this time  Satira Anis. Kyna Blahnik, MS, RD LDN After Hours/Weekend Pager [phone]

## 2015-06-23 NOTE — Care Management Note (Signed)
Case Management Note  Patient Details  Name: Miguel Campos MRN: [id number] Date of Birth: Dec 24, 1970  Subjective/Objective:     CM following for progression and d/c planning.               Action/Plan: 06/23/2015 Noted referral to assist with meds, however this pt has commercial insurance still in effect per pt ,  with a medication program. The [facility] program will not work for pt with insurance nor will it cover any HIV drugs. Hopefully the ID clinic will be able to assist. Dr Marlowe Sax notified. Per CM notes this pt plans to begin applying for disability etc. Unclear at this time as to why he feels that he will be unable to return to work.   Expected Discharge Date:      06/24/2015            Expected Discharge Plan:  Home/Self Care  In-House Referral:     Discharge planning Services  CM Consult  Post Acute Care Choice:  NA Choice offered to:  NA  DME Arranged:    DME Agency:     HH Arranged:    HH Agency:     Status of Service:  Completed, signed off  Medicare Important Message Given:    Date Medicare IM Given:    Medicare IM give by:    Date Additional Medicare IM Given:    Additional Medicare Important Message give by:     If discussed at [place] of Stay Meetings, dates discussed:    Additional Comments:  Adron Bene, RN 06/23/2015, 1:47 PM

## 2015-06-23 NOTE — Care Management (Signed)
Call placed to insurance verification to clarify if pt actually has insurance. Also have asked case management assistance to contact pt insurance provider to validate if he has medication coverage at this time. Hopefully will have clear answer by tomorrow , Jun 24, 2015. Discussions with pt have not be very clear as to his actual resources. Pt most concerned about obtaining disability benefits.  Jasmine Pang RN MPH, case manager, [phone]

## 2015-06-24 DIAGNOSIS — D6189 Other specified aplastic anemias and other bone marrow failure syndromes: Secondary | ICD-10-CM

## 2015-06-24 LAB — HIV-1 RNA, QUALITATIVE, TMA: HIV-1 RNA, Qualitative, TMA: POSITIVE — AB

## 2015-06-24 MED ORDER — FOLIC ACID 1 MG PO TABS: 1.0000 mg | ORAL_TABLET | Freq: Every day | ORAL | Status: DC

## 2015-06-24 MED ORDER — PREDNISONE 20 MG PO TABS: ORAL_TABLET | ORAL | Status: DC

## 2015-06-24 MED ORDER — SULFAMETHOXAZOLE-TRIMETHOPRIM 800-160 MG PO TABS: 2.0000 | ORAL_TABLET | Freq: Three times a day (TID) | ORAL | Status: DC

## 2015-06-24 MED ORDER — AZITHROMYCIN 600 MG PO TABS: 1200.0000 mg | ORAL_TABLET | ORAL | Status: DC

## 2015-06-24 MED ORDER — EMTRICITABINE-TENOFOVIR AF 200-25 MG PO TABS: 1.0000 | ORAL_TABLET | Freq: Every day | ORAL | Status: DC

## 2015-06-24 MED ORDER — DOLUTEGRAVIR SODIUM 50 MG PO TABS: 50.0000 mg | ORAL_TABLET | Freq: Every day | ORAL | Status: DC

## 2015-06-24 MED ORDER — ENSURE ENLIVE PO LIQD: 237.0000 mL | Freq: Three times a day (TID) | ORAL | Status: DC

## 2015-06-24 MED ORDER — CARVEDILOL 3.125 MG PO TABS: 3.1250 mg | ORAL_TABLET | Freq: Two times a day (BID) | ORAL | Status: DC

## 2015-06-24 NOTE — Progress Notes (Signed)
    Subjective:  Feels well. No chest pain or shortness or breath this am. Mother present today at the bedside, anticipating DC home today.  Objective:  Vital Signs in the last 24 hours: Temp:  [97.5 F (36.4 C)-97.6 F (36.4 C)] 97.6 F (36.4 C) (11/22 0500) Pulse Rate:  [77-100] 77 (11/22 0500) Resp:  [16-18] 18 (11/22 0500) BP: (104-134)/(64-88) 134/88 mmHg (11/22 0500) SpO2:  [98 %-100 %] 99 % (11/22 0500) Weight:  [134 lb 14.7 oz (61.2 kg)] 134 lb 14.7 oz (61.2 kg) (11/21 2100)  Intake/Output from previous day: 11/21 0701 - 11/22 0700 In: 410 [P.O.:360; IV Piggyback:50] Out: 0   Physical Exam: Pt is alert and oriented, thin male in NAD HEENT: normal Neck: JVP - normal Lungs: fine crackles bilaterally CV: RRR without murmur or gallop Abd: soft, NT Ext: 2+ pedal edema Skin: warm/dry no rash   Lab Results:  Recent Labs  06/22/15 0603 06/23/15 0231  WBC 4.8 5.5  HGB 9.9* 11.5*  PLT 140* 155    Recent Labs  06/22/15 0603 06/23/15 0231  NA 136 134*  K 4.2 4.5  CL 112* 110  CO2 21* 17*  GLUCOSE 106* 118*  BUN 12 18  CREATININE 0.67 0.90   No results for input(s): TROPONINI in the last 72 hours.  Invalid input(s): CK, MB  Assessment/Plan:  Acute on chronic systolic CHF: improved. Coreg added at low-dose yesterday - pt tolerating. No ACE but consider as outpatient if BP will tolerate. BP has been intermittently low in hospital. Appears stable from CV perspective. Will arrange outpatient follow-up.   Sherren Mocha, M.D. 06/24/2015, 9:20 AM

## 2015-06-24 NOTE — Care Management (Addendum)
Have verified with financial counselor Caryl Pina [place])  that this pt's insurance is still active, therefore he does have medication benefits.  Adron Bene RN MPH, case manager, [phone] Have learned from hospital per certification also that this pt insurance is active and that he has meet his out of pocket deductable , therefore he should have minimal copays.   Jasmine Pang RN MPH, case manager, [phone]

## 2015-06-24 NOTE — Evaluation (Signed)
Physical Therapy Evaluation and Discharge Patient Details Name: Miguel Campos MRN: [id number] DOB: [birth year] Today's Date: 06/24/2015   History of Present Illness  44 year old African-American male appear older than his stated age with PMH of tobacco abuse, remote history of polysubstance abuse, and long-standing history of HIV not on anti-retroviral medication presented with abdominal pain, severe anemia, LE edema and palpitation, found to be in SVT in ED, also been treated for HIV and PCP PNA. Hemacult positive, GI want to hold off on workup. Echo shows EF 30%. Cardiology consulted for LV dysfunction and SVT  Clinical Impression  *Pt functioning at baseline at indep level. Pt at minimal falls risk as indicated by score of 23/24 on DGI. Pt with no further acute PT needs at this time. Pt safe to d/c home once medically stable. Educated on LE edema management via LE there ex and keeping LEs elevated. PT SIGNING OFF. Please re-consult if needed in future.    Follow Up Recommendations No PT follow up    Equipment Recommendations  None recommended by PT    Recommendations for Other Services       Precautions / Restrictions Precautions Precautions: None Precaution Comments: bilat LE pitting edema Restrictions Weight Bearing Restrictions: No      Mobility  Bed Mobility Overal bed mobility: Independent                Transfers Overall transfer level: Independent Equipment used: None             General transfer comment: steady  Ambulation/Gait Ambulation/Gait assistance: Independent Ambulation Distance (Feet): 300 Feet Assistive device: None Gait Pattern/deviations: WFL(Within Functional Limits) Gait velocity: slightly decreased   General Gait Details: mild SOB, no episodes of LOB  Stairs Stairs: Yes Stairs assistance: Modified independent (Device/Increase time) Stair Management: One rail Right Number of Stairs: 10 General stair comments: no episodes of  LOB  Wheelchair Mobility    Modified Rankin (Stroke Patients Only)       Balance Overall balance assessment: No apparent balance deficits (not formally assessed)                               Standardized Balance Assessment Standardized Balance Assessment : Dynamic Gait Index   Dynamic Gait Index Level Surface: Normal Change in Gait Speed: Normal Gait with Horizontal Head Turns: Normal Gait with Vertical Head Turns: Normal Gait and Pivot Turn: Normal Step Over Obstacle: Normal Step Around Obstacles: Normal Steps: Mild Impairment Total Score: 23       Pertinent Vitals/Pain Pain Assessment: No/denies pain    Home Living Family/patient expects to be discharged to:: Private residence Living Arrangements: Parent Available Help at Discharge: Family;Available 24 hours/day Type of Home: House Home Access: Stairs to enter Entrance Stairs-Rails: Left Entrance Stairs-Number of Steps: 3 Home Layout: Multi-level        Prior Function Level of Independence: Independent         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   Dominant Hand: Right    Extremity/Trunk Assessment   Upper Extremity Assessment: Overall WFL for tasks assessed           Lower Extremity Assessment: Generalized weakness (noted bilat LE weakness)      Cervical / Trunk Assessment: Normal  Communication   Communication: No difficulties  Cognition Arousal/Alertness: Awake/alert Behavior During Therapy: WFL for tasks assessed/performed Overall Cognitive Status: Within Functional Limits for tasks assessed  General Comments      Exercises        Assessment/Plan    PT Assessment Patent does not need any further PT services  PT Diagnosis Generalized weakness   PT Problem List    PT Treatment Interventions     PT Goals (Current goals can be found in the Care Plan section) Acute Rehab PT Goals Patient Stated Goal: hoem today PT  Goal Formulation: All assessment and education complete, DC therapy    Frequency     Barriers to discharge        Co-evaluation               End of Session   Activity Tolerance: Patient tolerated treatment well Patient left: in chair;with call bell/phone within reach Nurse Communication: Mobility status         Time: 0725-0740 PT Time Calculation (min) (ACUTE ONLY): 15 min   Charges:   PT Evaluation $Initial PT Evaluation Tier I: 1 Procedure     PT G CodesKingsley Callander 06/24/2015, 7:55 AM   Kittie Plater, PT, DPT Pager #: [phone] Office #: [phone]

## 2015-06-24 NOTE — Progress Notes (Signed)
Discharged papers explained and given to patient.Discharged on stable condition.No complaint,no open skin issues.Questions were answered satisfactorily at the time of discharged.
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
## 2015-06-25 LAB — CULTURE, BLOOD (ROUTINE X 2)
Culture: NO GROWTH
Culture: NO GROWTH

## 2015-06-25 LAB — GIARDIA/CRYPTOSPORIDIUM EIA
Cryptosporidium EIA: NEGATIVE
Giardia Ag, Stl: NEGATIVE

## 2015-06-29 LAB — REFLEX TO GENOSURE(R) MG: HIV GENOSURE(R) MG PDF: 0

## 2015-06-29 LAB — HIV-1 RNA ULTRAQUANT REFLEX TO GENTYP+
HIV-1 RNA BY PCR: 71700 copies/mL
HIV-1 RNA Quant, Log: 4.856 log10copy/mL

## 2015-07-08 ENCOUNTER — Ambulatory Visit (INDEPENDENT_AMBULATORY_CARE_PROVIDER_SITE_OTHER): Payer: 59 | Admitting: Physician Assistant

## 2015-07-08 ENCOUNTER — Encounter: Payer: Self-pay | Admitting: Physician Assistant

## 2015-07-08 VITALS — BP 114/66 | HR 92 | Ht 67.0 in | Wt 119.0 lb

## 2015-07-08 DIAGNOSIS — I5042 Chronic combined systolic (congestive) and diastolic (congestive) heart failure: Secondary | ICD-10-CM

## 2015-07-08 DIAGNOSIS — I471 Supraventricular tachycardia: Secondary | ICD-10-CM

## 2015-07-08 DIAGNOSIS — R634 Abnormal weight loss: Secondary | ICD-10-CM

## 2015-07-08 DIAGNOSIS — I429 Cardiomyopathy, unspecified: Secondary | ICD-10-CM

## 2015-07-08 MED ORDER — CARVEDILOL 6.25 MG PO TABS: 6.2500 mg | ORAL_TABLET | Freq: Two times a day (BID) | ORAL | Status: DC

## 2015-07-08 NOTE — Assessment & Plan Note (Signed)
Patient has lost 15 pounds since hospitalization. He has multiple medical issues compared to be doing to this. Recommend he follow-up with GI.

## 2015-07-08 NOTE — Assessment & Plan Note (Signed)
Heart failure compensated. He still has edema in his ankles but it is improving and felt secondary to hypoalbuminemia
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
## 2015-07-08 NOTE — Assessment & Plan Note (Signed)
Patient had SVT in the hospital in the setting of pneumonia. Heart rate is 94. Will increase carvedilol to 6.25 mg twice a day. Follow-up with Dr. Burt Knack in 2-3 months.

## 2015-07-08 NOTE — Patient Instructions (Signed)
Medication Instructions:  Your physician has recommended you make the following change in your medication:  INCREASE Carvedilol to 6.25mg  Twice daily  Labwork: None ordered  Testing/Procedures: Your physician has requested that you have an echocardiogram. Echocardiography is a painless test that uses sound waves to create images of your heart. It provides your doctor with information about the size and shape of your heart and how well your heart's chambers and valves are working. This procedure takes approximately one hour. There are no restrictions for this procedure. (To be scheduled in 3-4 months)  Follow-Up: Your physician recommends that you schedule a follow-up appointment in: 2 months with Dr.Cooper   Any Other Special Instructions Will Be Listed Below (If Applicable). Follow up with your Gastroenterologist asap    If you need a refill on your cardiac medications before your next appointment, please call your pharmacy.

## 2015-07-08 NOTE — Assessment & Plan Note (Signed)
Patient has an EF of 30-35% with global hypokinesis. We'll repeat 2-D echo in 3-4 months. No evidence of heart failure and exam.

## 2015-07-09 ENCOUNTER — Telehealth: Payer: Self-pay | Admitting: Internal Medicine

## 2015-07-09 ENCOUNTER — Telehealth: Payer: Self-pay | Admitting: Physician Assistant

## 2015-07-09 NOTE — Telephone Encounter (Signed)
Spoke with Elta Guadeloupe at [employer] [facility] calling with update on pt's AFB blood culture. Elta Guadeloupe states that the cause for the positive culture has not been identified but that it is not TB. Testing is being continued to identify the source but Elta Guadeloupe wanted to give the update that it was not TB. Spoke with Bonney Leitz, PA-C to make her aware. Will forward update to Ermalinda Barrios, PA-C.

## 2015-07-09 NOTE — Telephone Encounter (Signed)
Patient: Miguel Campos   [id number]  PCP: Isaias Cowman, PA-C   DOB: [birth year]  DOS: 07/09/2015  Reason for call: abnormal blood culture. Patient's AFB culture came back positive for AFB, morphologically not Mycobacterium TB. Identification is still pending. AFB culture, blood  Status: Preliminaryresult Visible to patient:  Not Released Nextappt: None         2wk ago    Specimen Description BLOOD RIGHT ANTECUBITAL   Special Requests ACID FAST BACILLI 5CC   Culture ACID FAST BACILLI PRESENT, NOT MORPHOLOGICALLY SIMILAR TO MYCOBACTERIUM TUBERCULOSIS  AFB IDENTIFICATION TO FOLLOW  CRITICAL RESULT CALLED TO, READ BACK BY AND VERIFIED WITH: Caryl Bis RN 15:35 07/09/15 ([email or web address])       Report Status PENDING         Attempts to call the patient on his mobile number given in demographics was not successful, left generic voicemail to call [facility] and request to discuss with myself to discuss with me about the plan.  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 patient will need to get into ID clinic as soon as possible to start treatment for the same. nonemergent condition at present patient has reoccurrence or worsening symptoms. In care everywhere I do not see any follow-up with [facility] infectious disease as of yet.  Author: Berle Mull, MD [facility] 07/09/2015 4:04 PM

## 2015-07-10 ENCOUNTER — Encounter: Payer: Self-pay | Admitting: Gastroenterology

## 2015-07-10 ENCOUNTER — Telehealth: Payer: Self-pay | Admitting: Physician Assistant

## 2015-07-10 DIAGNOSIS — R634 Abnormal weight loss: Secondary | ICD-10-CM

## 2015-07-10 NOTE — Telephone Encounter (Signed)
New message     Pt saw Sharyn Lull a few days ago.  She referred him to [facility] for wt loss.  They told the pt we had to call with the referral and they could see him.  Please schedule appt and call pt with day and time

## 2015-07-10 NOTE — Telephone Encounter (Signed)
Called mother and let her know that a referral has been ordered and sent to [facility]

## 2015-07-10 NOTE — Telephone Encounter (Signed)
Order placed in EPIC for referral to [facility]

## 2015-07-14 ENCOUNTER — Ambulatory Visit (INDEPENDENT_AMBULATORY_CARE_PROVIDER_SITE_OTHER): Payer: 59 | Admitting: Gastroenterology

## 2015-07-14 ENCOUNTER — Encounter: Payer: Self-pay | Admitting: Gastroenterology

## 2015-07-14 VITALS — BP 94/60 | HR 72

## 2015-07-14 DIAGNOSIS — R195 Other fecal abnormalities: Secondary | ICD-10-CM | POA: Diagnosis not present

## 2015-07-14 DIAGNOSIS — R634 Abnormal weight loss: Secondary | ICD-10-CM | POA: Diagnosis not present

## 2015-07-14 NOTE — Patient Instructions (Signed)
Please go to the Emergency Room for evaluation today. Follow up with Dr Silverio Decamp as needed.

## 2015-07-14 NOTE — Progress Notes (Signed)
    History of Present Illness: This is a 44 year old male who is known to Dr. Silverio Decamp from a hospital consult in November who recommended colonoscopy and EGD after his infectious evaluation was completed. He is accompanied by his mother and brother. Presents for evaluation of weight loss, poor appetite, Hemoccult positive stool and iron deficiency anemia. Pt has HIV for many years and was not on treatment. He hospitalized at [facility] in November. He was evaluated by ID and HIV medications were recommended. He was diagnosed with PCP pneumonia on treatment started. He states that he has been unable to fill all the recommended HIV prescriptions due to insurance coverage problems. He has a dry mouth, poor appetite, weakness and he is taking very little by mouth. He was having problems with diarrhea however they have improved. Stool for C diff, giardia and cryptosporidia were negative in November. He was found to have Hemoccult positive stool and iron deficiency anemia in November. He has CHF with an EF of 30-35%. He has had a 15 lb weight loss over the past several weeks. He was apparently referred to [facility] Disease clinic and has an appointment on 12/27. Patient and patients mother feel that he is doing so poorly that he needs help prior to this appt. His health is deteriorating and he continues to lose weight.  Abd/pelvic CT 05/27/2015 IMPRESSION: 1. No acute finding. 2. Moderate to large retained stool. 3. Mildly enlarged mesenteric, retroperitoneal, and inguinal lymph nodes which could be from patient's HIV, other systemic infection, or lymphoproliferative disease. 4. Airway debris and atelectasis in lower lobes, greater on the left. There may be superimposed pneumonia.   Current Medications, Allergies, Past Medical History, Past Surgical History, Family History and Social History were reviewed in [employer] record.  Physical Exam: General: Well developed, thin,  chronically ill appearing in a wheel chair Head: Normocephalic and atraumatic Eyes:  sclerae anicteric, EOMI Ears: Normal auditory acuity Mouth: No deformity or lesions Lungs: Clear throughout to auscultation Heart: Regular rate and rhythm; no murmurs, rubs or bruits Abdomen: Soft, non tender and non distended. No masses, hepatosplenomegaly or hernias noted. Normal Bowel sounds Musculoskeletal: Symmetrical with no gross deformities  Pulses:  Normal pulses noted Extremities: No clubbing, cyanosis, edema or deformities noted Neurological: Alert oriented x 4, grossly nonfocal Psychological:  Alert and cooperative. Normal mood and affect  Assessment and Recommendations:  1. AIDS, HIV not adequately treated. Poor appetite, weight loss and severe malnutrition. I advised the patient and his family to go immediately to the ED for evaluation and consideration of hospital admission for further evaluation and treatment. The patient and his mother seemed very frustrated with this recommendation.  2. Heme positive stool and iron deficiency anemia. Colonoscopy and EGD should be considered at some point in the near future when his medical status is stable for GI procedures and sedation. GI follow up with Dr. Silverio Decamp.   3. CHF with EF 30-35%. SVT in November.   4. PCP pneumonia, both upper lobes.

## 2015-07-18 ENCOUNTER — Inpatient Hospital Stay (HOSPITAL_COMMUNITY)
Admission: EM | Admit: 2015-07-18 | Discharge: 2015-07-31 | DRG: 974 | Disposition: A | Discharge status: Hospice | Payer: 59 | Attending: Internal Medicine | Admitting: Internal Medicine

## 2015-07-18 ENCOUNTER — Emergency Department (HOSPITAL_COMMUNITY): Payer: 59

## 2015-07-18 ENCOUNTER — Encounter (HOSPITAL_COMMUNITY): Payer: Self-pay

## 2015-07-18 DIAGNOSIS — E43 Unspecified severe protein-calorie malnutrition: Secondary | ICD-10-CM | POA: Diagnosis present

## 2015-07-18 DIAGNOSIS — R627 Adult failure to thrive: Secondary | ICD-10-CM | POA: Diagnosis present

## 2015-07-18 DIAGNOSIS — D6189 Other specified aplastic anemias and other bone marrow failure syndromes: Secondary | ICD-10-CM | POA: Diagnosis not present

## 2015-07-18 DIAGNOSIS — Y95 Nosocomial condition: Secondary | ICD-10-CM | POA: Diagnosis present

## 2015-07-18 DIAGNOSIS — E46 Unspecified protein-calorie malnutrition: Secondary | ICD-10-CM | POA: Diagnosis present

## 2015-07-18 DIAGNOSIS — R1013 Epigastric pain: Secondary | ICD-10-CM

## 2015-07-18 DIAGNOSIS — R197 Diarrhea, unspecified: Secondary | ICD-10-CM | POA: Diagnosis present

## 2015-07-18 DIAGNOSIS — D61818 Other pancytopenia: Secondary | ICD-10-CM | POA: Diagnosis not present

## 2015-07-18 DIAGNOSIS — L03211 Cellulitis of face: Secondary | ICD-10-CM | POA: Diagnosis present

## 2015-07-18 DIAGNOSIS — K056 Periodontal disease, unspecified: Secondary | ICD-10-CM | POA: Diagnosis present

## 2015-07-18 DIAGNOSIS — M6250 Muscle wasting and atrophy, not elsewhere classified, unspecified site: Secondary | ICD-10-CM | POA: Diagnosis present

## 2015-07-18 DIAGNOSIS — G8929 Other chronic pain: Secondary | ICD-10-CM | POA: Diagnosis present

## 2015-07-18 DIAGNOSIS — E876 Hypokalemia: Secondary | ICD-10-CM | POA: Diagnosis present

## 2015-07-18 DIAGNOSIS — A312 Disseminated mycobacterium avium-intracellulare complex (DMAC): Secondary | ICD-10-CM | POA: Diagnosis present

## 2015-07-18 DIAGNOSIS — K029 Dental caries, unspecified: Secondary | ICD-10-CM | POA: Diagnosis present

## 2015-07-18 DIAGNOSIS — Z720 Tobacco use: Secondary | ICD-10-CM | POA: Diagnosis present

## 2015-07-18 DIAGNOSIS — N390 Urinary tract infection, site not specified: Secondary | ICD-10-CM | POA: Diagnosis present

## 2015-07-18 DIAGNOSIS — F4320 Adjustment disorder, unspecified: Secondary | ICD-10-CM | POA: Diagnosis present

## 2015-07-18 DIAGNOSIS — Z6823 Body mass index (BMI) 23.0-23.9, adult: Secondary | ICD-10-CM

## 2015-07-18 DIAGNOSIS — C469 Kaposi's sarcoma, unspecified: Secondary | ICD-10-CM | POA: Diagnosis present

## 2015-07-18 DIAGNOSIS — R64 Cachexia: Secondary | ICD-10-CM | POA: Diagnosis not present

## 2015-07-18 DIAGNOSIS — E860 Dehydration: Secondary | ICD-10-CM | POA: Diagnosis present

## 2015-07-18 DIAGNOSIS — I471 Supraventricular tachycardia: Secondary | ICD-10-CM | POA: Diagnosis present

## 2015-07-18 DIAGNOSIS — I959 Hypotension, unspecified: Secondary | ICD-10-CM | POA: Diagnosis present

## 2015-07-18 DIAGNOSIS — A09 Infectious gastroenteritis and colitis, unspecified: Secondary | ICD-10-CM

## 2015-07-18 DIAGNOSIS — I509 Heart failure, unspecified: Secondary | ICD-10-CM | POA: Diagnosis present

## 2015-07-18 DIAGNOSIS — L89152 Pressure ulcer of sacral region, stage 2: Secondary | ICD-10-CM | POA: Diagnosis present

## 2015-07-18 DIAGNOSIS — Z9114 Patient's other noncompliance with medication regimen: Secondary | ICD-10-CM | POA: Diagnosis not present

## 2015-07-18 DIAGNOSIS — F0280 Dementia in other diseases classified elsewhere without behavioral disturbance: Secondary | ICD-10-CM | POA: Diagnosis present

## 2015-07-18 DIAGNOSIS — I951 Orthostatic hypotension: Secondary | ICD-10-CM | POA: Diagnosis present

## 2015-07-18 DIAGNOSIS — F17210 Nicotine dependence, cigarettes, uncomplicated: Secondary | ICD-10-CM | POA: Diagnosis present

## 2015-07-18 DIAGNOSIS — F4329 Adjustment disorder with other symptoms: Secondary | ICD-10-CM | POA: Insufficient documentation

## 2015-07-18 DIAGNOSIS — E162 Hypoglycemia, unspecified: Secondary | ICD-10-CM | POA: Diagnosis present

## 2015-07-18 DIAGNOSIS — I5042 Chronic combined systolic (congestive) and diastolic (congestive) heart failure: Secondary | ICD-10-CM | POA: Diagnosis present

## 2015-07-18 DIAGNOSIS — Q821 Xeroderma pigmentosum: Secondary | ICD-10-CM | POA: Insufficient documentation

## 2015-07-18 DIAGNOSIS — K2951 Unspecified chronic gastritis with bleeding: Secondary | ICD-10-CM | POA: Diagnosis present

## 2015-07-18 DIAGNOSIS — Z515 Encounter for palliative care: Secondary | ICD-10-CM | POA: Diagnosis present

## 2015-07-18 DIAGNOSIS — R109 Unspecified abdominal pain: Secondary | ICD-10-CM | POA: Diagnosis present

## 2015-07-18 DIAGNOSIS — I428 Other cardiomyopathies: Secondary | ICD-10-CM | POA: Diagnosis present

## 2015-07-18 DIAGNOSIS — Z9119 Patient's noncompliance with other medical treatment and regimen: Secondary | ICD-10-CM | POA: Diagnosis not present

## 2015-07-18 DIAGNOSIS — J189 Pneumonia, unspecified organism: Secondary | ICD-10-CM | POA: Diagnosis not present

## 2015-07-18 DIAGNOSIS — R1310 Dysphagia, unspecified: Secondary | ICD-10-CM | POA: Diagnosis not present

## 2015-07-18 DIAGNOSIS — B20 Human immunodeficiency virus [HIV] disease: Principal | ICD-10-CM | POA: Diagnosis present

## 2015-07-18 DIAGNOSIS — I950 Idiopathic hypotension: Secondary | ICD-10-CM | POA: Diagnosis not present

## 2015-07-18 DIAGNOSIS — E861 Hypovolemia: Secondary | ICD-10-CM | POA: Diagnosis present

## 2015-07-18 DIAGNOSIS — R6884 Jaw pain: Secondary | ICD-10-CM

## 2015-07-18 DIAGNOSIS — D619 Aplastic anemia, unspecified: Secondary | ICD-10-CM | POA: Diagnosis present

## 2015-07-18 DIAGNOSIS — A319 Mycobacterial infection, unspecified: Secondary | ICD-10-CM | POA: Insufficient documentation

## 2015-07-18 DIAGNOSIS — A310 Pulmonary mycobacterial infection: Secondary | ICD-10-CM

## 2015-07-18 DIAGNOSIS — D6109 Other constitutional aplastic anemia: Secondary | ICD-10-CM | POA: Diagnosis not present

## 2015-07-18 DIAGNOSIS — B370 Candidal stomatitis: Secondary | ICD-10-CM | POA: Diagnosis present

## 2015-07-18 DIAGNOSIS — R634 Abnormal weight loss: Secondary | ICD-10-CM | POA: Diagnosis present

## 2015-07-18 DIAGNOSIS — W19XXXA Unspecified fall, initial encounter: Secondary | ICD-10-CM

## 2015-07-18 DIAGNOSIS — L8990 Pressure ulcer of unspecified site, unspecified stage: Secondary | ICD-10-CM | POA: Insufficient documentation

## 2015-07-18 DIAGNOSIS — I429 Cardiomyopathy, unspecified: Secondary | ICD-10-CM | POA: Diagnosis not present

## 2015-07-18 HISTORY — DX: Adult failure to thrive: R62.7

## 2015-07-18 HISTORY — DX: Iron deficiency anemia, unspecified: D50.9

## 2015-07-18 HISTORY — DX: Disseminated mycobacterium avium-intracellulare complex (DMAC): A31.2

## 2015-07-18 HISTORY — DX: Pneumonia due to other streptococci: J15.4

## 2015-07-18 HISTORY — DX: Human immunodeficiency virus (HIV) disease: B20

## 2015-07-18 HISTORY — DX: Asymptomatic varicose veins of unspecified lower extremity: I83.90

## 2015-07-18 HISTORY — DX: Other pancytopenia: D61.818

## 2015-07-18 LAB — CBC WITH DIFFERENTIAL/PLATELET
BASOS PCT: 0 %
Basophils Absolute: 0 10*3/uL (ref 0.0–0.1)
EOS PCT: 0 %
Eosinophils Absolute: 0 10*3/uL (ref 0.0–0.7)
HCT: 30.8 % — ABNORMAL LOW (ref 39.0–52.0)
HEMOGLOBIN: 9.9 g/dL — AB (ref 13.0–17.0)
Lymphocytes Relative: 9 %
Lymphs Abs: 0.3 10*3/uL — ABNORMAL LOW (ref 0.7–4.0)
MCH: 24.8 pg — ABNORMAL LOW (ref 26.0–34.0)
MCHC: 32.1 g/dL (ref 30.0–36.0)
MCV: 77.2 fL — ABNORMAL LOW (ref 78.0–100.0)
MONOS PCT: 5 %
Monocytes Absolute: 0.2 10*3/uL (ref 0.1–1.0)
NEUTROS PCT: 86 %
Neutro Abs: 3.1 10*3/uL (ref 1.7–7.7)
Platelets: 111 10*3/uL — ABNORMAL LOW (ref 150–400)
RBC: 3.99 MIL/uL — AB (ref 4.22–5.81)
RDW: 19.9 % — ABNORMAL HIGH (ref 11.5–15.5)
WBC: 3.6 10*3/uL — AB (ref 4.0–10.5)

## 2015-07-18 LAB — COMPREHENSIVE METABOLIC PANEL
ALK PHOS: 295 U/L — AB (ref 38–126)
ALT: 25 U/L (ref 17–63)
ANION GAP: 8 (ref 5–15)
AST: 61 U/L — AB (ref 15–41)
BUN: 42 mg/dL — AB (ref 6–20)
CALCIUM: 7.5 mg/dL — AB (ref 8.9–10.3)
CO2: 17 mmol/L — AB (ref 22–32)
CREATININE: 1.19 mg/dL (ref 0.61–1.24)
Chloride: 113 mmol/L — ABNORMAL HIGH (ref 101–111)
GFR calc Af Amer: >60 mL/min (ref >60)
GFR calc non Af Amer: >60 mL/min (ref >60)
GLUCOSE: 67 mg/dL (ref 65–99)
Potassium: 3.8 mmol/L (ref 3.5–5.1)
SODIUM: 138 mmol/L (ref 135–145)
Total Bilirubin: 0.6 mg/dL (ref 0.3–1.2)
Total Protein: 3.9 g/dL — ABNORMAL LOW (ref 6.5–8.1)

## 2015-07-18 LAB — URINALYSIS, ROUTINE W REFLEX MICROSCOPIC
Bilirubin Urine: NEGATIVE
Glucose, UA: NEGATIVE mg/dL
Ketones, ur: NEGATIVE mg/dL
Nitrite: NEGATIVE
Protein, ur: 30 mg/dL — AB
SPECIFIC GRAVITY, URINE: 1.019 (ref 1.005–1.030)
pH: 6 (ref 5.0–8.0)

## 2015-07-18 LAB — URINE MICROSCOPIC-ADD ON

## 2015-07-18 LAB — CBG MONITORING, ED
GLUCOSE-CAPILLARY: 137 mg/dL — AB (ref 65–99)
Glucose-Capillary: 43 mg/dL — CL (ref 65–99)

## 2015-07-18 LAB — I-STAT CG4 LACTIC ACID, ED: Lactic Acid, Venous: 1.52 mmol/L (ref 0.5–2.0)

## 2015-07-18 LAB — LIPASE, BLOOD: Lipase: 30 U/L (ref 11–51)

## 2015-07-18 MED ORDER — DEXTROSE 50 % IV SOLN
INTRAVENOUS | Status: AC
  Administered 2015-07-18: 50 mL
  Filled 2015-07-18: qty 50

## 2015-07-18 MED ORDER — RIFABUTIN 150 MG PO CAPS
300.0000 mg | ORAL_CAPSULE | ORAL | Status: DC
  Administered 2015-07-18 – 2015-07-20 (×3): 300 mg via ORAL
  Filled 2015-07-18 (×4): qty 2

## 2015-07-18 MED ORDER — PANTOPRAZOLE SODIUM 40 MG PO TBEC
40.0000 mg | DELAYED_RELEASE_TABLET | Freq: Every day | ORAL | Status: DC
  Administered 2015-07-19 – 2015-07-24 (×5): 40 mg via ORAL
  Filled 2015-07-18 (×7): qty 1

## 2015-07-18 MED ORDER — PIPERACILLIN-TAZOBACTAM 3.375 G IVPB 30 MIN
3.3750 g | Freq: Once | INTRAVENOUS | Status: AC
  Administered 2015-07-18: 3.375 g via INTRAVENOUS
  Filled 2015-07-18: qty 50

## 2015-07-18 MED ORDER — AZITHROMYCIN 600 MG PO TABS
600.0000 mg | ORAL_TABLET | Freq: Every day | ORAL | Status: DC
Start: 2015-07-18 — End: 2015-07-18
  Administered 2015-07-18: 600 mg via ORAL
  Filled 2015-07-18: qty 1

## 2015-07-18 MED ORDER — SODIUM CHLORIDE 0.9 % IJ SOLN
3.0000 mL | Freq: Two times a day (BID) | INTRAMUSCULAR | Status: DC
  Administered 2015-07-18 – 2015-07-29 (×11): 3 mL via INTRAVENOUS
  Administered 2015-07-30: 10 mL via INTRAVENOUS

## 2015-07-18 MED ORDER — OXYCODONE-ACETAMINOPHEN 5-325 MG PO TABS
1.0000 | ORAL_TABLET | Freq: Three times a day (TID) | ORAL | Status: DC | PRN
Start: 2015-07-18 — End: 2015-07-18
  Filled 2015-07-18: qty 1

## 2015-07-18 MED ORDER — FERROUS SULFATE 325 (65 FE) MG PO TABS
325.0000 mg | ORAL_TABLET | Freq: Every day | ORAL | Status: DC
  Administered 2015-07-19 – 2015-07-24 (×6): 325 mg via ORAL
  Filled 2015-07-18 (×8): qty 1

## 2015-07-18 MED ORDER — SODIUM CHLORIDE 0.9 % IV SOLN
INTRAVENOUS | Status: DC
  Administered 2015-07-18 – 2015-07-20 (×4): via INTRAVENOUS

## 2015-07-18 MED ORDER — EMTRICITABINE-TENOFOVIR AF 200-25 MG PO TABS
1.0000 | ORAL_TABLET | Freq: Every day | ORAL | Status: DC
  Administered 2015-07-18 – 2015-07-29 (×12): 1 via ORAL
  Filled 2015-07-18 (×15): qty 1

## 2015-07-18 MED ORDER — DOLUTEGRAVIR SODIUM 50 MG PO TABS
50.0000 mg | ORAL_TABLET | Freq: Every day | ORAL | Status: DC
  Administered 2015-07-18 – 2015-07-29 (×12): 50 mg via ORAL
  Filled 2015-07-18 (×14): qty 1

## 2015-07-18 MED ORDER — PIPERACILLIN-TAZOBACTAM 3.375 G IVPB
3.3750 g | Freq: Three times a day (TID) | INTRAVENOUS | Status: DC
  Administered 2015-07-19 – 2015-07-24 (×17): 3.375 g via INTRAVENOUS
  Filled 2015-07-18 (×20): qty 50

## 2015-07-18 MED ORDER — VANCOMYCIN HCL IN DEXTROSE 750-5 MG/150ML-% IV SOLN
750.0000 mg | Freq: Two times a day (BID) | INTRAVENOUS | Status: DC
  Administered 2015-07-19 – 2015-07-21 (×5): 750 mg via INTRAVENOUS
  Filled 2015-07-18 (×6): qty 150

## 2015-07-18 MED ORDER — CLARITHROMYCIN 500 MG PO TABS
500.0000 mg | ORAL_TABLET | Freq: Two times a day (BID) | ORAL | Status: DC
  Administered 2015-07-18 – 2015-07-20 (×5): 500 mg via ORAL
  Filled 2015-07-18 (×8): qty 1

## 2015-07-18 MED ORDER — SULFAMETHOXAZOLE-TRIMETHOPRIM 800-160 MG PO TABS
1.0000 | ORAL_TABLET | Freq: Every day | ORAL | Status: DC
  Administered 2015-07-18 – 2015-07-23 (×6): 1 via ORAL
  Filled 2015-07-18 (×8): qty 1

## 2015-07-18 MED ORDER — ETHAMBUTOL HCL 400 MG PO TABS
15.0000 mg/kg | ORAL_TABLET | Freq: Every day | ORAL | Status: DC
  Administered 2015-07-18 – 2015-07-29 (×10): 800 mg via ORAL
  Filled 2015-07-18 (×14): qty 2

## 2015-07-18 MED ORDER — GI COCKTAIL ~~LOC~~
30.0000 mL | Freq: Once | ORAL | Status: AC
  Administered 2015-07-18: 30 mL via ORAL
  Filled 2015-07-18: qty 30

## 2015-07-18 MED ORDER — AZITHROMYCIN 600 MG PO TABS
1200.0000 mg | ORAL_TABLET | ORAL | Status: DC
  Filled 2015-07-18: qty 2

## 2015-07-18 MED ORDER — NICOTINE 21 MG/24HR TD PT24
21.0000 mg | MEDICATED_PATCH | Freq: Every day | TRANSDERMAL | Status: DC
  Administered 2015-07-18 – 2015-07-28 (×11): 21 mg via TRANSDERMAL
  Filled 2015-07-18 (×13): qty 1

## 2015-07-18 MED ORDER — VANCOMYCIN HCL IN DEXTROSE 1-5 GM/200ML-% IV SOLN
1000.0000 mg | Freq: Once | INTRAVENOUS | Status: AC
  Administered 2015-07-19: 1000 mg via INTRAVENOUS
  Filled 2015-07-18: qty 200

## 2015-07-18 MED ORDER — ENSURE ENLIVE PO LIQD
237.0000 mL | Freq: Three times a day (TID) | ORAL | Status: DC
  Administered 2015-07-18 – 2015-07-19 (×3): 237 mL via ORAL

## 2015-07-18 MED ORDER — FLUCONAZOLE IN SODIUM CHLORIDE 100-0.9 MG/50ML-% IV SOLN
100.0000 mg | INTRAVENOUS | Status: DC
  Administered 2015-07-18 – 2015-07-20 (×3): 100 mg via INTRAVENOUS
  Filled 2015-07-18 (×6): qty 50

## 2015-07-18 MED ORDER — FOLIC ACID 1 MG PO TABS
1.0000 mg | ORAL_TABLET | Freq: Every day | ORAL | Status: DC
  Administered 2015-07-19 – 2015-07-29 (×10): 1 mg via ORAL
  Filled 2015-07-18 (×13): qty 1

## 2015-07-18 MED ORDER — MEGESTROL ACETATE 400 MG/10ML PO SUSP
400.0000 mg | Freq: Every day | ORAL | Status: DC
  Administered 2015-07-18 – 2015-07-28 (×6): 400 mg via ORAL
  Filled 2015-07-18 (×14): qty 10

## 2015-07-18 MED ORDER — HYDROCODONE-ACETAMINOPHEN 5-325 MG PO TABS
1.0000 | ORAL_TABLET | Freq: Four times a day (QID) | ORAL | Status: DC | PRN
  Administered 2015-07-18 – 2015-07-29 (×17): 1 via ORAL
  Filled 2015-07-18 (×18): qty 1

## 2015-07-18 MED ORDER — SODIUM CHLORIDE 0.9 % IV BOLUS (SEPSIS)
2000.0000 mL | Freq: Once | INTRAVENOUS | Status: AC
  Administered 2015-07-18: 2000 mL via INTRAVENOUS

## 2015-07-18 MED ORDER — SODIUM CHLORIDE 0.9 % IV BOLUS (SEPSIS)
1000.0000 mL | Freq: Once | INTRAVENOUS | Status: AC
  Administered 2015-07-18: 1000 mL via INTRAVENOUS
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
## 2015-07-18 NOTE — Progress Notes (Addendum)
Pt in room resting comfortably place on telemetry. Pt alert and oriented x4. Complains of pain 10/10 in abdomen, md notified. Prescription obtained. Pt hypotensive, MD aware. Pt. Stable. Will continue to monitor.

## 2015-07-18 NOTE — ED Notes (Signed)
Pt refusing rectal temp at this time  
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
## 2015-07-18 NOTE — ED Notes (Addendum)
Per EMS - pt from home. Pt has not been able to eat since Thanksgiving. Intermittent n/v/d. HIV +, dx 2001 w/ no treatment since 2005. No active GI bleed but hx intermittent GI bleeds. Given 1000mg  Tylenol for fever, given 314ml NS. CBG 177. Initial BP 88/50 now 103/61. Mildly tachycardic - hr 104bpm. Pt has lost 30+ lbs in last 1.77mo.

## 2015-07-18 NOTE — ED Notes (Signed)
Pt will not attempt to obtain urine specimen at this time. Pt requesting to use bathroom to have bowel movement. This RN offered to take pt in wheelchair to restroom, assist to bedside commode, or use bedpan. Pt declines all options, stating "I am too weak to roll or lift my hips for a bedpan. I will wait until another time to have a bowel movement." This RN explained staff would be willing to assist pt in rolling or lifting hips and pt declined assistance at this time.

## 2015-07-18 NOTE — Progress Notes (Addendum)
ANTIBIOTIC CONSULT NOTE - INITIAL  Pharmacy Consult for septra, vancomycin/zosyn  Indication: PCP prophylaxis and PNA  No Known Allergies  Patient Measurements:    Weight: 54 kg  Vital Signs: Temp: 98.6 F (37 C) (12/16 1104) Temp Source: Oral (12/16 1104) BP: 87/61 mmHg (12/16 1230) Pulse Rate: 82 (12/16 1230) Intake/Output from previous day:   Intake/Output from this shift:    Labs:  Recent Labs  07/18/15 1110  WBC 3.6*  HGB 9.9*  PLT 111*  CREATININE 1.19   Estimated Creatinine Clearance: 61.1 mL/min (by C-G formula based on Cr of 1.19). No results for input(s): VANCOTROUGH, VANCOPEAK, VANCORANDOM, GENTTROUGH, GENTPEAK, GENTRANDOM, TOBRATROUGH, TOBRAPEAK, TOBRARND, AMIKACINPEAK, AMIKACINTROU, AMIKACIN in the last 72 hours.   Microbiology: Recent Results (from the past 720 hour(s))  MRSA PCR Screening     Status: None   Collection Time: 06/20/15  7:10 PM  Result Value Ref Range Status   MRSA by PCR NEGATIVE NEGATIVE Final    Comment:        The GeneXpert MRSA Assay (FDA approved for NASAL specimens only), is one component of a comprehensive MRSA colonization surveillance program. It is not intended to diagnose MRSA infection nor to guide or monitor treatment for MRSA infections.   Culture, blood (routine x 2) Call MD if unable to obtain prior to antibiotics being given     Status: None   Collection Time: 06/20/15  8:02 PM  Result Value Ref Range Status   Specimen Description BLOOD RIGHT ARM  Final   Special Requests BOTTLES DRAWN AEROBIC AND ANAEROBIC 5CC  Final   Culture NO GROWTH 5 DAYS  Final   Report Status 06/25/2015 FINAL  Final  Culture, blood (routine x 2) Call MD if unable to obtain prior to antibiotics being given     Status: None   Collection Time: 06/20/15  8:16 PM  Result Value Ref Range Status   Specimen Description BLOOD RIGHT ARM  Final   Special Requests BOTTLES DRAWN AEROBIC AND ANAEROBIC 5CC  Final   Culture NO GROWTH 5 DAYS  Final    Report Status 06/25/2015 FINAL  Final  AFB culture, blood     Status: None (Preliminary result)   Collection Time: 06/21/15 10:18 AM  Result Value Ref Range Status   Specimen Description BLOOD RIGHT ANTECUBITAL  Final   Special Requests ACID FAST BACILLI 5CC  Final   Culture   Final    ACID FAST BACILLI PRESENT, NOT MORPHOLOGICALLY SIMILAR TO MYCOBACTERIUM TUBERCULOSIS AFB IDENTIFICATION TO FOLLOW CRITICAL RESULT CALLED TO, READ BACK BY AND VERIFIED WITH: Caryl Bis RN 15:35 07/09/15 ([email or web address])    Report Status PENDING  Incomplete  C difficile quick scan w PCR reflex     Status: None   Collection Time: 06/21/15  4:29 PM  Result Value Ref Range Status   C Diff antigen NEGATIVE NEGATIVE Final   C Diff toxin NEGATIVE NEGATIVE Final   C Diff interpretation Negative for toxigenic C. difficile  Final  Giardia/Cryptosporidium EIA     Status: None   Collection Time: 06/21/15  4:29 PM  Result Value Ref Range Status   Giardia Ag, Stl Negative Negative Final   Cryptosporidium EIA Negative Negative Final    Comment: (NOTE) Performed At: [facility] [address] [place], [place] [id number] Lindon Romp MD [id number]    Source of Sample STOOL  Final    Medical History: Past Medical History  Diagnosis Date  . HIV (human immunodeficiency virus infection) ([place])  Assessment: 44 YO M with HIV dx 2001, no treatment since 2005. Pharmacy consulted to dose septra for PCP prophylaxis and vancomycin and zosyn for PNA. Wt 54 Kg, creat 1.19. WBC 3.6, pltc low at 111 Previous dx of PCP both upper lobes - was in hospital 11/18 - 11/21.  Started on Septra for PCP PNA 11/18 for a planned 21 day course (dc'd home w/ Rx Septra DS 1 tid x 17 days) but did not take meds after discharge.  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   12/16 vanc>> 12/16 zosyn >> 12/16 septra PCP px dose>>  12/16  BCx2..   Goal of Therapy:  Vancomycin trough of 15-20 mcg/ml for PNA  Plan:  - Septra DS 1 daily - could change to Septra DS 1 tab MWF only at time of discharge for pt convenience - zosyn 3.375 gm IV x 1 dose over 30 minutes, then zosyn 3.375 gm IV q8h, infuse each dose over 4 hours - vancomycin 1 gm IV x1 followed by vancomycin 750 mg IV q12h - f/u renal fun, wbc, temp, CXR, culture data - vanc levels as needed Eudelia Bunch, Pharm.D. [id number] 07/18/2015 1:27 PM

## 2015-07-19 ENCOUNTER — Inpatient Hospital Stay (HOSPITAL_COMMUNITY): Payer: 59

## 2015-07-19 DIAGNOSIS — B370 Candidal stomatitis: Secondary | ICD-10-CM | POA: Diagnosis present

## 2015-07-19 DIAGNOSIS — N3000 Acute cystitis without hematuria: Secondary | ICD-10-CM

## 2015-07-19 DIAGNOSIS — E860 Dehydration: Secondary | ICD-10-CM | POA: Insufficient documentation

## 2015-07-19 DIAGNOSIS — N390 Urinary tract infection, site not specified: Secondary | ICD-10-CM | POA: Diagnosis present

## 2015-07-19 DIAGNOSIS — I950 Idiopathic hypotension: Secondary | ICD-10-CM

## 2015-07-19 LAB — GASTROINTESTINAL PANEL BY PCR, STOOL (REPLACES STOOL CULTURE)
ADENOVIRUS F40/41: NOT DETECTED
ASTROVIRUS: NOT DETECTED
CAMPYLOBACTER SPECIES: NOT DETECTED
CRYPTOSPORIDIUM: NOT DETECTED
Cyclospora cayetanensis: NOT DETECTED
E. COLI O157: NOT DETECTED
ENTEROPATHOGENIC E COLI (EPEC): NOT DETECTED
ENTEROTOXIGENIC E COLI (ETEC): NOT DETECTED
Entamoeba histolytica: NOT DETECTED
Enteroaggregative E coli (EAEC): NOT DETECTED
Giardia lamblia: NOT DETECTED
Norovirus GI/GII: NOT DETECTED
PLESIMONAS SHIGELLOIDES: NOT DETECTED
ROTAVIRUS A: NOT DETECTED
SAPOVIRUS (I, II, IV, AND V): NOT DETECTED
SHIGA LIKE TOXIN PRODUCING E COLI (STEC): NOT DETECTED
Salmonella species: NOT DETECTED
Shigella/Enteroinvasive E coli (EIEC): NOT DETECTED
Vibrio cholerae: NOT DETECTED
Vibrio species: NOT DETECTED
YERSINIA ENTEROCOLITICA: NOT DETECTED

## 2015-07-19 LAB — BASIC METABOLIC PANEL
Anion gap: 9 (ref 5–15)
BUN: 41 mg/dL — AB (ref 6–20)
CHLORIDE: 115 mmol/L — AB (ref 101–111)
CO2: 15 mmol/L — AB (ref 22–32)
Calcium: 7 mg/dL — ABNORMAL LOW (ref 8.9–10.3)
Creatinine, Ser: 1.34 mg/dL — ABNORMAL HIGH (ref 0.61–1.24)
GFR calc Af Amer: >60 mL/min (ref >60)
GFR calc non Af Amer: >60 mL/min (ref >60)
Glucose, Bld: 79 mg/dL (ref 65–99)
POTASSIUM: 3.1 mmol/L — AB (ref 3.5–5.1)
SODIUM: 139 mmol/L (ref 135–145)

## 2015-07-19 LAB — CBC
HCT: 30.1 % — ABNORMAL LOW (ref 39.0–52.0)
HEMOGLOBIN: 9.4 g/dL — AB (ref 13.0–17.0)
MCH: 24 pg — AB (ref 26.0–34.0)
MCHC: 31.2 g/dL (ref 30.0–36.0)
MCV: 76.8 fL — AB (ref 78.0–100.0)
PLATELETS: 82 10*3/uL — AB (ref 150–400)
RBC: 3.92 MIL/uL — AB (ref 4.22–5.81)
RDW: 20.1 % — ABNORMAL HIGH (ref 11.5–15.5)
WBC: 2.7 10*3/uL — AB (ref 4.0–10.5)

## 2015-07-19 LAB — C DIFFICILE QUICK SCREEN W PCR REFLEX
C DIFFICILE (CDIFF) INTERP: NEGATIVE
C DIFFICILE (CDIFF) TOXIN: NEGATIVE
C Diff antigen: NEGATIVE

## 2015-07-19 LAB — MAGNESIUM: MAGNESIUM: 1.6 mg/dL — AB (ref 1.7–2.4)

## 2015-07-19 MED ORDER — ALBUMIN HUMAN 25 % IV SOLN
75.0000 g | Freq: Once | INTRAVENOUS | Status: AC
  Administered 2015-07-19: 75 g via INTRAVENOUS
  Filled 2015-07-19: qty 300

## 2015-07-19 MED ORDER — BOOST / RESOURCE BREEZE PO LIQD
1.0000 | Freq: Three times a day (TID) | ORAL | Status: DC
  Administered 2015-07-19 – 2015-07-30 (×6): 1 via ORAL

## 2015-07-19 MED ORDER — ENSURE ENLIVE PO LIQD
237.0000 mL | Freq: Two times a day (BID) | ORAL | Status: DC
  Administered 2015-07-24 – 2015-07-28 (×2): 237 mL via ORAL

## 2015-07-19 MED ORDER — SODIUM CHLORIDE 0.9 % IV BOLUS (SEPSIS)
500.0000 mL | Freq: Once | INTRAVENOUS | Status: AC
  Administered 2015-07-19: 500 mL via INTRAVENOUS

## 2015-07-19 MED ORDER — CETYLPYRIDINIUM CHLORIDE 0.05 % MT LIQD
7.0000 mL | Freq: Two times a day (BID) | OROMUCOSAL | Status: DC
  Administered 2015-07-19 – 2015-07-30 (×15): 7 mL via OROMUCOSAL

## 2015-07-19 MED ORDER — SODIUM CHLORIDE 0.9 % IV BOLUS (SEPSIS)
1000.0000 mL | Freq: Once | INTRAVENOUS | Status: AC
  Administered 2015-07-19: 1000 mL via INTRAVENOUS

## 2015-07-19 MED ORDER — MAGNESIUM SULFATE 4 GM/100ML IV SOLN
4.0000 g | Freq: Once | INTRAVENOUS | Status: AC
  Administered 2015-07-19: 4 g via INTRAVENOUS
  Filled 2015-07-19 (×2): qty 100

## 2015-07-19 MED ORDER — LORAZEPAM 0.5 MG PO TABS
0.5000 mg | ORAL_TABLET | Freq: Once | ORAL | Status: AC
  Administered 2015-07-19: 0.5 mg via ORAL
  Filled 2015-07-19: qty 1

## 2015-07-19 NOTE — Progress Notes (Signed)
Initial Nutrition Assessment  DOCUMENTATION CODES:   Severe malnutrition in context of chronic illness  INTERVENTION:   Boost Breeze po TID with meals, each supplement provides 250 kcal and 9 grams of protein Ensure Enlive po BID between meals, each supplement provides 350 kcal and 20 grams of protein  NUTRITION DIAGNOSIS:   Malnutrition related to chronic illness as evidenced by severe depletion of body fat, moderate depletions of muscle mass, 33 percent weight loss x 3 months.  GOAL:   Patient will meet greater than or equal to 90% of their needs  MONITOR:   PO intake, Supplement acceptance, Diet advancement, I & O's, Labs, Weight trends  REASON FOR ASSESSMENT:   Malnutrition Screening Tool   ASSESSMENT:   Miguel Campos is a 44 y.o. male PMH of HIV/AIDS, non adherent to treatment, recently admitted for PCP pneumonia (06/2014) discharged on HAART, TPM-SMX, Azithromycin (but did not take meds after discharge) presented with nausea, recurrent diarrhea, progressive generalized weakness, tiredness. He also reports fevers, chills, DOE, cough, SOB, but denies chest pains. Patient is not able to walk well, recurring assistance from his family   Medications reviewed and include: folvite, diflucan, megace Labs reviewed: potassium low (3.1), magnesium low (1.6)  Pt sleeping. Mom at bedside and provides hx. Per mom pt has been non-compliant with meds. He has been having diarrhea, severe abdominal pain and hiccups for 3 months during which time he lost from 160 lb to now 108 lb. He has been unable to eat more than a bite or two at a time. He has no appetite. Mom feels its probably too late for her son now.  Deferred exam as pt was now sleeping however visual inspection of temples and orbital region was able to note severe depletion.  Per mom pt is lactose intolerant and has selected foods appropriate for him for lunch on his full liquid diet. MD has started Megace, first dose 12/16.   Diet  Order:  Diet full liquid Room service appropriate?: Yes; Fluid consistency:: Thin  Skin:  Reviewed, no issues  Last BM:  12/16 - diarrhea  Height:   Ht Readings from Last 1 Encounters:  07/08/15 5\' 7"  (1.702 m)   Weight:   Wt Readings from Last 1 Encounters:  07/18/15 108 lb 11 oz (49.3 kg)   Ideal Body Weight:  67.2 kg  BMI:  Body mass index is 17.02 kg/(m^2).  Estimated Nutritional Needs:   Kcal:  1700-1900  Protein:  85-100 grams  Fluid:  > 1.6 L/day  EDUCATION NEEDS:   No education needs identified at this time  [place], [place], [place] Pager [phone] After Hours Pager

## 2015-07-19 NOTE — Progress Notes (Signed)
Patient requested for labs to be drawn later on this morning. On call NP notified and approved of request.  Ermalinda Memos, RN

## 2015-07-19 NOTE — Progress Notes (Signed)
[facility] PROGRESS NOTE  Naif Hobgood [id number] DOB: 02-17-1971 DOA: 07/18/2015 PCP: Isaias Cowman, PA-C  Assessment/Plan: #1 healthcare associated pneumonia Per chest x-ray and clinical symptoms. Patient states some clinical improvement. Patient also with hypotension. Patient has been pancultured results pending. Patient is afebrile. Patient with a leukopenia with white count of 2.7. Continue empiric Vancomycin and IV Zosyn. ID following.  #2 hypotension Likely secondary to hypovolemia and infectious etiology. Patient mentating well. Systolic blood pressures in the mid 80s. IV fluids. Will give a fluid bolus. Also give patient IV albumin. Follow.  #3 oral thrush Continue IV Diflucan.  #4 MAI Continue azithromycin, rifampin, ethambutol per ID recommendations.  #5 AIDS Patient's CD4 count is 4. Viral load is 71700 from 06/20/2015. Genotype pending. Continue ART therapy per ID. ID following and appreciate input and recommendations.  #6 severe protein calorie malnutrition Patient has been started on Megace. Nutritional supplementation.  #7 diarrhea Stool studies pending. Check a C. difficile PCR. Continue empiric IV antibiotics for now. ID following.  #8 history of CHF/cardiomyopathy Prior 2-D echo with EF of 30-35%. Patient dehydrated and hypovolemic. Continue IV fluids. Beta blocker on hold secondary to hypotension. Follow.  #9 UTI Urine cultures pending. Continue IV Zosyn.  #10 prognosis Patient with a poor prognosis. Patient with AIDS and has been noncompliant with his medications. Patient presented for the second time to this hospital over the past 2 months and has been admitted 6 times over the past 2 months. Patient with failure to thrive. Patient is cachectic and deteriorating. Will see how patient does in the next 24-48 hours and if no significant improvement may consider palliative care for goals of care.    Code Status: Full Family Communication: Updated  patient and family at bedside. Disposition Plan: Home with home health versus skilled nursing facility when medically stable.   Consultants:  Infectious diseases: Dr. Johnnye Sima 07/18/2015  Procedures:  Chest x-ray 07/18/2015  Abdominal films 07/19/2015  Antibiotics:  Oral clarithromycin 07/18/2015  Ethambutol 07/18/2015  IV Diflucan 07/18/2015  IV Zosyn 07/18/2015  IV vancomycin 07/18/2015  Oral Bactrim 07/18/2015  Rifabutin 07/18/2015  HPI/Subjective: Patient sleeping, however easily arousable. Patient states feeling better since admission. Patient stated had one loose stool today.  Objective: Filed Vitals:   07/19/15 0512 07/19/15 0951  BP: 80/49 86/55  Pulse: 81 96  Temp: 97.4 F (36.3 C) 98.3 F (36.8 C)  Resp: 17 17    Intake/Output Summary (Last 24 hours) at 07/19/15 1524 Last data filed at 07/19/15 0952  Gross per 24 hour  Intake 1473.33 ml  Output    250 ml  Net 1223.33 ml   Filed Weights   07/18/15 2103  Weight: 49.3 kg (108 lb 11 oz)    Exam:   General:  NAD. Cachetic. Frail  Cardiovascular: Regular rate rhythm  Respiratory: Clear to auscultation bilaterally anterior lung fields.  Abdomen: Soft, nontender, nondistended, positive bowel sounds.  Musculoskeletal: No clubbing or cyanosis. Bilateral pedal edema.  Data Reviewed: Basic Metabolic Panel:  Recent Labs Lab 07/18/15 1110 07/19/15 0945  NA 138 139  K 3.8 3.1*  CL 113* 115*  CO2 17* 15*  GLUCOSE 67 79  BUN 42* 41*  CREATININE 1.19 1.34*  CALCIUM 7.5* 7.0*  MG  --  1.6*   Liver Function Tests:  Recent Labs Lab 07/18/15 1110  AST 61*  ALT 25  ALKPHOS 295*  BILITOT 0.6  PROT 3.9*  ALBUMIN <1.0*    Recent Labs Lab 07/18/15 1110  LIPASE 30  No results for input(s): AMMONIA in the last 168 hours. CBC:  Recent Labs Lab 07/18/15 1110 07/19/15 0945  WBC 3.6* 2.7*  NEUTROABS 3.1  --   HGB 9.9* 9.4*  HCT 30.8* 30.1*  MCV 77.2* 76.8*  PLT 111* 82*    Cardiac Enzymes: No results for input(s): CKTOTAL, CKMB, CKMBINDEX, TROPONINI in the last 168 hours. BNP (last 3 results)  Recent Labs  06/20/15 2002  BNP 243.8*    ProBNP (last 3 results) No results for input(s): PROBNP in the last 8760 hours.  CBG:  Recent Labs Lab 07/18/15 1108 07/18/15 1132  GLUCAP 43* 137*    Recent Results (from the past 240 hour(s))  Culture, blood (Routine X 2) w Reflex to ID Panel     Status: None (Preliminary result)   Collection Time: 07/18/15 11:30 AM  Result Value Ref Range Status   Specimen Description BLOOD RIGHT ANTECUBITAL  Final   Special Requests BOTTLES DRAWN AEROBIC ONLY 5CC  Final   Culture NO GROWTH < 24 HOURS  Final   Report Status PENDING  Incomplete  Culture, blood (Routine X 2) w Reflex to ID Panel     Status: None (Preliminary result)   Collection Time: 07/18/15 11:45 AM  Result Value Ref Range Status   Specimen Description BLOOD LEFT ANTECUBITAL  Final   Special Requests BOTTLES DRAWN AEROBIC ONLY 5CC  Final   Culture NO GROWTH < 24 HOURS  Final   Report Status PENDING  Incomplete  AFB culture with smear     Status: None (Preliminary result)   Collection Time: 07/18/15  3:44 PM  Result Value Ref Range Status   Specimen Description STOOL  Final   Special Requests Immunocompromised  Final   Acid Fast Smear   Final    7183 3+ ACID FAST BACILLI SEEN CRITICAL RESULT CALLED TO, READ BACK BY AND VERIFIED WITH: ALBERT 14:45 08/18/14 [place] FAXED TO 832 Performed at [facility]    Culture   Final    CULTURE WILL BE EXAMINED FOR 6 WEEKS BEFORE ISSUING A FINAL REPORT Performed at [facility]    Report Status PENDING  Incomplete  C difficile quick scan w PCR reflex     Status: None   Collection Time: 07/19/15  1:43 PM  Result Value Ref Range Status   C Diff antigen NEGATIVE NEGATIVE Final   C Diff toxin NEGATIVE NEGATIVE Final   C Diff interpretation Negative for toxigenic C. difficile  Final      Studies: Dg Chest Port 1 View  07/18/2015  CLINICAL DATA:  Weakness and fever. HIV/AIDS with most recent CD4 count of less than 10 prior microL 4 weeks ago. EXAM: PORTABLE CHEST 1 VIEW COMPARISON:  06/21/2015 FINDINGS: Retrocardiac airspace opacity on the left, chronic. The right lung appears grossly clear. Cardiac and mediastinal margins appear normal. No pleural effusion identified. IMPRESSION: 1. Chronic airspace opacity in the left lower lobe, potentially from pneumonia or atelectasis. Electronically Signed   By: Van Clines M.D.   On: 07/18/2015 11:59   Dg Abd Portable 1v  07/19/2015  CLINICAL DATA:  Abdominal pain, HIV, smoker EXAM: PORTABLE ABDOMEN - 1 VIEW COMPARISON:  Correlation CT abdomen and pelvis 05/27/2015 FINDINGS: Multiple air-filled loops of small bowel in the upper abdomen likely jejunal, largest mildly dilated and showing minimal fold thickening. Findings are suggestive of the presence of enteritis. Gas is seen within the ascending and transverse colon. Paucity of gas within the descending colon and sigmoid colon.  Gas at inferior pelvis likely question within urinary bladder or rectum ; has patient undergone bladder catheterization? No urinary tract calcification or acute osseous findings. IMPRESSION: Mildly prominent small bowel loops in the LEFT upper quadrant and mid abdomen, likely jejuna, some which demonstrate mucosal fold thickening suggesting an enteritis, which can occur from numerous organisms in the setting of HIV/AIDS. Electronically Signed   By: Lavonia Dana M.D.   On: 07/19/2015 12:16    Scheduled Meds: . antiseptic oral rinse  7 mL Mouth Rinse BID  . clarithromycin  500 mg Oral Q12H  . dolutegravir  50 mg Oral Daily  . emtricitabine-tenofovir AF  1 tablet Oral Daily  . ethambutol  15 mg/kg Oral Daily  . feeding supplement  1 Container Oral TID WC  . feeding supplement (ENSURE ENLIVE)  237 mL Oral BID BM  . ferrous sulfate  325 mg Oral Daily  .  fluconazole (DIFLUCAN) IV  100 mg Intravenous Q24H  . folic acid  1 mg Oral Daily  . megestrol  400 mg Oral Daily  . nicotine  21 mg Transdermal Daily  . pantoprazole  40 mg Oral Daily  . piperacillin-tazobactam (ZOSYN)  IV  3.375 g Intravenous Q8H  . rifabutin  300 mg Oral Q24H  . sodium chloride  3 mL Intravenous Q12H  . sulfamethoxazole-trimethoprim  1 tablet Oral Daily  . vancomycin  750 mg Intravenous Q12H   Continuous Infusions: . sodium chloride 100 mL/hr at 07/19/15 [id number]    Principal Problem:   HCAP (healthcare-associated pneumonia) Active Problems:   AIDS ([place])   Tobacco abuse disorder   Arterial hypotension   Anemia due to bone marrow failure (HCC)   Abdominal pain, chronic, epigastric   HIV disease (HCC)   Chronic combined systolic and diastolic congestive heart failure (HCC)   Protein-calorie malnutrition ([place])   Cardiomyopathy ([place])   Weight loss   Diarrhea   UTI (urinary tract infection)   Oral thrush    Time spent: 35 mins    [facility] MD [facility] Pager [phone]. If 7PM-7AM, please contact night-coverage at [email or web address], password [facility] 07/19/2015, 3:24 PM  LOS: 1 day

## 2015-07-20 DIAGNOSIS — I951 Orthostatic hypotension: Secondary | ICD-10-CM | POA: Insufficient documentation

## 2015-07-20 DIAGNOSIS — D61818 Other pancytopenia: Secondary | ICD-10-CM

## 2015-07-20 LAB — CBC WITH DIFFERENTIAL/PLATELET
BASOS ABS: 0 10*3/uL (ref 0.0–0.1)
Basophils Relative: 0 %
EOS PCT: 0 %
Eosinophils Absolute: 0 10*3/uL (ref 0.0–0.7)
HEMATOCRIT: 22.6 % — AB (ref 39.0–52.0)
HEMOGLOBIN: 7.3 g/dL — AB (ref 13.0–17.0)
LYMPHS ABS: 0.2 10*3/uL — AB (ref 0.7–4.0)
LYMPHS PCT: 6 %
MCH: 24.2 pg — ABNORMAL LOW (ref 26.0–34.0)
MCHC: 32.3 g/dL (ref 30.0–36.0)
MCV: 74.8 fL — ABNORMAL LOW (ref 78.0–100.0)
MONOS PCT: 3 %
Monocytes Absolute: 0.1 10*3/uL (ref 0.1–1.0)
NEUTROS PCT: 91 %
Neutro Abs: 2.6 10*3/uL (ref 1.7–7.7)
Platelets: 78 10*3/uL — ABNORMAL LOW (ref 150–400)
RBC: 3.02 MIL/uL — AB (ref 4.22–5.81)
RDW: 19.8 % — AB (ref 11.5–15.5)
WBC Morphology: INCREASED
WBC: 2.9 10*3/uL — AB (ref 4.0–10.5)

## 2015-07-20 LAB — HEMOGLOBIN AND HEMATOCRIT, BLOOD
HEMATOCRIT: 27.5 % — AB (ref 39.0–52.0)
Hemoglobin: 9.1 g/dL — ABNORMAL LOW (ref 13.0–17.0)

## 2015-07-20 LAB — MAGNESIUM: MAGNESIUM: 2.5 mg/dL — AB (ref 1.7–2.4)

## 2015-07-20 LAB — BASIC METABOLIC PANEL
ANION GAP: 7 (ref 5–15)
BUN: 28 mg/dL — ABNORMAL HIGH (ref 6–20)
CHLORIDE: 120 mmol/L — AB (ref 101–111)
CO2: 13 mmol/L — ABNORMAL LOW (ref 22–32)
Calcium: 7.3 mg/dL — ABNORMAL LOW (ref 8.9–10.3)
Creatinine, Ser: 1.4 mg/dL — ABNORMAL HIGH (ref 0.61–1.24)
Glucose, Bld: 62 mg/dL — ABNORMAL LOW (ref 65–99)
POTASSIUM: 2.8 mmol/L — AB (ref 3.5–5.1)
SODIUM: 140 mmol/L (ref 135–145)

## 2015-07-20 LAB — PREPARE RBC (CROSSMATCH)

## 2015-07-20 LAB — OCCULT BLOOD X 1 CARD TO LAB, STOOL: FECAL OCCULT BLD: POSITIVE — AB

## 2015-07-20 MED ORDER — SODIUM CHLORIDE 0.9 % IV SOLN
Freq: Once | INTRAVENOUS | Status: AC
  Administered 2015-07-20: 14:00:00 via INTRAVENOUS

## 2015-07-20 MED ORDER — STERILE WATER FOR INJECTION IV SOLN
INTRAVENOUS | Status: DC
  Administered 2015-07-20 – 2015-07-25 (×7): via INTRAVENOUS
  Filled 2015-07-20 (×14): qty 850

## 2015-07-20 MED ORDER — FAMOTIDINE IN NACL 20-0.9 MG/50ML-% IV SOLN
20.0000 mg | INTRAVENOUS | Status: DC
  Administered 2015-07-20 – 2015-07-22 (×2): 20 mg via INTRAVENOUS
  Filled 2015-07-20 (×5): qty 50

## 2015-07-20 MED ORDER — ACETAMINOPHEN 325 MG PO TABS
650.0000 mg | ORAL_TABLET | Freq: Once | ORAL | Status: AC
Start: 2015-07-20 — End: 2015-07-20
  Administered 2015-07-20: 650 mg via ORAL
  Filled 2015-07-20: qty 2

## 2015-07-20 MED ORDER — SODIUM CHLORIDE 0.9 % IV SOLN
INTRAVENOUS | Status: DC
  Administered 2015-07-20: 11:00:00 via INTRAVENOUS
  Filled 2015-07-20 (×2): qty 1000

## 2015-07-20 MED ORDER — POTASSIUM CHLORIDE CRYS ER 20 MEQ PO TBCR
40.0000 meq | EXTENDED_RELEASE_TABLET | ORAL | Status: AC
  Administered 2015-07-20 (×3): 40 meq via ORAL
  Filled 2015-07-20 (×3): qty 2

## 2015-07-20 MED ORDER — SODIUM CHLORIDE 0.9 % IV BOLUS (SEPSIS)
1000.0000 mL | Freq: Once | INTRAVENOUS | Status: AC
  Administered 2015-07-20: 1000 mL via INTRAVENOUS

## 2015-07-20 MED ORDER — DIPHENHYDRAMINE HCL 25 MG PO CAPS
25.0000 mg | ORAL_CAPSULE | Freq: Once | ORAL | Status: AC
  Administered 2015-07-20: 25 mg via ORAL
  Filled 2015-07-20: qty 1

## 2015-07-20 NOTE — Progress Notes (Signed)
[facility] PROGRESS NOTE  Miguel Campos [id number] DOB: Mar 12, 1971 DOA: 07/18/2015 PCP: Isaias Cowman, PA-C  Assessment/Plan: #1 healthcare associated pneumonia Per chest x-ray and clinical symptoms. Patient states some clinical improvement. Patient also with hypotension. Patient has been pancultured results pending. Patient is afebrile. Patient with a leukopenia with white count of 2.9. Continue empiric Vancomycin and IV Zosyn. ID following.  #2 hypotension Likely secondary to hypovolemia and infectious etiology. Patient mentating well. Systolic blood pressures in the mid-high 80s. IV fluids. Will give a fluid bolus. Also given patient IV albumin. Patient noted to have a hemoglobin of 7.3 this morning. Will transfuse 2 units of packed red blood cells. Follow H&H. Follow.  #3 oral thrush Continue IV Diflucan.  #4 MAI Continue azithromycin, rifampin, ethambutol per ID recommendations.  #5 AIDS Patient's CD4 less than 10 on 06/20/2015. Viral load is 71700 from 06/20/2015. Genotype pending. Continue ART therapy per ID. ID following and appreciate input and recommendations.  #6 severe protein calorie malnutrition Patient has been started on Megace. Nutritional supplementation.  #7 diarrhea Improved. Stool studies negative to date. GI panel negative. C. difficile PCR negative. Stool ova and parasites pending. AFB culture with smear on stool positive. Patient on azithromycin, rifampin, ethambutol for MAI per ID. ID following.  #8 history of CHF/cardiomyopathy Prior 2-D echo with EF of 30-35%. Patient dehydrated and hypovolemic. Continue IV fluids. Beta blocker on hold secondary to hypotension. Follow.  #9 pancytopenia Likely secondary to AIDS. Patient with a white count of 2.9, hemoglobin of 7.3, platelet count of 78. Patient denies any overt bleeding. Continue empiric IV antibiotics. Will transfuse 2 units of packed red blood cells secondary to drop his hemoglobin and his  hypotension. Will also give some IV iron. Follow.  #10 UTI Urine cultures pending. Continue IV Zosyn.  #11 prognosis Patient with a poor prognosis. Patient with AIDS and has been noncompliant with his medications. Patient presented for the second time to this hospital over the past 2 months and has been admitted 6 times over the past 2 months. Patient with failure to thrive. Patient is cachectic and deteriorating. Will see how patient does in the next 24-48 hours and if no significant improvement may consider palliative care for goals of care.    Code Status: Full Family Communication: Updated patient and family at bedside. Disposition Plan: Home with home health versus skilled nursing facility when medically stable.   Consultants:  Infectious diseases: Dr. Johnnye Sima 07/18/2015  Procedures:  Chest x-ray 07/18/2015  Abdominal films 07/19/2015  Antibiotics:  Oral clarithromycin 07/18/2015  Ethambutol 07/18/2015  IV Diflucan 07/18/2015  IV Zosyn 07/18/2015  IV vancomycin 07/18/2015  Oral Bactrim 07/18/2015  Rifabutin 07/18/2015  HPI/Subjective: Patient more alert and more constant today seems more interactive. Patient states he is feeling much better. Denies any shortness of breath. Denies any chest pain. Denies any overt bleeding. Patient states after starting Megace has not had any further diarrhea. No bowel movements today.  Objective: Filed Vitals:   07/20/15 0940 07/20/15 1043  BP: 76/42 88/52  Pulse: 89   Temp:    Resp:     No intake or output data in the 24 hours ending 07/20/15 1116 Filed Weights   07/18/15 2103 07/19/15 2147  Weight: 49.3 kg (108 lb 11 oz) 49 kg (108 lb 0.4 oz)    Exam:   General:  NAD. Cachetic. Frail  Cardiovascular: Regular rate rhythm  Respiratory: Clear to auscultation bilaterally anterior lung fields.  Abdomen: Soft, nontender, nondistended, positive bowel sounds.  Musculoskeletal: No clubbing or cyanosis. Bilateral pedal  edema.  Data Reviewed: Basic Metabolic Panel:  Recent Labs Lab 07/18/15 1110 07/19/15 0945 07/20/15 0439  NA 138 139 140  K 3.8 3.1* 2.8*  CL 113* 115* 120*  CO2 17* 15* 13*  GLUCOSE 67 79 62*  BUN 42* 41* 28*  CREATININE 1.19 1.34* 1.40*  CALCIUM 7.5* 7.0* 7.3*  MG  --  1.6* 2.5*   Liver Function Tests:  Recent Labs Lab 07/18/15 1110  AST 61*  ALT 25  ALKPHOS 295*  BILITOT 0.6  PROT 3.9*  ALBUMIN <1.0*    Recent Labs Lab 07/18/15 1110  LIPASE 30   No results for input(s): AMMONIA in the last 168 hours. CBC:  Recent Labs Lab 07/18/15 1110 07/19/15 0945 07/20/15 0439  WBC 3.6* 2.7* 2.9*  NEUTROABS 3.1  --  2.6  HGB 9.9* 9.4* 7.3*  HCT 30.8* 30.1* 22.6*  MCV 77.2* 76.8* 74.8*  PLT 111* 82* 78*   Cardiac Enzymes: No results for input(s): CKTOTAL, CKMB, CKMBINDEX, TROPONINI in the last 168 hours. BNP (last 3 results)  Recent Labs  06/20/15 2002  BNP 243.8*    ProBNP (last 3 results) No results for input(s): PROBNP in the last 8760 hours.  CBG:  Recent Labs Lab 07/18/15 1108 07/18/15 1132  GLUCAP 43* 137*    Recent Results (from the past 240 hour(s))  Culture, blood (Routine X 2) w Reflex to ID Panel     Status: None (Preliminary result)   Collection Time: 07/18/15 11:30 AM  Result Value Ref Range Status   Specimen Description BLOOD RIGHT ANTECUBITAL  Final   Special Requests BOTTLES DRAWN AEROBIC ONLY 5CC  Final   Culture NO GROWTH 2 DAYS  Final   Report Status PENDING  Incomplete  Culture, blood (Routine X 2) w Reflex to ID Panel     Status: None (Preliminary result)   Collection Time: 07/18/15 11:45 AM  Result Value Ref Range Status   Specimen Description BLOOD LEFT ANTECUBITAL  Final   Special Requests BOTTLES DRAWN AEROBIC ONLY 5CC  Final   Culture NO GROWTH 2 DAYS  Final   Report Status PENDING  Incomplete  Gastrointestinal Panel by PCR , Stool     Status: None   Collection Time: 07/18/15  3:44 PM  Result Value Ref Range  Status   Campylobacter species NOT DETECTED NOT DETECTED Final   Plesimonas shigelloides NOT DETECTED NOT DETECTED Final   Salmonella species NOT DETECTED NOT DETECTED Final   Yersinia enterocolitica NOT DETECTED NOT DETECTED Final   Vibrio species NOT DETECTED NOT DETECTED Final   Vibrio cholerae NOT DETECTED NOT DETECTED Final   Enteroaggregative E coli (EAEC) NOT DETECTED NOT DETECTED Final   Enteropathogenic E coli (EPEC) NOT DETECTED NOT DETECTED Final   Enterotoxigenic E coli (ETEC) NOT DETECTED NOT DETECTED Final   Shiga like toxin producing E coli (STEC) NOT DETECTED NOT DETECTED Final   E. coli O157 NOT DETECTED NOT DETECTED Final   Shigella/Enteroinvasive E coli (EIEC) NOT DETECTED NOT DETECTED Final   Cryptosporidium NOT DETECTED NOT DETECTED Final   Cyclospora cayetanensis NOT DETECTED NOT DETECTED Final   Entamoeba histolytica NOT DETECTED NOT DETECTED Final   Giardia lamblia NOT DETECTED NOT DETECTED Final   Adenovirus F40/41 NOT DETECTED NOT DETECTED Final   Astrovirus NOT DETECTED NOT DETECTED Final   Norovirus GI/GII NOT DETECTED NOT DETECTED Final   Rotavirus A NOT DETECTED NOT DETECTED Final   Sapovirus (I, II, IV,  and V) NOT DETECTED NOT DETECTED Final  AFB culture with smear     Status: None (Preliminary result)   Collection Time: 07/18/15  3:44 PM  Result Value Ref Range Status   Specimen Description STOOL  Final   Special Requests Immunocompromised  Final   Acid Fast Smear   Final    7183 3+ ACID FAST BACILLI SEEN CRITICAL RESULT CALLED TO, READ BACK BY AND VERIFIED WITH: ALBERT 14:45 08/18/14 [place] FAXED TO 832 Performed at [facility]    Culture   Final    CULTURE WILL BE EXAMINED FOR 6 WEEKS BEFORE ISSUING A FINAL REPORT Performed at [facility]    Report Status PENDING  Incomplete  C difficile quick scan w PCR reflex     Status: None   Collection Time: 07/19/15  1:43 PM  Result Value Ref Range Status   C Diff antigen NEGATIVE NEGATIVE  Final   C Diff toxin NEGATIVE NEGATIVE Final   C Diff interpretation Negative for toxigenic C. difficile  Final     Studies: Dg Chest Port 1 View  07/18/2015  CLINICAL DATA:  Weakness and fever. HIV/AIDS with most recent CD4 count of less than 10 prior microL 4 weeks ago. EXAM: PORTABLE CHEST 1 VIEW COMPARISON:  06/21/2015 FINDINGS: Retrocardiac airspace opacity on the left, chronic. The right lung appears grossly clear. Cardiac and mediastinal margins appear normal. No pleural effusion identified. IMPRESSION: 1. Chronic airspace opacity in the left lower lobe, potentially from pneumonia or atelectasis. Electronically Signed   By: Van Clines M.D.   On: 07/18/2015 11:59   Dg Abd Portable 1v  07/19/2015  CLINICAL DATA:  Abdominal pain, HIV, smoker EXAM: PORTABLE ABDOMEN - 1 VIEW COMPARISON:  Correlation CT abdomen and pelvis 05/27/2015 FINDINGS: Multiple air-filled loops of small bowel in the upper abdomen likely jejunal, largest mildly dilated and showing minimal fold thickening. Findings are suggestive of the presence of enteritis. Gas is seen within the ascending and transverse colon. Paucity of gas within the descending colon and sigmoid colon. Gas at inferior pelvis likely question within urinary bladder or rectum ; has patient undergone bladder catheterization? No urinary tract calcification or acute osseous findings. IMPRESSION: Mildly prominent small bowel loops in the LEFT upper quadrant and mid abdomen, likely jejuna, some which demonstrate mucosal fold thickening suggesting an enteritis, which can occur from numerous organisms in the setting of HIV/AIDS. Electronically Signed   By: Lavonia Dana M.D.   On: 07/19/2015 12:16    Scheduled Meds: . sodium chloride   Intravenous Once  . acetaminophen  650 mg Oral Once  . antiseptic oral rinse  7 mL Mouth Rinse BID  . clarithromycin  500 mg Oral Q12H  . diphenhydrAMINE  25 mg Oral Once  . dolutegravir  50 mg Oral Daily  .  emtricitabine-tenofovir AF  1 tablet Oral Daily  . ethambutol  15 mg/kg Oral Daily  . famotidine (PEPCID) IV  20 mg Intravenous Q24H  . feeding supplement  1 Container Oral TID WC  . feeding supplement (ENSURE ENLIVE)  237 mL Oral BID BM  . ferrous sulfate  325 mg Oral Daily  . fluconazole (DIFLUCAN) IV  100 mg Intravenous Q24H  . folic acid  1 mg Oral Daily  . megestrol  400 mg Oral Daily  . nicotine  21 mg Transdermal Daily  . pantoprazole  40 mg Oral Daily  . piperacillin-tazobactam (ZOSYN)  IV  3.375 g Intravenous Q8H  . rifabutin  300  mg Oral Q24H  . sodium chloride  3 mL Intravenous Q12H  . sulfamethoxazole-trimethoprim  1 tablet Oral Daily  . vancomycin  750 mg Intravenous Q12H   Continuous Infusions: .  sodium bicarbonate 150 mEq in sterile water 1000 mL infusion      Principal Problem:   HCAP (healthcare-associated pneumonia) Active Problems:   AIDS ([place])   Tobacco abuse disorder   Arterial hypotension   Anemia due to bone marrow failure (HCC)   Abdominal pain, chronic, epigastric   HIV disease (HCC)   Chronic combined systolic and diastolic congestive heart failure (HCC)   Protein-calorie malnutrition ([place])   Cardiomyopathy (HCC)   Weight loss   Diarrhea   UTI (urinary tract infection)   Oral thrush   Dehydration    Time spent: 35 mins    [facility] MD [facility] Pager [phone]. If 7PM-7AM, please contact night-coverage at [email or web address], password [place] 07/20/2015, 11:16 AM  LOS: 2 days

## 2015-07-20 NOTE — Progress Notes (Signed)
Utilization Review Completed.Cliffton Spradley T12/18/2016  

## 2015-07-20 NOTE — Progress Notes (Signed)
CONSULT NOTE - INITIAL  Pharmacy Consult for IV iron Indication: anemia  No Known Allergies  Patient Measurements: Height: 5\' 7"  (170.2 cm) Weight: 108 lb 0.4 oz (49 kg) IBW/kg (Calculated) : 66.1   Vital Signs: Temp: 97.9 F (36.6 C) (12/18 1404) Temp Source: Axillary (12/18 1404) BP: 90/52 mmHg (12/18 1404) Pulse Rate: 84 (12/18 1404) Intake/Output from previous day: 12/17 0701 - 12/18 0700 In: 60 [P.O.:60] Out: -  Intake/Output from this shift:    Labs:  Recent Labs  07/18/15 1110 07/19/15 0945 07/20/15 0439  WBC 3.6* 2.7* 2.9*  HGB 9.9* 9.4* 7.3*  PLT 111* 82* 78*  CREATININE 1.19 1.34* 1.40*   Estimated Creatinine Clearance: 47.2 mL/min (by C-G formula based on Cr of 1.4). No results for input(s): VANCOTROUGH, VANCOPEAK, VANCORANDOM, GENTTROUGH, GENTPEAK, GENTRANDOM, TOBRATROUGH, TOBRAPEAK, TOBRARND, AMIKACINPEAK, AMIKACINTROU, AMIKACIN in the last 72 hours.   Microbiology: Recent Results (from the past 720 hour(s))  MRSA PCR Screening     Status: None   Collection Time: 06/20/15  7:10 PM  Result Value Ref Range Status   MRSA by PCR NEGATIVE NEGATIVE Final    Comment:        The GeneXpert MRSA Assay (FDA approved for NASAL specimens only), is one component of a comprehensive MRSA colonization surveillance program. It is not intended to diagnose MRSA infection nor to guide or monitor treatment for MRSA infections.   Culture, blood (routine x 2) Call MD if unable to obtain prior to antibiotics being given     Status: None   Collection Time: 06/20/15  8:02 PM  Result Value Ref Range Status   Specimen Description BLOOD RIGHT ARM  Final   Special Requests BOTTLES DRAWN AEROBIC AND ANAEROBIC 5CC  Final   Culture NO GROWTH 5 DAYS  Final   Report Status 06/25/2015 FINAL  Final  Culture, blood (routine x 2) Call MD if unable to obtain prior to antibiotics being given     Status: None   Collection Time: 06/20/15  8:16 PM  Result Value Ref Range Status    Specimen Description BLOOD RIGHT ARM  Final   Special Requests BOTTLES DRAWN AEROBIC AND ANAEROBIC 5CC  Final   Culture NO GROWTH 5 DAYS  Final   Report Status 06/25/2015 FINAL  Final  AFB culture, blood     Status: None (Preliminary result)   Collection Time: 06/21/15 10:18 AM  Result Value Ref Range Status   Specimen Description BLOOD RIGHT ANTECUBITAL  Final   Special Requests ACID FAST BACILLI 5CC  Final   Culture   Final    ACID FAST BACILLI PRESENT, NOT MORPHOLOGICALLY SIMILAR TO MYCOBACTERIUM TUBERCULOSIS AFB IDENTIFICATION TO FOLLOW CRITICAL RESULT CALLED TO, READ BACK BY AND VERIFIED WITH: Caryl Bis RN 15:35 07/09/15 ([email or web address])    Report Status PENDING  Incomplete  C difficile quick scan w PCR reflex     Status: None   Collection Time: 06/21/15  4:29 PM  Result Value Ref Range Status   C Diff antigen NEGATIVE NEGATIVE Final   C Diff toxin NEGATIVE NEGATIVE Final   C Diff interpretation Negative for toxigenic C. difficile  Final  Giardia/Cryptosporidium EIA     Status: None   Collection Time: 06/21/15  4:29 PM  Result Value Ref Range Status   Giardia Ag, Stl Negative Negative Final   Cryptosporidium EIA Negative Negative Final    Comment: (NOTE) Performed At: [facility] [place], [place] [id number] Lindon Romp MD [id number]  Source of Sample STOOL  Final  Culture, blood (Routine X 2) w Reflex to ID Panel     Status: None (Preliminary result)   Collection Time: 07/18/15 11:30 AM  Result Value Ref Range Status   Specimen Description BLOOD RIGHT ANTECUBITAL  Final   Special Requests BOTTLES DRAWN AEROBIC ONLY 5CC  Final   Culture NO GROWTH 2 DAYS  Final   Report Status PENDING  Incomplete  Culture, blood (Routine X 2) w Reflex to ID Panel     Status: None (Preliminary result)   Collection Time: 07/18/15 11:45 AM  Result Value Ref Range Status   Specimen Description BLOOD LEFT ANTECUBITAL  Final   Special Requests BOTTLES DRAWN AEROBIC  ONLY 5CC  Final   Culture NO GROWTH 2 DAYS  Final   Report Status PENDING  Incomplete  Gastrointestinal Panel by PCR , Stool     Status: None   Collection Time: 07/18/15  3:44 PM  Result Value Ref Range Status   Campylobacter species NOT DETECTED NOT DETECTED Final   Plesimonas shigelloides NOT DETECTED NOT DETECTED Final   Salmonella species NOT DETECTED NOT DETECTED Final   Yersinia enterocolitica NOT DETECTED NOT DETECTED Final   Vibrio species NOT DETECTED NOT DETECTED Final   Vibrio cholerae NOT DETECTED NOT DETECTED Final   Enteroaggregative E coli (EAEC) NOT DETECTED NOT DETECTED Final   Enteropathogenic E coli (EPEC) NOT DETECTED NOT DETECTED Final   Enterotoxigenic E coli (ETEC) NOT DETECTED NOT DETECTED Final   Shiga like toxin producing E coli (STEC) NOT DETECTED NOT DETECTED Final   E. coli O157 NOT DETECTED NOT DETECTED Final   Shigella/Enteroinvasive E coli (EIEC) NOT DETECTED NOT DETECTED Final   Cryptosporidium NOT DETECTED NOT DETECTED Final   Cyclospora cayetanensis NOT DETECTED NOT DETECTED Final   Entamoeba histolytica NOT DETECTED NOT DETECTED Final   Giardia lamblia NOT DETECTED NOT DETECTED Final   Adenovirus F40/41 NOT DETECTED NOT DETECTED Final   Astrovirus NOT DETECTED NOT DETECTED Final   Norovirus GI/GII NOT DETECTED NOT DETECTED Final   Rotavirus A NOT DETECTED NOT DETECTED Final   Sapovirus (I, II, IV, and V) NOT DETECTED NOT DETECTED Final  AFB culture with smear     Status: None (Preliminary result)   Collection Time: 07/18/15  3:44 PM  Result Value Ref Range Status   Specimen Description STOOL  Final   Special Requests Immunocompromised  Final   Acid Fast Smear   Final    7183 3+ ACID FAST BACILLI SEEN CRITICAL RESULT CALLED TO, READ BACK BY AND VERIFIED WITH: ALBERT 14:45 08/18/14 [place] FAXED TO 832 Performed at [facility]    Culture   Final    CULTURE WILL BE EXAMINED FOR 6 WEEKS BEFORE ISSUING A FINAL REPORT Performed at [facility]    Report Status PENDING  Incomplete  C difficile quick scan w PCR reflex     Status: None   Collection Time: 07/19/15  1:43 PM  Result Value Ref Range Status   C Diff antigen NEGATIVE NEGATIVE Final   C Diff toxin NEGATIVE NEGATIVE Final   C Diff interpretation Negative for toxigenic C. difficile  Final    Medical History: Past Medical History  Diagnosis Date  . HIV (human immunodeficiency virus infection) ([place])     Medications:  Scheduled:  . sodium chloride   Intravenous Once  . antiseptic oral rinse  7 mL Mouth Rinse BID  . clarithromycin  500 mg Oral Q12H  .  dolutegravir  50 mg Oral Daily  . emtricitabine-tenofovir AF  1 tablet Oral Daily  . ethambutol  15 mg/kg Oral Daily  . famotidine (PEPCID) IV  20 mg Intravenous Q24H  . feeding supplement  1 Container Oral TID WC  . feeding supplement (ENSURE ENLIVE)  237 mL Oral BID BM  . ferrous sulfate  325 mg Oral Daily  . fluconazole (DIFLUCAN) IV  100 mg Intravenous Q24H  . folic acid  1 mg Oral Daily  . megestrol  400 mg Oral Daily  . nicotine  21 mg Transdermal Daily  . pantoprazole  40 mg Oral Daily  . piperacillin-tazobactam (ZOSYN)  IV  3.375 g Intravenous Q8H  . rifabutin  300 mg Oral Q24H  . sodium chloride  3 mL Intravenous Q12H  . sulfamethoxazole-trimethoprim  1 tablet Oral Daily  . vancomycin  750 mg Intravenous Q12H    Assessment: 41 you male with CAP, HIV and MAI. He is noted with anemia and pharmacy is consulted for IV iron. Currently treating for PNA and MAI and some data showing possible increased infection risk with IV iron. Discussed with Dr. Grandville Silos and will hold off on IV iron for now.  -Hg= 7.3 (noted 9.4 on 12/17) -08/19/14- Iron= 17, % sat 'not calculated', ferritin 1183.   Goal of Therapy:  Hg ~ 14  Plan:  -Will hold off on IV iron for now -Could consider replacing iron when antibiotic treatment has been competed  Hildred Laser, Pharm D 07/20/2015 2:21 PM

## 2015-07-20 NOTE — Progress Notes (Signed)
Late Entry,overlay mattress ordered for patient last night,pt refused to transfer in bed,stated' I will transfer in the bed Sunday morning"will continue to monitor

## 2015-07-21 ENCOUNTER — Ambulatory Visit: Payer: Self-pay | Admitting: *Deleted

## 2015-07-21 DIAGNOSIS — E46 Unspecified protein-calorie malnutrition: Secondary | ICD-10-CM

## 2015-07-21 DIAGNOSIS — N3001 Acute cystitis with hematuria: Secondary | ICD-10-CM

## 2015-07-21 DIAGNOSIS — B20 Human immunodeficiency virus [HIV] disease: Secondary | ICD-10-CM

## 2015-07-21 DIAGNOSIS — Q821 Xeroderma pigmentosum: Secondary | ICD-10-CM | POA: Insufficient documentation

## 2015-07-21 DIAGNOSIS — I429 Cardiomyopathy, unspecified: Secondary | ICD-10-CM

## 2015-07-21 DIAGNOSIS — R634 Abnormal weight loss: Secondary | ICD-10-CM

## 2015-07-21 DIAGNOSIS — R64 Cachexia: Secondary | ICD-10-CM | POA: Insufficient documentation

## 2015-07-21 LAB — TYPE AND SCREEN
ABO/RH(D): O POS
Antibody Screen: NEGATIVE
UNIT DIVISION: 0
Unit division: 0

## 2015-07-21 MED ORDER — FLUCONAZOLE 100 MG PO TABS
100.0000 mg | ORAL_TABLET | Freq: Every day | ORAL | Status: DC
  Administered 2015-07-21 – 2015-07-24 (×4): 100 mg via ORAL
  Filled 2015-07-21 (×5): qty 1

## 2015-07-21 MED ORDER — AZITHROMYCIN 600 MG PO TABS
600.0000 mg | ORAL_TABLET | Freq: Every day | ORAL | Status: DC
  Administered 2015-07-22 – 2015-07-23 (×2): 600 mg via ORAL
  Filled 2015-07-21 (×5): qty 1

## 2015-07-21 MED ORDER — LOPERAMIDE HCL 2 MG PO CAPS
2.0000 mg | ORAL_CAPSULE | Freq: Once | ORAL | Status: AC
  Administered 2015-07-21: 2 mg via ORAL
  Filled 2015-07-21: qty 1

## 2015-07-21 NOTE — Progress Notes (Addendum)
[place] for Infectious Disease    Subjective: No new complaints   Antibiotics:  Anti-infectives    Start     Dose/Rate Route Frequency Ordered Stop   07/19/15 0400  vancomycin (VANCOCIN) IVPB 750 mg/150 ml premix     750 mg 150 mL/hr over 60 Minutes Intravenous Every 12 hours 07/18/15 1400     07/18/15 2200  piperacillin-tazobactam (ZOSYN) IVPB 3.375 g     3.375 g 12.5 mL/hr over 240 Minutes Intravenous Every 8 hours 07/18/15 1400     07/18/15 2200  clarithromycin (BIAXIN) tablet 500 mg     500 mg Oral Every 12 hours 07/18/15 1735     07/18/15 1830  rifabutin (MYCOBUTIN) capsule 300 mg     300 mg Oral Every 24 hours 07/18/15 1735     07/18/15 1830  fluconazole (DIFLUCAN) IVPB 100 mg     100 mg 50 mL/hr over 60 Minutes Intravenous Every 24 hours 07/18/15 1735     07/18/15 1745  ethambutol (MYAMBUTOL) tablet 800 mg     15 mg/kg  54 kg Oral Daily 07/18/15 1735     07/18/15 1700  dolutegravir (TIVICAY) tablet 50 mg     50 mg Oral Daily 07/18/15 1543     07/18/15 1700  emtricitabine-tenofovir AF (DESCOVY) 200-25 MG per tablet 1 tablet     1 tablet Oral Daily 07/18/15 1543     07/18/15 1700  azithromycin (ZITHROMAX) tablet 1,200 mg  Status:  Discontinued     1,200 mg Oral Weekly 07/18/15 1543 07/18/15 1620   07/18/15 1700  azithromycin (ZITHROMAX) tablet 600 mg  Status:  Discontinued     600 mg Oral Daily 07/18/15 1620 07/18/15 1735   07/18/15 1415  piperacillin-tazobactam (ZOSYN) IVPB 3.375 g     3.375 g 100 mL/hr over 30 Minutes Intravenous  Once 07/18/15 1400 07/18/15 1511   07/18/15 1400  vancomycin (VANCOCIN) IVPB 1000 mg/200 mL premix     1,000 mg 200 mL/hr over 60 Minutes Intravenous  Once 07/18/15 1400 07/19/15 0217   07/18/15 1330  sulfamethoxazole-trimethoprim (BACTRIM DS,SEPTRA DS) 800-160 MG per tablet 1 tablet     1 tablet Oral Daily 07/18/15 1326        Medications: Scheduled Meds: . antiseptic oral rinse  7 mL Mouth Rinse BID  .  clarithromycin  500 mg Oral Q12H  . dolutegravir  50 mg Oral Daily  . emtricitabine-tenofovir AF  1 tablet Oral Daily  . ethambutol  15 mg/kg Oral Daily  . famotidine (PEPCID) IV  20 mg Intravenous Q24H  . feeding supplement  1 Container Oral TID WC  . feeding supplement (ENSURE ENLIVE)  237 mL Oral BID BM  . ferrous sulfate  325 mg Oral Daily  . fluconazole (DIFLUCAN) IV  100 mg Intravenous Q24H  . folic acid  1 mg Oral Daily  . loperamide  2 mg Oral Once  . megestrol  400 mg Oral Daily  . nicotine  21 mg Transdermal Daily  . pantoprazole  40 mg Oral Daily  . piperacillin-tazobactam (ZOSYN)  IV  3.375 g Intravenous Q8H  . rifabutin  300 mg Oral Q24H  . sodium chloride  3 mL Intravenous Q12H  . sulfamethoxazole-trimethoprim  1 tablet Oral Daily  . vancomycin  750 mg Intravenous Q12H   Continuous Infusions: .  sodium bicarbonate 150 mEq in sterile water 1000 mL infusion 75 mL/hr at 07/21/15 0232   PRN Meds:.HYDROcodone-acetaminophen  Objective: Weight change:   Intake/Output Summary (Last 24 hours) at 07/21/15 0952 Last data filed at 07/21/15 0900  Gross per 24 hour  Intake   3375 ml  Output    200 ml  Net   3175 ml   Blood pressure 95/62, pulse 64, temperature 97.7 F (36.5 C), temperature source Oral, resp. rate 16, height 5\' 7"  (1.702 m), weight 108 lb 0.4 oz (49 kg), SpO2 100 %. Temp:  [97.7 F (36.5 C)-99.3 F (37.4 C)] 97.7 F (36.5 C) (12/19 0514) Pulse Rate:  [64-87] 64 (12/19 0514) Resp:  [16-20] 16 (12/19 0514) BP: (83-95)/(50-62) 95/62 mmHg (12/19 0514) SpO2:  [99 %-100 %] 100 % (12/19 0514)  Physical Exam: General: Alert and awake, oriented x3, cachectic HEENT: anicteric sclera,, EOMI, he has purplish lesion on palate ? KS CVS regular rate, normal r,  no murmur rubs or gallops Chest: clear to auscultation bilaterally, no wheezing, rales or rhonchi Abdomen: soft nontender, nondistended, normal bowel sounds, Extremities: no  clubbing or edema noted  bilaterally Skin: ulcerated lesion on shin also ? KS Neuro: nonfocal  CBC:  CBC Latest Ref Rng 07/20/2015 07/20/2015 07/19/2015  WBC 4.0 - 10.5 K/uL - 2.9(L) 2.7(L)  Hemoglobin 13.0 - 17.0 g/dL 9.1(L) 7.3(L) 9.4(L)  Hematocrit 39.0 - 52.0 % 27.5(L) 22.6(L) 30.1(L)  Platelets 150 - 400 K/uL - 78(L) 82(L)      BMET  Recent Labs  07/19/15 0945 07/20/15 0439  NA 139 140  K 3.1* 2.8*  CL 115* 120*  CO2 15* 13*  GLUCOSE 79 62*  BUN 41* 28*  CREATININE 1.34* 1.40*  CALCIUM 7.0* 7.3*     Liver Panel   Recent Labs  07/18/15 1110  PROT 3.9*  ALBUMIN <1.0*  AST 61*  ALT 25  ALKPHOS 295*  BILITOT 0.6       Sedimentation Rate No results for input(s): ESRSEDRATE in the last 72 hours. C-Reactive Protein No results for input(s): CRP in the last 72 hours.  Micro Results: Recent Results (from the past 720 hour(s))  AFB culture, blood     Status: None (Preliminary result)   Collection Time: 06/21/15 10:18 AM  Result Value Ref Range Status   Specimen Description BLOOD RIGHT ANTECUBITAL  Final   Special Requests ACID FAST BACILLI 5CC  Final   Culture   Final    ACID FAST BACILLI PRESENT, NOT MORPHOLOGICALLY SIMILAR TO MYCOBACTERIUM TUBERCULOSIS AFB IDENTIFICATION TO FOLLOW CRITICAL RESULT CALLED TO, READ BACK BY AND VERIFIED WITH: Caryl Bis RN 15:35 07/09/15 ([email or web address])    Report Status PENDING  Incomplete  C difficile quick scan w PCR reflex     Status: None   Collection Time: 06/21/15  4:29 PM  Result Value Ref Range Status   C Diff antigen NEGATIVE NEGATIVE Final   C Diff toxin NEGATIVE NEGATIVE Final   C Diff interpretation Negative for toxigenic C. difficile  Final  Giardia/Cryptosporidium EIA     Status: None   Collection Time: 06/21/15  4:29 PM  Result Value Ref Range Status   Giardia Ag, Stl Negative Negative Final   Cryptosporidium EIA Negative Negative Final    Comment: (NOTE) Performed At: [facility] [place], [place]  [id number] Lindon Romp MD [id number]    Source of Sample STOOL  Final  Culture, blood (Routine X 2) w Reflex to ID Panel     Status: None (Preliminary result)   Collection Time: 07/18/15 11:30 AM  Result Value Ref Range Status  Specimen Description BLOOD RIGHT ANTECUBITAL  Final   Special Requests BOTTLES DRAWN AEROBIC ONLY 5CC  Final   Culture NO GROWTH 2 DAYS  Final   Report Status PENDING  Incomplete  Culture, blood (Routine X 2) w Reflex to ID Panel     Status: None (Preliminary result)   Collection Time: 07/18/15 11:45 AM  Result Value Ref Range Status   Specimen Description BLOOD LEFT ANTECUBITAL  Final   Special Requests BOTTLES DRAWN AEROBIC ONLY 5CC  Final   Culture NO GROWTH 2 DAYS  Final   Report Status PENDING  Incomplete  Gastrointestinal Panel by PCR , Stool     Status: None   Collection Time: 07/18/15  3:44 PM  Result Value Ref Range Status   Campylobacter species NOT DETECTED NOT DETECTED Final   Plesimonas shigelloides NOT DETECTED NOT DETECTED Final   Salmonella species NOT DETECTED NOT DETECTED Final   Yersinia enterocolitica NOT DETECTED NOT DETECTED Final   Vibrio species NOT DETECTED NOT DETECTED Final   Vibrio cholerae NOT DETECTED NOT DETECTED Final   Enteroaggregative E coli (EAEC) NOT DETECTED NOT DETECTED Final   Enteropathogenic E coli (EPEC) NOT DETECTED NOT DETECTED Final   Enterotoxigenic E coli (ETEC) NOT DETECTED NOT DETECTED Final   Shiga like toxin producing E coli (STEC) NOT DETECTED NOT DETECTED Final   E. coli O157 NOT DETECTED NOT DETECTED Final   Shigella/Enteroinvasive E coli (EIEC) NOT DETECTED NOT DETECTED Final   Cryptosporidium NOT DETECTED NOT DETECTED Final   Cyclospora cayetanensis NOT DETECTED NOT DETECTED Final   Entamoeba histolytica NOT DETECTED NOT DETECTED Final   Giardia lamblia NOT DETECTED NOT DETECTED Final   Adenovirus F40/41 NOT DETECTED NOT DETECTED Final   Astrovirus NOT DETECTED NOT DETECTED Final    Norovirus GI/GII NOT DETECTED NOT DETECTED Final   Rotavirus A NOT DETECTED NOT DETECTED Final   Sapovirus (I, II, IV, and V) NOT DETECTED NOT DETECTED Final  AFB culture with smear     Status: None (Preliminary result)   Collection Time: 07/18/15  3:44 PM  Result Value Ref Range Status   Specimen Description STOOL  Final   Special Requests Immunocompromised  Final   Acid Fast Smear   Final    7183 3+ ACID FAST BACILLI SEEN CRITICAL RESULT CALLED TO, READ BACK BY AND VERIFIED WITH: ALBERT 14:45 08/18/14 [place] FAXED TO 832 Performed at [facility]   Final    CULTURE WILL BE EXAMINED FOR 6 WEEKS BEFORE ISSUING A FINAL REPORT Performed at [facility]    Report Status PENDING  Incomplete  C difficile quick scan w PCR reflex     Status: None   Collection Time: 07/19/15  1:43 PM  Result Value Ref Range Status   C Diff antigen NEGATIVE NEGATIVE Final   C Diff toxin NEGATIVE NEGATIVE Final   C Diff interpretation Negative for toxigenic C. difficile  Final    Studies/Results: Dg Abd Portable 1v  07/19/2015  CLINICAL DATA:  Abdominal pain, HIV, smoker EXAM: PORTABLE ABDOMEN - 1 VIEW COMPARISON:  Correlation CT abdomen and pelvis 05/27/2015 FINDINGS: Multiple air-filled loops of small bowel in the upper abdomen likely jejunal, largest mildly dilated and showing minimal fold thickening. Findings are suggestive of the presence of enteritis. Gas is seen within the ascending and transverse colon. Paucity of gas within the descending colon and sigmoid colon. Gas at inferior pelvis likely question within urinary bladder or rectum ; has patient  undergone bladder catheterization? No urinary tract calcification or acute osseous findings. IMPRESSION: Mildly prominent small bowel loops in the LEFT upper quadrant and mid abdomen, likely jejuna, some which demonstrate mucosal fold thickening suggesting an enteritis, which can occur from numerous organisms in the setting of HIV/AIDS.  Electronically Signed   By: Lavonia Dana M.D.   On: 07/19/2015 12:16      Assessment/Plan:  INTERVAL HISTORY: patient started on M avium therapy   Principal Problem:   HCAP (healthcare-associated pneumonia) Active Problems:   AIDS ([place])   Tobacco abuse disorder   Arterial hypotension   Anemia due to bone marrow failure (HCC)   Abdominal pain, chronic, epigastric   HIV disease (HCC)   Chronic combined systolic and diastolic congestive heart failure (HCC)   Protein-calorie malnutrition (HCC)   Cardiomyopathy (HCC)   Weight loss   Diarrhea   UTI (urinary tract infection)   Oral thrush   Dehydration   Orthostatic hypotension    Miguel Campos is a 44 y.o. 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 WIll change from biaxin to Azithromycin for simplicity in dosing, and continue ETH  #3 Thrush: continue fluconazole change to orals  #4 Possible KS in OP and skin:  Would ask [facility] for punch biopsy of his right shin and to send to pathology  Would ask GI if they would consider EGD for visual inspection of his esophagus for possible KS  IF any lesions appear c/w KS, WOULD NOT recommend GI  BIOPSY THEM GIVEN HIGH RISK OF BLEEDING FROM THEM IN ESOPHAGUS  HE ALSO NEEDS BIOPSY OF HIS PALATE, would ask ENT to see him for biopsy  Continue ART and see if he has need for ONCOLOGY evalution for doxil  #5 Noncompliance: claims there is obstacle to obtaining meds. I called Sharyn Lull with [facility] and either she or bridge counselor will come today  #6 ? HCAP: dc vancomycin, observe on zosyn alone  #6 FTT: I think he needs either a SNF or [place] for rehab--the latter ONLY if they will allow him to take his ARVS, antifungals, m avium  drugs. Finally if he ends up needing doxil for KS they would not likely accomodate him  I spent greater than 40 minutes with the patient including greater than 50% of time in face to face counsel of the patient his sister  and in coordination of his  care.    LOS: 3 days   Alcide Evener 07/21/2015, 9:52 AM

## 2015-07-21 NOTE — Progress Notes (Signed)
PT Cancellation Note  Patient Details Name: Miguel Campos MRN: [id number] DOB: 26-Aug-1970   Cancelled Treatment:    Reason Eval/Treat Not Completed:  (refused). Returned at 1pm per patient request.pt received sitting EOB with report of doing UE there ex. Pt refused to participate in PT due to "I haven't eaten yet." PT to return as able.   Kingsley Callander 07/21/2015, 1:20 PM   Kittie Plater, PT, DPT Pager #: [phone] Office #: [phone]

## 2015-07-21 NOTE — Clinical Social Work Note (Signed)
CSW talked with patient and male friend/family member at the bedside regarding Medicaid. CSW explained process and that application could not be completed in hospital as patient currently has insurance. Per patient, his insurance may end in a day or two as he will not longer be working. CSW informed patient or steps that need to be taken if he is still in hospital when insurance ends or if he is discharged.   Kinzy Weyers Givens, MSW, LCSW Licensed Clinical Social Worker [place] [phone]

## 2015-07-21 NOTE — Progress Notes (Signed)
[facility] PROGRESS NOTE  Miguel Campos [id number] DOB: Mar 28, 1971 DOA: 07/18/2015 PCP: Isaias Cowman, PA-C  Assessment/Plan: #1 healthcare associated pneumonia Per chest x-ray and clinical symptoms. Patient states some clinical improvement. Patient also with hypotension. Patient has been pancultured results pending. Patient is afebrile. Patient with a leukopenia with white count of 2.9. Continue empiric Vancomycin and IV Zosyn. ID following.  #2 hypotension Likely secondary to hypovolemia and infectious etiology. Patient mentating well. Systolic blood pressures in the mid-high 90s. IV fluids. Also given patient IV albumin. Patient noted to have a hemoglobin of 7.3 on 07/20/2015. Patient is status post 2 units packed red blood cells with improvement with hypotension. Follow H&H. Follow.  #3 oral thrush Continue Diflucan.  #4 MAI Continue azithromycin, ethambutol per ID recommendations.Rifabutin has been d/c'd per ID.  #5 AIDS Patient's CD4 less than 10 on 06/20/2015. Viral load is 71700 from 06/20/2015. Genotype pending. Continue ART therapy per ID. ID following and appreciate input and recommendations.  #6 severe protein calorie malnutrition Patient has been started on Megace. Nutritional supplementation.  #7 diarrhea Improved. Stool studies negative to date. GI panel negative. C. difficile PCR negative. Stool ova and parasites pending. AFB culture with smear on stool positive. Patient on azithromycin, rifampin, ethambutol for MAI per ID. ID following.  #8 history of CHF/cardiomyopathy Prior 2-D echo with EF of 30-35%. Patient dehydrated and hypovolemic. Continue IV fluids. Beta blocker on hold secondary to hypotension. Follow.  #9 pancytopenia Likely secondary to AIDS. Patient with a white count of 2.9, hemoglobin of 7.3, platelet count of 78 on 07/20/2015.Marland Kitchen Patient denies any overt bleeding. Continue empiric IV antibiotics. S/P 2 units of packed red blood cells secondary  to drop his hemoglobin and his hypotension. Hemoglobin currently at 9.1 last night around 11 PM. Patient refused labs this morning. Follow.  #10 UTI Urine cultures pending. Continue IV Zosyn.  #11 prognosis Patient with a poor prognosis. Patient with AIDS and has been noncompliant with his medications. Patient presented for the second time to this hospital over the past 2 months and has been admitted 6 times over the past 2 months. Patient with failure to thrive. Patient is cachectic and deteriorating. Will likely need goals of care due to current poor health.   Code Status: Full Family Communication: Updated patient and family at bedside. Disposition Plan: Skilled nursing facility when medically stable.   Consultants:  Infectious diseases: Dr. Johnnye Sima 07/18/2015  Procedures:  Chest x-ray 07/18/2015  Abdominal films 07/19/2015  2 units packed red blood cells 07/20/2015  Antibiotics:  Oral clarithromycin 07/18/2015  Ethambutol 07/18/2015  IV Diflucan 07/18/2015  IV Zosyn 07/18/2015  IV vancomycin 07/18/2015  Oral Bactrim 07/18/2015  Rifabutin 07/18/2015  HPI/Subjective: Patient more alert and following commands. History questions appropriately. Denies chest pain. Denies shortness of breath. States has not had a bowel movement today. States diarrhea slowed down.  Objective: Filed Vitals:   07/20/15 2325 07/21/15 0514  BP: 87/56 95/62  Pulse: 84 64  Temp:  97.7 F (36.5 C)  Resp: 18 16    Intake/Output Summary (Last 24 hours) at 07/21/15 1441 Last data filed at 07/21/15 1300  Gross per 24 hour  Intake   3278 ml  Output    200 ml  Net   3078 ml   Filed Weights   07/18/15 2103 07/19/15 2147  Weight: 49.3 kg (108 lb 11 oz) 49 kg (108 lb 0.4 oz)    Exam:   General:  NAD. Cachetic. Frail  Cardiovascular: Regular rate rhythm  Respiratory: Clear to auscultation bilaterally anterior lung fields.  Abdomen: Soft, nontender, nondistended, positive bowel  sounds.  Musculoskeletal: No clubbing or cyanosis. Bilateral pedal edema.  Data Reviewed: Basic Metabolic Panel:  Recent Labs Lab 07/18/15 1110 07/19/15 0945 07/20/15 0439  NA 138 139 140  K 3.8 3.1* 2.8*  CL 113* 115* 120*  CO2 17* 15* 13*  GLUCOSE 67 79 62*  BUN 42* 41* 28*  CREATININE 1.19 1.34* 1.40*  CALCIUM 7.5* 7.0* 7.3*  MG  --  1.6* 2.5*   Liver Function Tests:  Recent Labs Lab 07/18/15 1110  AST 61*  ALT 25  ALKPHOS 295*  BILITOT 0.6  PROT 3.9*  ALBUMIN <1.0*    Recent Labs Lab 07/18/15 1110  LIPASE 30   No results for input(s): AMMONIA in the last 168 hours. CBC:  Recent Labs Lab 07/18/15 1110 07/19/15 0945 07/20/15 0439 07/20/15 2303  WBC 3.6* 2.7* 2.9*  --   NEUTROABS 3.1  --  2.6  --   HGB 9.9* 9.4* 7.3* 9.1*  HCT 30.8* 30.1* 22.6* 27.5*  MCV 77.2* 76.8* 74.8*  --   PLT 111* 82* 78*  --    Cardiac Enzymes: No results for input(s): CKTOTAL, CKMB, CKMBINDEX, TROPONINI in the last 168 hours. BNP (last 3 results)  Recent Labs  06/20/15 2002  BNP 243.8*    ProBNP (last 3 results) No results for input(s): PROBNP in the last 8760 hours.  CBG:  Recent Labs Lab 07/18/15 1108 07/18/15 1132  GLUCAP 43* 137*    Recent Results (from the past 240 hour(s))  Culture, blood (Routine X 2) w Reflex to ID Panel     Status: None (Preliminary result)   Collection Time: 07/18/15 11:30 AM  Result Value Ref Range Status   Specimen Description BLOOD RIGHT ANTECUBITAL  Final   Special Requests BOTTLES DRAWN AEROBIC ONLY 5CC  Final   Culture NO GROWTH 3 DAYS  Final   Report Status PENDING  Incomplete  Culture, blood (Routine X 2) w Reflex to ID Panel     Status: None (Preliminary result)   Collection Time: 07/18/15 11:45 AM  Result Value Ref Range Status   Specimen Description BLOOD LEFT ANTECUBITAL  Final   Special Requests BOTTLES DRAWN AEROBIC ONLY 5CC  Final   Culture NO GROWTH 3 DAYS  Final   Report Status PENDING  Incomplete   Gastrointestinal Panel by PCR , Stool     Status: None   Collection Time: 07/18/15  3:44 PM  Result Value Ref Range Status   Campylobacter species NOT DETECTED NOT DETECTED Final   Plesimonas shigelloides NOT DETECTED NOT DETECTED Final   Salmonella species NOT DETECTED NOT DETECTED Final   Yersinia enterocolitica NOT DETECTED NOT DETECTED Final   Vibrio species NOT DETECTED NOT DETECTED Final   Vibrio cholerae NOT DETECTED NOT DETECTED Final   Enteroaggregative E coli (EAEC) NOT DETECTED NOT DETECTED Final   Enteropathogenic E coli (EPEC) NOT DETECTED NOT DETECTED Final   Enterotoxigenic E coli (ETEC) NOT DETECTED NOT DETECTED Final   Shiga like toxin producing E coli (STEC) NOT DETECTED NOT DETECTED Final   E. coli O157 NOT DETECTED NOT DETECTED Final   Shigella/Enteroinvasive E coli (EIEC) NOT DETECTED NOT DETECTED Final   Cryptosporidium NOT DETECTED NOT DETECTED Final   Cyclospora cayetanensis NOT DETECTED NOT DETECTED Final   Entamoeba histolytica NOT DETECTED NOT DETECTED Final   Giardia lamblia NOT DETECTED NOT DETECTED Final   Adenovirus F40/41 NOT DETECTED NOT DETECTED  Final   Astrovirus NOT DETECTED NOT DETECTED Final   Norovirus GI/GII NOT DETECTED NOT DETECTED Final   Rotavirus A NOT DETECTED NOT DETECTED Final   Sapovirus (I, II, IV, and V) NOT DETECTED NOT DETECTED Final  AFB culture with smear     Status: None (Preliminary result)   Collection Time: 07/18/15  3:44 PM  Result Value Ref Range Status   Specimen Description STOOL  Final   Special Requests Immunocompromised  Final   Acid Fast Smear   Final    7183 3+ ACID FAST BACILLI SEEN CRITICAL RESULT CALLED TO, READ BACK BY AND VERIFIED WITH: ALBERT 14:45 08/18/14 [place] FAXED TO 832 Performed at [facility]    Culture   Final    CULTURE WILL BE EXAMINED FOR 6 WEEKS BEFORE ISSUING A FINAL REPORT Performed at [facility]    Report Status PENDING  Incomplete  C difficile quick scan w PCR reflex      Status: None   Collection Time: 07/19/15  1:43 PM  Result Value Ref Range Status   C Diff antigen NEGATIVE NEGATIVE Final   C Diff toxin NEGATIVE NEGATIVE Final   C Diff interpretation Negative for toxigenic C. difficile  Final     Studies: No results found.  Scheduled Meds: . antiseptic oral rinse  7 mL Mouth Rinse BID  . azithromycin  600 mg Oral Daily  . dolutegravir  50 mg Oral Daily  . emtricitabine-tenofovir AF  1 tablet Oral Daily  . ethambutol  15 mg/kg Oral Daily  . famotidine (PEPCID) IV  20 mg Intravenous Q24H  . feeding supplement  1 Container Oral TID WC  . feeding supplement (ENSURE ENLIVE)  237 mL Oral BID BM  . ferrous sulfate  325 mg Oral Daily  . fluconazole  100 mg Oral Daily  . folic acid  1 mg Oral Daily  . megestrol  400 mg Oral Daily  . nicotine  21 mg Transdermal Daily  . pantoprazole  40 mg Oral Daily  . piperacillin-tazobactam (ZOSYN)  IV  3.375 g Intravenous Q8H  . sodium chloride  3 mL Intravenous Q12H  . sulfamethoxazole-trimethoprim  1 tablet Oral Daily   Continuous Infusions: .  sodium bicarbonate 150 mEq in sterile water 1000 mL infusion 75 mL/hr at 07/21/15 [id number]    Principal Problem:   HCAP (healthcare-associated pneumonia) Active Problems:   AIDS ([place])   Tobacco abuse disorder   Arterial hypotension   Anemia due to bone marrow failure (HCC)   Abdominal pain, chronic, epigastric   HIV disease (HCC)   Chronic combined systolic and diastolic congestive heart failure (HCC)   Protein-calorie malnutrition ([place])   Cardiomyopathy ([place])   Weight loss   Diarrhea   UTI (urinary tract infection)   Oral thrush   Dehydration   Orthostatic hypotension   Cachexia ([place])   [place]'s disease    Time spent: 48 mins    [facility] MD [facility] Pager [phone]. If 7PM-7AM, please contact night-coverage at [email or web address], password [facility] 07/21/2015, 2:41 PM  LOS: 3 days

## 2015-07-21 NOTE — Progress Notes (Signed)
PT Cancellation Note  Patient Details Name: Miguel Campos MRN: [id number] DOB: Jan 21, 1971   Cancelled Treatment:    Reason Eval/Treat Not Completed:  (pt refused). Pt adamantly refused due to "Im just too weak right now." Sister present and supported patient and agreed PT as not in his best interest at this time. Pt and sister educated on role of PT in the hospital and the importance of initiating mobility and getting OOB. Pt reports "my goal is to walk but I can't do that right now." PT explained how PT helps build strength and endurance to progress towards his goals. Pt and sister con't to refuse. PT to return as able.   Kingsley Callander 07/21/2015, 9:55 AM   Kittie Plater, PT, DPT Pager #: [phone] Office #: [phone]

## 2015-07-21 NOTE — Progress Notes (Signed)
Patient continnues to have watery stools.  Dark green in color. Will update MD in am.

## 2015-07-21 NOTE — Evaluation (Signed)
Occupational Therapy Evaluation Patient Details Name: Miguel Campos MRN: [id number] DOB: 1971/07/07 Today's Date: 07/21/2015    History of Present Illness 44 yo male admitted PNA, oral thrush, UTI, diarrhea PMH: HTN, AIDS, noncompliant with medications, CHF   Clinical Impression   PT admitted with PNA. Pt currently with functional limitiations due to the deficits listed below (see OT problem list). PTA patient received (A) for all adls and mobility from mother. Pt used hand held (A) to walk with mother. Pt must be able to climb 5 steps once inside the house to reach bedroom. Pt will benefit from skilled OT to increase their independence and safety with adls and balance to allow discharge likely SNF at current level. Pt requesting to return home. Question able to get in the house and declined oob this session.      Follow Up Recommendations  Other (comment) (TBA- pt may require SNF level care)    Equipment Recommendations  Wheelchair (measurements OT);Wheelchair cushion (measurements OT);Other (comment);3 in 1 bedside comode (RW)    Recommendations for Other Services       Precautions / Restrictions Precautions Precautions: Fall      Mobility Bed Mobility Overal bed mobility: Needs Assistance Bed Mobility: Rolling           General bed mobility comments: Pt attempting to log roll R and unable to complete task. Pt wanting to attempt without (A) from therapist.    Transfers                 General transfer comment: unwilling to attempt at this time    Balance                                            ADL Overall ADL's : Needs assistance/impaired Eating/Feeding: Set up;Bed level   Grooming: Wash/dry face;Bed level   Upper Body Bathing: Maximal assistance   Lower Body Bathing: Total assistance             Toilet Transfer Details (indicate cue type and reason): reports difficulty with transfers and goal is to complete toilet  transfers           General ADL Comments: pt bed level at this time with pain in R LE. Pt unable to log roll with R total (A) .     Vision     Perception     Praxis      Pertinent Vitals/Pain Pain Assessment: Faces Faces Pain Scale: Hurts little more Pain Location: R Knee  Pain Descriptors / Indicators: Burning Pain Intervention(s): Monitored during session     Hand Dominance Right   Extremity/Trunk Assessment Upper Extremity Assessment Upper Extremity Assessment: Generalized weakness;RUE deficits/detail;LUE deficits/detail RUE Deficits / Details: 3 out 5 difficulty with internal adduction  LUE Deficits / Details: 3 out 5, reports some discomfort with shoulder flexion, difficulty with internal adduction   Lower Extremity Assessment Lower Extremity Assessment: RLE deficits/detail;Generalized weakness RLE Deficits / Details: reports pain / unwilling at this time to complete OOB. Pt observed with knee flexion and hip external rotation    Cervical / Trunk Assessment Cervical / Trunk Assessment: Normal   Communication Communication Communication: No difficulties   Cognition Arousal/Alertness: Awake/alert Behavior During Therapy: WFL for tasks assessed/performed Overall Cognitive Status: Within Functional Limits for tasks assessed  General Comments       Exercises Exercises: Other exercises Other Exercises Other Exercises: theraband level 1 - 5 reps  abduction shoulder, shoulder flexion, shoulder adduction  handout provided   Shoulder Instructions      Home Living Family/patient expects to be discharged to:: Private residence Living Arrangements: Parent Available Help at Discharge: Family;Available 24 hours/day Type of Home: House Home Access: Stairs to enter [employer] of Steps: 3 Entrance Stairs-Rails: Left Home Layout: Multi-level;Bed/bath upstairs (normally stays in bedroom) Alternate Level Stairs-Number of Steps:  5-6   Bathroom Shower/Tub: Teacher, early years/pre: Handicapped height Bathroom Accessibility: Yes   Home Equipment: None          Prior Functioning/Environment Level of Independence: Needs assistance  Gait / Transfers Assistance Needed: (A) hand held with mother to all transfers ADL's / [place] Needed: (A) with all adls. Mom reports "dizziness and weak" Sponge bath on a weak day        OT Diagnosis: Generalized weakness;Acute pain   OT Problem List: Decreased strength;Decreased activity tolerance;Impaired balance (sitting and/or standing);Decreased safety awareness;Decreased knowledge of use of DME or AE;Decreased knowledge of precautions;Pain   OT Treatment/Interventions: Self-care/ADL training;Therapeutic exercise;DME and/or AE instruction;Therapeutic activities;Patient/family education;Balance training    OT Goals(Current goals can be found in the care plan section) Acute Rehab OT Goals Patient Stated Goal: to be able to walk to the bathroom OT Goal Formulation: With patient Time For Goal Achievement: 08/04/15 Potential to Achieve Goals: Good  OT Frequency: Min 2X/week   Barriers to D/C:            Co-evaluation              End of Session Nurse Communication: Mobility status;Precautions  Activity Tolerance: Patient limited by pain Patient left: in bed;with call bell/phone within reach   Time: 0740-0814 OT Time Calculation (min): 34 min Charges:  OT General Charges $OT Visit: 1 Procedure OT Evaluation $Initial OT Evaluation Tier I: 1 Procedure OT Treatments $Therapeutic Exercise: 8-22 mins G-Codes:    Parke Poisson B 2015-08-19, 8:38 AM  Jeri Modena   OTR/L PagerIP:[phone] Office: [phone] .

## 2015-07-22 DIAGNOSIS — Z515 Encounter for palliative care: Secondary | ICD-10-CM

## 2015-07-22 DIAGNOSIS — D6109 Other constitutional aplastic anemia: Secondary | ICD-10-CM

## 2015-07-22 DIAGNOSIS — E876 Hypokalemia: Secondary | ICD-10-CM | POA: Insufficient documentation

## 2015-07-22 DIAGNOSIS — A319 Mycobacterial infection, unspecified: Secondary | ICD-10-CM | POA: Insufficient documentation

## 2015-07-22 LAB — COMPREHENSIVE METABOLIC PANEL
ALBUMIN: 1.3 g/dL — AB (ref 3.5–5.0)
ALK PHOS: 193 U/L — AB (ref 38–126)
ALT: 16 U/L — AB (ref 17–63)
AST: 36 U/L (ref 15–41)
Anion gap: 6 (ref 5–15)
BILIRUBIN TOTAL: 1 mg/dL (ref 0.3–1.2)
BUN: 21 mg/dL — AB (ref 6–20)
CALCIUM: 7 mg/dL — AB (ref 8.9–10.3)
CO2: 20 mmol/L — ABNORMAL LOW (ref 22–32)
CREATININE: 1.33 mg/dL — AB (ref 0.61–1.24)
Chloride: 116 mmol/L — ABNORMAL HIGH (ref 101–111)
GFR calc Af Amer: >60 mL/min (ref >60)
GLUCOSE: 62 mg/dL — AB (ref 65–99)
Potassium: 2.4 mmol/L — CL (ref 3.5–5.1)
Sodium: 142 mmol/L (ref 135–145)
TOTAL PROTEIN: 4.1 g/dL — AB (ref 6.5–8.1)

## 2015-07-22 LAB — CBC
HCT: 32.2 % — ABNORMAL LOW (ref 39.0–52.0)
Hemoglobin: 10.9 g/dL — ABNORMAL LOW (ref 13.0–17.0)
MCH: 25.5 pg — AB (ref 26.0–34.0)
MCHC: 33.9 g/dL (ref 30.0–36.0)
MCV: 75.2 fL — AB (ref 78.0–100.0)
Platelets: 55 10*3/uL — ABNORMAL LOW (ref 150–400)
RBC: 4.28 MIL/uL (ref 4.22–5.81)
RDW: 18.5 % — AB (ref 11.5–15.5)
WBC: 3.3 10*3/uL — AB (ref 4.0–10.5)

## 2015-07-22 LAB — MAGNESIUM: MAGNESIUM: 1.8 mg/dL (ref 1.7–2.4)

## 2015-07-22 MED ORDER — MAGNESIUM SULFATE 50 % IJ SOLN
3.0000 g | Freq: Once | INTRAVENOUS | Status: AC
  Administered 2015-07-23: 3 g via INTRAVENOUS
  Filled 2015-07-22 (×2): qty 6

## 2015-07-22 MED ORDER — POTASSIUM CHLORIDE 10 MEQ/100ML IV SOLN
10.0000 meq | INTRAVENOUS | Status: AC
  Administered 2015-07-22 – 2015-07-23 (×3): 10 meq via INTRAVENOUS
  Filled 2015-07-22 (×2): qty 100

## 2015-07-22 MED ORDER — POTASSIUM CHLORIDE CRYS ER 20 MEQ PO TBCR
40.0000 meq | EXTENDED_RELEASE_TABLET | ORAL | Status: AC
  Administered 2015-07-22 – 2015-07-23 (×2): 40 meq via ORAL
  Filled 2015-07-22 (×2): qty 2

## 2015-07-22 NOTE — Progress Notes (Signed)
Pharmacy Antibiotic Follow-up Note  Kaige Evins is a 44 y.o. year-old male admitted on 07/18/2015.  The patient is currently on day 5 of Zosyn for pneumonia.  Assessment/Plan: - Continue Zosyn 3.375g IV q8h - Monitor C&S, vitals and clinical progression  Temp (24hrs), Avg:97.9 F (36.6 C), Min:97.5 F (36.4 C), Max:98.2 F (36.8 C)   Recent Labs Lab 07/18/15 1110 07/19/15 0945 07/20/15 0439  WBC 3.6* 2.7* 2.9*    Recent Labs Lab 07/18/15 1110 07/19/15 0945 07/20/15 0439  CREATININE 1.19 1.34* 1.40*   Estimated Creatinine Clearance: 63.6 mL/min (by C-G formula based on Cr of 1.4).    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  Ricka Burdock PharmD 07/22/2015 9:28 AM

## 2015-07-22 NOTE — Progress Notes (Signed)
[facility] PROGRESS NOTE  Miguel Campos [id number] DOB: 1970-12-14 DOA: 07/18/2015 PCP: Isaias Cowman, PA-C  Assessment/Plan: #1 healthcare associated pneumonia Per chest x-ray and clinical symptoms. Patient states some clinical improvement. Patient also with hypotension. Patient has been pancultured results pending. Patient is afebrile. Patient with a leukopenia with white count of 2.9. Continue empiric IV Zosyn. ID following.  #2 hypotension Likely secondary to hypovolemia and infectious etiology. Patient mentating well. Systolic blood pressures in the mid-high 90s. IV fluids. Also given patient IV albumin. Patient noted to have a hemoglobin of 7.3 on 07/20/2015. Patient is status post 2 units packed red blood cells with improvement with hypotension. Follow H&H. Follow.  #3 oral thrush Continue Diflucan.  #4 MAI Continue azithromycin, ethambutol per ID recommendations.Rifabutin has been d/c'd per ID.  #5 AIDS Patient's CD4 less than 10 on 06/20/2015. Viral load is 71700 from 06/20/2015. Genotype pending. Continue ART therapy per ID. ID following and appreciate input and recommendations.  #6 severe protein calorie malnutrition Patient has been started on Megace. Nutritional supplementation.  #7 diarrhea Improved. Stool studies negative to date. GI panel negative. C. difficile PCR negative. Stool ova and parasites pending. AFB culture with smear on stool positive. Patient on azithromycin, rifampin, ethambutol for MAI per ID. ID following.  #8 history of CHF/cardiomyopathy Prior 2-D echo with EF of 30-35%. Patient dehydrated and hypovolemic. Continue IV fluids. Beta blocker on hold secondary to hypotension. Follow.  #9 pancytopenia Likely secondary to AIDS. Patient with a white count of 2.9, hemoglobin of 7.3, platelet count of 78 on 07/20/2015.Marland Kitchen Patient denies any overt bleeding. Continue empiric IV antibiotics. S/P 2 units of packed red blood cells secondary to drop his  hemoglobin and his hypotension. Hemoglobin currently at 10.9.  Follow.  #10 UTI Urine cultures pending. Continue IV Zosyn.  #11 hypokalemia/hypomagnesemia Likely secondary to GI losses. Replete.  #12 prognosis Patient with a poor prognosis. Patient with AIDS and has been noncompliant with his medications. Patient presented for the second time to this hospital over the past 2 months and has been admitted 6 times over the past 2 months. Patient with failure to thrive. Patient is cachectic and deteriorating. Will likely need goals of care due to current poor health.   Code Status: Full Family Communication: Updated patient and family at bedside. Disposition Plan: Skilled nursing facility when medically stable.   Consultants:  Infectious diseases: Dr. Johnnye Sima 07/18/2015  Procedures:  Chest x-ray 07/18/2015  Abdominal films 07/19/2015  2 units packed red blood cells 07/20/2015  Antibiotics:  Oral clarithromycin 07/18/2015  Ethambutol 07/18/2015  IV Diflucan 07/18/2015  IV Zosyn 07/18/2015  IV vancomycin 07/18/2015>>>>07/21/2015  Oral Bactrim 07/18/2015  Rifabutin 07/18/2015  HPI/Subjective: Patient more alert and following commands. Denies chest pain. Denies shortness of breath. States diarrhea slowed down.  Objective: Filed Vitals:   07/22/15 0612 07/22/15 0949  BP: 106/73 95/59  Pulse: 81 92  Temp: 98.2 F (36.8 C) 99.1 F (37.3 C)  Resp: 16 16    Intake/Output Summary (Last 24 hours) at 07/22/15 1640 Last data filed at 07/22/15 1300  Gross per 24 hour  Intake   2270 ml  Output      0 ml  Net   2270 ml   Filed Weights   07/18/15 2103 07/19/15 2147 07/21/15 2059  Weight: 49.3 kg (108 lb 11 oz) 49 kg (108 lb 0.4 oz) 68.493 kg (151 lb)    Exam:   General:  NAD. Cachetic. Frail  Cardiovascular: Regular rate rhythm  Respiratory:  Clear to auscultation bilaterally anterior lung fields.  Abdomen: Soft, nontender, nondistended, positive bowel  sounds.  Musculoskeletal: No clubbing or cyanosis. Bilateral pedal edema.  Data Reviewed: Basic Metabolic Panel:  Recent Labs Lab 07/18/15 1110 07/19/15 0945 07/20/15 0439 07/22/15 0955  NA 138 139 140 142  K 3.8 3.1* 2.8* 2.4*  CL 113* 115* 120* 116*  CO2 17* 15* 13* 20*  GLUCOSE 67 79 62* 62*  BUN 42* 41* 28* 21*  CREATININE 1.19 1.34* 1.40* 1.33*  CALCIUM 7.5* 7.0* 7.3* 7.0*  MG  --  1.6* 2.5* 1.8   Liver Function Tests:  Recent Labs Lab 07/18/15 1110 07/22/15 0955  AST 61* 36  ALT 25 16*  ALKPHOS 295* 193*  BILITOT 0.6 1.0  PROT 3.9* 4.1*  ALBUMIN <1.0* 1.3*    Recent Labs Lab 07/18/15 1110  LIPASE 30   No results for input(s): AMMONIA in the last 168 hours. CBC:  Recent Labs Lab 07/18/15 1110 07/19/15 0945 07/20/15 0439 07/20/15 2303 07/22/15 0955  WBC 3.6* 2.7* 2.9*  --  3.3*  NEUTROABS 3.1  --  2.6  --   --   HGB 9.9* 9.4* 7.3* 9.1* 10.9*  HCT 30.8* 30.1* 22.6* 27.5* 32.2*  MCV 77.2* 76.8* 74.8*  --  75.2*  PLT 111* 82* 78*  --  55*   Cardiac Enzymes: No results for input(s): CKTOTAL, CKMB, CKMBINDEX, TROPONINI in the last 168 hours. BNP (last 3 results)  Recent Labs  06/20/15 2002  BNP 243.8*    ProBNP (last 3 results) No results for input(s): PROBNP in the last 8760 hours.  CBG:  Recent Labs Lab 07/18/15 1108 07/18/15 1132  GLUCAP 43* 137*    Recent Results (from the past 240 hour(s))  Culture, blood (Routine X 2) w Reflex to ID Panel     Status: None (Preliminary result)   Collection Time: 07/18/15 11:30 AM  Result Value Ref Range Status   Specimen Description BLOOD RIGHT ANTECUBITAL  Final   Special Requests BOTTLES DRAWN AEROBIC ONLY 5CC  Final   Culture NO GROWTH 4 DAYS  Final   Report Status PENDING  Incomplete  Culture, blood (Routine X 2) w Reflex to ID Panel     Status: None (Preliminary result)   Collection Time: 07/18/15 11:45 AM  Result Value Ref Range Status   Specimen Description BLOOD LEFT ANTECUBITAL   Final   Special Requests BOTTLES DRAWN AEROBIC ONLY 5CC  Final   Culture NO GROWTH 4 DAYS  Final   Report Status PENDING  Incomplete  Gastrointestinal Panel by PCR , Stool     Status: None   Collection Time: 07/18/15  3:44 PM  Result Value Ref Range Status   Campylobacter species NOT DETECTED NOT DETECTED Final   Plesimonas shigelloides NOT DETECTED NOT DETECTED Final   Salmonella species NOT DETECTED NOT DETECTED Final   Yersinia enterocolitica NOT DETECTED NOT DETECTED Final   Vibrio species NOT DETECTED NOT DETECTED Final   Vibrio cholerae NOT DETECTED NOT DETECTED Final   Enteroaggregative E coli (EAEC) NOT DETECTED NOT DETECTED Final   Enteropathogenic E coli (EPEC) NOT DETECTED NOT DETECTED Final   Enterotoxigenic E coli (ETEC) NOT DETECTED NOT DETECTED Final   Shiga like toxin producing E coli (STEC) NOT DETECTED NOT DETECTED Final   E. coli O157 NOT DETECTED NOT DETECTED Final   Shigella/Enteroinvasive E coli (EIEC) NOT DETECTED NOT DETECTED Final   Cryptosporidium NOT DETECTED NOT DETECTED Final   Cyclospora cayetanensis NOT DETECTED NOT  DETECTED Final   Entamoeba histolytica NOT DETECTED NOT DETECTED Final   Giardia lamblia NOT DETECTED NOT DETECTED Final   Adenovirus F40/41 NOT DETECTED NOT DETECTED Final   Astrovirus NOT DETECTED NOT DETECTED Final   Norovirus GI/GII NOT DETECTED NOT DETECTED Final   Rotavirus A NOT DETECTED NOT DETECTED Final   Sapovirus (I, II, IV, and V) NOT DETECTED NOT DETECTED Final  AFB culture with smear     Status: None (Preliminary result)   Collection Time: 07/18/15  3:44 PM  Result Value Ref Range Status   Specimen Description STOOL  Final   Special Requests Immunocompromised  Final   Acid Fast Smear   Final    7183 3+ ACID FAST BACILLI SEEN CRITICAL RESULT CALLED TO, READ BACK BY AND VERIFIED WITH: ALBERT 14:45 08/18/14 [place] FAXED TO 832 Performed at [facility]    Culture   Final    CULTURE WILL BE EXAMINED FOR 6 WEEKS BEFORE  ISSUING A FINAL REPORT Performed at [facility]    Report Status PENDING  Incomplete  C difficile quick scan w PCR reflex     Status: None   Collection Time: 07/19/15  1:43 PM  Result Value Ref Range Status   C Diff antigen NEGATIVE NEGATIVE Final   C Diff toxin NEGATIVE NEGATIVE Final   C Diff interpretation Negative for toxigenic C. difficile  Final     Studies: No results found.  Scheduled Meds: . antiseptic oral rinse  7 mL Mouth Rinse BID  . azithromycin  600 mg Oral Daily  . dolutegravir  50 mg Oral Daily  . emtricitabine-tenofovir AF  1 tablet Oral Daily  . ethambutol  15 mg/kg Oral Daily  . famotidine (PEPCID) IV  20 mg Intravenous Q24H  . feeding supplement  1 Container Oral TID WC  . feeding supplement (ENSURE ENLIVE)  237 mL Oral BID BM  . ferrous sulfate  325 mg Oral Daily  . fluconazole  100 mg Oral Daily  . folic acid  1 mg Oral Daily  . megestrol  400 mg Oral Daily  . nicotine  21 mg Transdermal Daily  . pantoprazole  40 mg Oral Daily  . piperacillin-tazobactam (ZOSYN)  IV  3.375 g Intravenous Q8H  . sodium chloride  3 mL Intravenous Q12H  . sulfamethoxazole-trimethoprim  1 tablet Oral Daily   Continuous Infusions: .  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 If 7PM-7AM, please contact  night-coverage at [email or web address], password [facility] 07/22/2015, 4:40 PM  LOS: 4 days

## 2015-07-22 NOTE — Consult Note (Signed)
Consultation Note Date: 07/22/2015   Patient Name: Miguel Campos  DOB: November 27, 1970  MRN: [id number]  Age / Sex: 44 y.o., male  PCP: Miguel Ginger, PA-C Referring Physician: Eugenie Filler, MD  Reason for Consultation: Establishing goals of care    Clinical Assessment/Narrative: I met with Miguel Campos today. We discussed his worsening health status and barriers to improvement. I ask him about his worries and fears associated with his declining health. He says he is not worried about anything except his mother. He lives with his mother and stepfather and says that they help him especially when he is weak. He says that his brother died ~3 years ago and he feels his mother still isn't over his death. He also worries about his 75 yo daughter. I ask him about dying and he says he is not afraid to die. I ask if he has thought about or talked about his wishes if his health were to continue to decline - he says that he has had conversations with his mother and he names her as his surrogate decision maker. I began to ask about his wishes as far as resuscitation when he asks that we change the subject. He says that he tries not to think about this and tries to stay positive.   His goal now is to follow plan outlined by infectious disease. We talked about the importance of him taking his medications - he tells me that he could not afford his meds and I told him to always take his meds and to call his doctor if cost or obtaining meds is an issue and that they will work with him as there are resources - he says that he realizes this now. We also discussed the importance to continue working with PT and getting out of bed as the more he is in bed the harder it will become to gain any strength. He would like to work to try and become stronger and more independent if possible.   Contacts/Participants in Discussion: Primary Decision Maker:  Patient   Mother is surrogate  SUMMARY OF RECOMMENDATIONS - Continue with diagnostic testing and aggressive care with hopes of some kind of improvement.  - Encouraged him to participate in care with therapy.  - Consider trazodone 25 mg qhs for sleep.  - I will follow up tomorrow.   Code Status/Advance Care Planning: Full code    Code Status Orders        Start     Ordered   07/18/15 1544  Full code   Continuous     07/18/15 1543      Symptom Management:   Sleep: Consider trazodone 25 mg qhs.   Abd pain with eating: GI consult for EGD and eval for possible kaposi sarcoma.   Decreased appetite/malnutriton/weight loss: Encourage meals. Continue supplements.   Miguel Campos is improving per patient.   Diarrhea is improving per patient.   Palliative Prophylaxis:   Bowel Regimen, Frequent Pain Assessment and Oral Care  Additional Recommendations (Limitations, Scope, Preferences):  Full Scope Treatment  Psycho-social/Spiritual:  Support System: Adequate Desire for further Chaplaincy support:no Additional Recommendations: Caregiving  Support/Resources  Prognosis: Unable to determine - poor based on weight loss and quickly declining health.   Discharge Planning: To be determined. Would benefit from skilled rehab if he will agree to this.    Chief Complaint/ Primary Diagnoses: Present on Admission:  . Abdominal pain, chronic, epigastric . Diarrhea . HCAP (healthcare-associated pneumonia) . UTI (urinary tract infection) .  HIV disease (Miguel Campos) . AIDS (Miguel Campos) . Anemia due to bone marrow failure (Miguel Campos) . Arterial hypotension . Chronic combined systolic and diastolic congestive heart failure (Miguel Campos) . Protein-calorie malnutrition (Miguel Campos) . Tobacco abuse disorder . Weight loss . Oral thrush  I have reviewed the medical record, interviewed the patient and family, and examined the patient. The following aspects are pertinent.  Past Medical History  Diagnosis Date  . HIV (human  immunodeficiency virus infection) ([place])    Social History   Social History  . Marital Status: Divorced    Spouse Name: N/A  . Number of Children: N/A  . Years of Education: N/A   Social History Main Topics  . Smoking status: Current Every Day Smoker -- 1.00 packs/day    Types: Cigarettes  . Smokeless tobacco: None  . Alcohol Use: 0.0 oz/week    0 Standard drinks or equivalent per week  . Drug Use: Yes    Special: Marijuana     Comment: occasional marijuana, did hard drug in the remote past but not recent  . Sexual Activity: Not Asked   Other Topics Concern  . None   Social History Narrative   Family History  Problem Relation Age of Onset  . Diabetes Mellitus II Father   . Heart failure Brother     in his 70s   Scheduled Meds: . antiseptic oral rinse  7 mL Mouth Rinse BID  . azithromycin  600 mg Oral Daily  . dolutegravir  50 mg Oral Daily  . emtricitabine-tenofovir AF  1 tablet Oral Daily  . ethambutol  15 mg/kg Oral Daily  . famotidine (PEPCID) IV  20 mg Intravenous Q24H  . feeding supplement  1 Container Oral TID WC  . feeding supplement (ENSURE ENLIVE)  237 mL Oral BID BM  . ferrous sulfate  325 mg Oral Daily  . fluconazole  100 mg Oral Daily  . folic acid  1 mg Oral Daily  . megestrol  400 mg Oral Daily  . nicotine  21 mg Transdermal Daily  . pantoprazole  40 mg Oral Daily  . piperacillin-tazobactam (ZOSYN)  IV  3.375 g Intravenous Q8H  . sodium chloride  3 mL Intravenous Q12H  . sulfamethoxazole-trimethoprim  1 tablet Oral Daily   Continuous Infusions: .  sodium bicarbonate 150 mEq in sterile water 1000 mL infusion 75 mL/hr at 07/22/15 0557   PRN Meds:.HYDROcodone-acetaminophen Medications Prior to Admission:  Prior to Admission medications   Medication Sig Start Date End Date Taking? Authorizing Provider  azithromycin (ZITHROMAX) 600 MG tablet Take 2 tablets (1,200 mg total) by mouth once a week. 06/27/15  Yes Lavina Hamman, MD  carvedilol (COREG)  6.25 MG tablet Take 1 tablet (6.25 mg total) by mouth 2 (two) times daily with a meal. 07/08/15  Yes Imogene Burn, PA-C  ferrous sulfate 325 (65 FE) MG tablet Take 1 tablet (325 mg total) by mouth daily. 05/29/15  Yes Marella Chimes, PA-C  omeprazole (PRILOSEC) 20 MG capsule Take 1 capsule (20 mg total) by mouth daily. 05/27/15  Yes Carlisle Cater, PA-C  dolutegravir (TIVICAY) 50 MG tablet Take 1 tablet (50 mg total) by mouth daily. Patient not taking: Reported on 07/18/2015 06/24/15   Lavina Hamman, MD  emtricitabine-tenofovir AF (DESCOVY) 200-25 MG tablet Take 1 tablet by mouth daily. Patient not taking: Reported on 07/18/2015 06/24/15   Lavina Hamman, MD  feeding supplement, ENSURE ENLIVE, (ENSURE ENLIVE) LIQD Take 237 mLs by mouth 3 (three) times daily between  meals. Patient not taking: Reported on 07/18/2015 06/24/15   Lavina Hamman, MD  folic acid (FOLVITE) 1 MG tablet Take 1 tablet (1 mg total) by mouth daily. Patient not taking: Reported on 07/18/2015 06/24/15   Lavina Hamman, MD  polyethylene glycol powder (GLYCOLAX/MIRALAX) powder Take 17 g by mouth daily. Patient not taking: Reported on 07/18/2015 05/27/15   Carlisle Cater, PA-C   No Known Allergies  Review of Systems  Constitutional: Positive for activity change, appetite change and fatigue.  Gastrointestinal: Positive for abdominal pain and diarrhea.  Psychiatric/Behavioral: Positive for sleep disturbance.    Physical Exam  Constitutional: He is oriented to person, place, and time. He appears well-developed. He appears cachectic.  Cardiovascular: Normal rate.   Respiratory: Effort normal. No accessory muscle usage. No tachypnea. No respiratory distress.  GI: Normal appearance.  Neurological: He is alert and oriented to person, place, and time.    Vital Signs: BP 95/59 mmHg  Pulse 92  Temp(Src) 99.1 F (37.3 C) (Oral)  Resp 16  Ht _0  (1.702 m)  Wt 68.493 kg (151 lb)  BMI 23.64 kg/m2  SpO2 100%  SpO2:  SpO2: 100 % O2 Device:SpO2: 100 % O2 Flow Rate: .   IO: Intake/output summary:  Intake/Output Summary (Last 24 hours) at 07/22/15 1606 Last data filed at 07/22/15 1300  Gross per 24 hour  Intake   2270 ml  Output      0 ml  Net   2270 ml    LBM: Last BM Date: 07/21/15 Baseline Weight: Weight: 49.3 kg (108 lb 11 oz) Most recent weight: Weight: 68.493 kg (151 lb)      Palliative Assessment/Data:  Flowsheet Rows        Most Recent Value   Intake Tab    Referral Department  Hospitalist   Unit at Time of Referral  Med/Surg Unit   Palliative Care Primary Diagnosis  Sepsis/Infectious Disease   Date Notified  07/22/15   Palliative Care Type  New Palliative care   Reason for referral  Clarify Goals of Care   Date of Admission  07/18/15   # of days IP prior to Palliative referral  4   Clinical Assessment    Psychosocial & Spiritual Assessment    Palliative Care Outcomes       Additional Data Reviewed:  CBC:    Component Value Date/Time   WBC 3.3* 07/22/2015 0955   HGB 10.9* 07/22/2015 0955   HCT 32.2* 07/22/2015 0955   PLT 55* 07/22/2015 0955   MCV 75.2* 07/22/2015 0955   NEUTROABS 2.6 07/20/2015 0439   LYMPHSABS 0.2* 07/20/2015 0439   MONOABS 0.1 07/20/2015 0439   EOSABS 0.0 07/20/2015 0439   BASOSABS 0.0 07/20/2015 0439   Comprehensive Metabolic Panel:    Component Value Date/Time   NA 142 07/22/2015 0955   K 2.4* 07/22/2015 0955   CL 116* 07/22/2015 0955   CO2 20* 07/22/2015 0955   BUN 21* 07/22/2015 0955   CREATININE 1.33* 07/22/2015 0955   GLUCOSE 62* 07/22/2015 0955   CALCIUM 7.0* 07/22/2015 0955   AST 36 07/22/2015 0955   ALT 16* 07/22/2015 0955   ALKPHOS 193* 07/22/2015 0955   BILITOT 1.0 07/22/2015 0955   PROT 4.1* 07/22/2015 0955   ALBUMIN 1.3* 07/22/2015 0955     Time In: 8299 Time Out: 1620 Time Total: 54mn Greater than 50%  of this time was spent counseling and coordinating care related to the above assessment and plan.  Signed  by:  Pershing Proud, NP  Pershing Proud, NP  28/00/3491, 4:06 PM  Please contact Palliative Medicine Team phone at [phone] for questions and concerns.

## 2015-07-22 NOTE — Progress Notes (Signed)
[place] [facility]ious [facility]    Subjective:   "I have no food on my stomach"   Antibiotics:  Anti-infectives    Start     Dose/Rate Route Frequency Ordered Stop   07/21/15 1800  fluconazole (DIFLUCAN) tablet 100 mg     100 mg Oral Daily 07/21/15 0955     07/21/15 1000  azithromycin (ZITHROMAX) tablet 600 mg     600 mg Oral Daily 07/21/15 0954     07/19/15 0400  vancomycin (VANCOCIN) IVPB 750 mg/150 ml premix  Status:  Discontinued     750 mg 150 mL/hr over 60 Minutes Intravenous Every 12 hours 07/18/15 1400 07/21/15 0954   07/18/15 2200  piperacillin-tazobactam (ZOSYN) IVPB 3.375 g     3.375 g 12.5 mL/hr over 240 Minutes Intravenous Every 8 hours 07/18/15 1400     07/18/15 2200  clarithromycin (BIAXIN) tablet 500 mg  Status:  Discontinued     500 mg Oral Every 12 hours 07/18/15 1735 07/21/15 0954   07/18/15 1830  rifabutin (MYCOBUTIN) capsule 300 mg  Status:  Discontinued     300 mg Oral Every 24 hours 07/18/15 1735 07/21/15 0954   07/18/15 1830  fluconazole (DIFLUCAN) IVPB 100 mg  Status:  Discontinued     100 mg 50 mL/hr over 60 Minutes Intravenous Every 24 hours 07/18/15 1735 07/21/15 0955   07/18/15 1745  ethambutol (MYAMBUTOL) tablet 800 mg     15 mg/kg  54 kg Oral Daily 07/18/15 1735     07/18/15 1700  dolutegravir (TIVICAY) tablet 50 mg     50 mg Oral Daily 07/18/15 1543     07/18/15 1700  emtricitabine-tenofovir AF (DESCOVY) 200-25 MG per tablet 1 tablet     1 tablet Oral Daily 07/18/15 1543     07/18/15 1700  azithromycin (ZITHROMAX) tablet 1,200 mg  Status:  Discontinued     1,200 mg Oral Weekly 07/18/15 1543 07/18/15 1620   07/18/15 1700  azithromycin (ZITHROMAX) tablet 600 mg  Status:  Discontinued     600 mg Oral Daily 07/18/15 1620 07/18/15 1735   07/18/15 1415  piperacillin-tazobactam (ZOSYN) IVPB 3.375 g     3.375 g 100 mL/hr over 30 Minutes Intravenous  Once 07/18/15 1400 07/18/15 1511   07/18/15 1400  vancomycin (VANCOCIN) IVPB  1000 mg/200 mL premix     1,000 mg 200 mL/hr over 60 Minutes Intravenous  Once 07/18/15 1400 07/19/15 0217   07/18/15 1330  sulfamethoxazole-trimethoprim (BACTRIM DS,SEPTRA DS) 800-160 MG per tablet 1 tablet     1 tablet Oral Daily 07/18/15 1326        Medications: Scheduled Meds: . antiseptic oral rinse  7 mL Mouth Rinse BID  . azithromycin  600 mg Oral Daily  . dolutegravir  50 mg Oral Daily  . emtricitabine-tenofovir AF  1 tablet Oral Daily  . ethambutol  15 mg/kg Oral Daily  . famotidine (PEPCID) IV  20 mg Intravenous Q24H  . feeding supplement  1 Container Oral TID WC  . feeding supplement (ENSURE ENLIVE)  237 mL Oral BID BM  . ferrous sulfate  325 mg Oral Daily  . fluconazole  100 mg Oral Daily  . folic acid  1 mg Oral Daily  . megestrol  400 mg Oral Daily  . nicotine  21 mg Transdermal Daily  . pantoprazole  40 mg Oral Daily  . piperacillin-tazobactam (ZOSYN)  IV  3.375 g Intravenous Q8H  . sodium chloride  3 mL Intravenous Q12H  . sulfamethoxazole-trimethoprim  1 tablet Oral Daily   Continuous Infusions: .  sodium bicarbonate 150 mEq in sterile water 1000 mL infusion 75 mL/hr at 07/22/15 0557   PRN Meds:.HYDROcodone-acetaminophen    Objective: Weight change:   Intake/Output Summary (Last 24 hours) at 07/22/15 1744 Last data filed at 07/22/15 1300  Gross per 24 hour  Intake   2170 ml  Output      0 ml  Net   2170 ml   Blood pressure 95/59, pulse 92, temperature 99.1 F (37.3 C), temperature source Oral, resp. rate 16, height 5\' 7"  (1.702 m), weight 151 lb (68.493 kg), SpO2 100 %. Temp:  [97.5 F (36.4 C)-99.1 F (37.3 C)] 99.1 F (37.3 C) (12/20 0949) Pulse Rate:  [79-92] 92 (12/20 0949) Resp:  [14-16] 16 (12/20 0949) BP: (95-106)/(59-73) 95/59 mmHg (12/20 0949) SpO2:  [96 %-100 %] 100 % (12/20 0949) Weight:  [151 lb (68.493 kg)] 151 lb (68.493 kg) (12/19 2059)  Physical Exam: General: Alert and awake, oriented x3, cachectic HEENT: anicteric sclera,,  EOMI, he has purplish lesions on palate ? KS CVS regular rate, normal r,  no murmur rubs or gallops Chest: clear to auscultation bilaterally, no wheezing, rales or rhonchi Abdomen: soft nontender, nondistended, normal bowel sounds, Extremities: no  clubbing or edema noted bilaterally Skin: ulcerated lesion on shin also ? KS Neuro: nonfocal  CBC:  CBC Latest Ref Rng 07/22/2015 07/20/2015 07/20/2015  WBC 4.0 - 10.5 K/uL 3.3(L) - 2.9(L)  Hemoglobin 13.0 - 17.0 g/dL 10.9(L) 9.1(L) 7.3(L)  Hematocrit 39.0 - 52.0 % 32.2(L) 27.5(L) 22.6(L)  Platelets 150 - 400 K/uL 55(L) - 78(L)      BMET  Recent Labs  07/20/15 0439 07/22/15 0955  NA 140 142  K 2.8* 2.4*  CL 120* 116*  CO2 13* 20*  GLUCOSE 62* 62*  BUN 28* 21*  CREATININE 1.40* 1.33*  CALCIUM 7.3* 7.0*     Liver Panel   Recent Labs  07/22/15 0955  PROT 4.1*  ALBUMIN 1.3*  AST 36  ALT 16*  ALKPHOS 193*  BILITOT 1.0       Sedimentation Rate No results for input(s): ESRSEDRATE in the last 72 hours. C-Reactive Protein No results for input(s): CRP in the last 72 hours.  Micro Results: Recent Results (from the past 720 hour(s))  Culture, blood (Routine X 2) w Reflex to ID Panel     Status: None (Preliminary result)   Collection Time: 07/18/15 11:30 AM  Result Value Ref Range Status   Specimen Description BLOOD RIGHT ANTECUBITAL  Final   Special Requests BOTTLES DRAWN AEROBIC ONLY 5CC  Final   Culture NO GROWTH 4 DAYS  Final   Report Status PENDING  Incomplete  Culture, blood (Routine X 2) w Reflex to ID Panel     Status: None (Preliminary result)   Collection Time: 07/18/15 11:45 AM  Result Value Ref Range Status   Specimen Description BLOOD LEFT ANTECUBITAL  Final   Special Requests BOTTLES DRAWN AEROBIC ONLY 5CC  Final   Culture NO GROWTH 4 DAYS  Final   Report Status PENDING  Incomplete  Gastrointestinal Panel by PCR , Stool     Status: None   Collection Time: 07/18/15  3:44 PM  Result Value Ref Range  Status   Campylobacter species NOT DETECTED NOT DETECTED Final   Plesimonas shigelloides NOT DETECTED NOT DETECTED Final   Salmonella species NOT DETECTED NOT DETECTED Final   Yersinia enterocolitica NOT DETECTED  NOT DETECTED Final   Vibrio species NOT DETECTED NOT DETECTED Final   Vibrio cholerae NOT DETECTED NOT DETECTED Final   Enteroaggregative E coli (EAEC) NOT DETECTED NOT DETECTED Final   Enteropathogenic E coli (EPEC) NOT DETECTED NOT DETECTED Final   Enterotoxigenic E coli (ETEC) NOT DETECTED NOT DETECTED Final   Shiga like toxin producing E coli (STEC) NOT DETECTED NOT DETECTED Final   E. coli O157 NOT DETECTED NOT DETECTED Final   Shigella/Enteroinvasive E coli (EIEC) NOT DETECTED NOT DETECTED Final   Cryptosporidium NOT DETECTED NOT DETECTED Final   Cyclospora cayetanensis NOT DETECTED NOT DETECTED Final   Entamoeba histolytica NOT DETECTED NOT DETECTED Final   Giardia lamblia NOT DETECTED NOT DETECTED Final   Adenovirus F40/41 NOT DETECTED NOT DETECTED Final   Astrovirus NOT DETECTED NOT DETECTED Final   Norovirus GI/GII NOT DETECTED NOT DETECTED Final   Rotavirus A NOT DETECTED NOT DETECTED Final   Sapovirus (I, II, IV, and V) NOT DETECTED NOT DETECTED Final  AFB culture with smear     Status: None (Preliminary result)   Collection Time: 07/18/15  3:44 PM  Result Value Ref Range Status   Specimen Description STOOL  Final   Special Requests Immunocompromised  Final   Acid Fast Smear   Final    7183 3+ ACID FAST BACILLI SEEN CRITICAL RESULT CALLED TO, READ BACK BY AND VERIFIED WITH: ALBERT 14:45 08/18/14 [place] FAXED TO 832 Performed at [facility]   Final    CULTURE WILL BE EXAMINED FOR 6 WEEKS BEFORE ISSUING A FINAL REPORT Performed at [facility]    Report Status PENDING  Incomplete  C difficile quick scan w PCR reflex     Status: None   Collection Time: 07/19/15  1:43 PM  Result Value Ref Range Status   C Diff antigen NEGATIVE NEGATIVE  Final   C Diff toxin NEGATIVE NEGATIVE Final   C Diff interpretation Negative for toxigenic C. difficile  Final    Studies/Results: No results found.    Assessment/Plan:  INTERVAL HISTORY: patient started on M avium therapy   Principal Problem:   HCAP (healthcare-associated pneumonia) Active Problems:   AIDS ([place])   Tobacco abuse disorder   Arterial hypotension   Anemia due to bone marrow failure (HCC)   Abdominal pain, chronic, epigastric   HIV disease (HCC)   Chronic combined systolic and diastolic congestive heart failure (HCC)   Protein-calorie malnutrition (HCC)   Cardiomyopathy (HCC)   Weight loss   Diarrhea   UTI (urinary tract infection)   Oral thrush   Dehydration   Orthostatic hypotension   Cachexia ([place])   Kaposi's disease    Miguel Campos is a 44 y.o. male with  AIDS, thrush, possible KS on skin and in oropharynx, dysphagia, pancytopenia, disseminated M avium, failure to thrive  #1 HIV/AIDS: continue Tivicay and Descovy,   Continue bactrim for OI prophylaxis  #2 M avium: DC rifabutin duet to problems with TAF. WIll change from biaxin to Azithromycin for simplicity in dosing, and continue ETH  #3 Thrush: continue fluconazole change to orals  #4 Possible KS in OP and skin:  Would ask [facility] for punch biopsy of his right shin and to send to pathology  Would consider ask  GI if they would consider EGD for visual inspection of his esophagus for possible KS  IF any lesions appear c/w KS, WOULD NOT recommend GI  BIOPSY THEM GIVEN HIGH RISK OF BLEEDING FROM THEM  IN ESOPHAGUS  HE ALSO NEEDS BIOPSY OF HIS PALATE, would ask ENT to see him for biopsy  Continue ARV   #5 Noncompliance: claims there is obstacle to obtaining meds. I called Sharyn Lull with [facility] and either she or bridge counselor and I believe Sharyn Lull herself came yesterday. Not sure if he will need ADAP soon when insurance expires  #6 ? HCAP: dc vancomycin, observe on zosyn alone  #7 FTT: I think  he needs either a SNF or [facility] for rehab--the latter ONLY if they will allow him to take his ARVS, antifungals, m avium drugs. Finally if he ends up needing doxil for KS they would not likely accomodate him  #8 Refusing meds noncompliance: I wonder if he has a signficant degree of HIV related dementia and perhaps he may need to be in SNF or structured environment to improve. Consider psych consult for competency and agree also with palliative care consult. His sister seems like strong advocate    LOS: 4 days   Alcide Evener 07/22/2015, 5:44 PM

## 2015-07-22 NOTE — Progress Notes (Signed)
Attempted to administer pt's morning meds, but pt states "I just can't take any pills right now." Pt request that I come back later. Will attempt again later to administer missed medications.

## 2015-07-22 NOTE — Evaluation (Signed)
Physical Therapy Evaluation Patient Details Name: Miguel Campos MRN: [id number] DOB: 1970-09-25 Today's Date: 07/22/2015   History of Present Illness  44 yo male admitted PNA, oral thrush, UTI, diarrhea PMH: HTN, AIDS, noncompliant with medications, CHF  Clinical Impression  Pt admitted with above. Pt presenting LE edema, generalized weakness, quick onset of fatigue, and decreased activity tolerance. Pt also self-limiting however was responsive this date to encouragement to participate in transfering to chair and LE there ex and was appreciative of services. Pt reports "that was easier than I thought" in regard to std pvt transfer to chair. Pt con't to refuse ambulation this date. Acute PT to con't to follow to progress mobility as able.    Follow Up Recommendations Home health PT;Supervision/Assistance - 24 hour    Equipment Recommendations  None recommended by PT    Recommendations for Other Services       Precautions / Restrictions Precautions Precautions: Fall Restrictions Weight Bearing Restrictions: No      Mobility  Bed Mobility Overal bed mobility: Needs Assistance Bed Mobility: Rolling;Sidelying to Sit Rolling: Min assist Sidelying to sit: Min assist       General bed mobility comments: minA to complete rolling and minA for trunk elevation  Transfers Overall transfer level: Needs assistance Equipment used: 1 person hand held assist Transfers: Sit to/from [facility] Sit to Stand: Min assist Stand pivot transfers: Min assist       General transfer comment: v/c's for hand placement, minA for initial anterior weight shift  Ambulation/Gait             General Gait Details: pt refused, "I can't do too much now" My stomach is starting to hurt  Stairs            Wheelchair Mobility    Modified Rankin (Stroke Patients Only)       Balance                                             Pertinent Vitals/Pain Pain  Assessment: Faces Faces Pain Scale: Hurts little more Pain Location: abdominal nausea/pain Pain Intervention(s): Monitored during session    Home Living Family/patient expects to be discharged to:: Private residence Living Arrangements: Parent Available Help at Discharge: Family;Available 24 hours/day Type of Home: House Home Access: Stairs to enter Entrance Stairs-Rails: Left Entrance Stairs-Number of Steps: 3 Home Layout: Multi-level;Bed/bath upstairs (normally stays in bedroom) Home Equipment: None      Prior Function Level of Independence: Needs assistance   Gait / Transfers Assistance Needed: (A) hand held with mother to all transfers  ADL's / Homemaking Assistance Needed: (A) with all adls. 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   Dominant Hand: Right    Extremity/Trunk Assessment   Upper Extremity Assessment: Defer to OT evaluation           Lower Extremity Assessment: RLE deficits/detail;LLE deficits/detail RLE Deficits / Details: grossly 3/5 LLE Deficits / Details: grossly 3/5  Cervical / Trunk Assessment: Normal  Communication   Communication: No difficulties  Cognition Arousal/Alertness: Awake/alert Behavior During Therapy: WFL for tasks assessed/performed Overall Cognitive Status: Within Functional Limits for tasks assessed                      General Comments  Exercises General Exercises - Lower Extremity Ankle Circles/Pumps: AROM;Both;10 reps;Seated Quad Sets: AROM;Both;10 reps;Seated (with 5 sec hold) Gluteal Sets: AROM;Left;10 reps;Seated Long Arc Quad: Both;10 reps;Seated;AAROM      Assessment/Plan    PT Assessment Patient needs continued PT services  PT Diagnosis Difficulty walking;Generalized weakness   PT Problem List Decreased strength;Decreased range of motion;Decreased activity tolerance;Decreased balance;Decreased mobility  PT Treatment  Interventions DME instruction;Gait training;Stair training;Functional mobility training;Therapeutic activities;Therapeutic exercise;Balance training   PT Goals (Current goals can be found in the Care Plan section) Acute Rehab PT Goals Patient Stated Goal: to ge back to walking PT Goal Formulation: With patient Time For Goal Achievement: 07/29/15 Potential to Achieve Goals: Good    Frequency Min 3X/week   Barriers to discharge        Co-evaluation               End of Session Equipment Utilized During Treatment: Gait belt Activity Tolerance: Patient limited by fatigue Patient left: in chair;with call bell/phone within reach;with nursing/sitter in room Nurse Communication: Mobility status         Time: [id number] PT Time Calculation (min) (ACUTE ONLY): 44 min   Charges:   PT Evaluation $Initial PT Evaluation Tier I: 1 Procedure PT Treatments $Therapeutic Exercise: 8-22 mins $Therapeutic Activity: 8-22 mins   PT G CodesKingsley Campos 07/22/2015, 9:49 AM   Kittie Plater, PT, DPT Pager #: [phone] Office #: [phone]

## 2015-07-23 DIAGNOSIS — K2951 Unspecified chronic gastritis with bleeding: Secondary | ICD-10-CM

## 2015-07-23 DIAGNOSIS — I5042 Chronic combined systolic (congestive) and diastolic (congestive) heart failure: Secondary | ICD-10-CM

## 2015-07-23 DIAGNOSIS — Z720 Tobacco use: Secondary | ICD-10-CM

## 2015-07-23 DIAGNOSIS — F0280 Dementia in other diseases classified elsewhere without behavioral disturbance: Secondary | ICD-10-CM

## 2015-07-23 LAB — BASIC METABOLIC PANEL
ANION GAP: 7 (ref 5–15)
BUN: 22 mg/dL — AB (ref 6–20)
CO2: 21 mmol/L — AB (ref 22–32)
Calcium: 6.8 mg/dL — ABNORMAL LOW (ref 8.9–10.3)
Chloride: 113 mmol/L — ABNORMAL HIGH (ref 101–111)
Creatinine, Ser: 1.34 mg/dL — ABNORMAL HIGH (ref 0.61–1.24)
GFR calc Af Amer: >60 mL/min (ref >60)
GLUCOSE: 81 mg/dL (ref 65–99)
POTASSIUM: 3 mmol/L — AB (ref 3.5–5.1)
Sodium: 141 mmol/L (ref 135–145)

## 2015-07-23 LAB — CULTURE, BLOOD (ROUTINE X 2)
CULTURE: NO GROWTH
CULTURE: NO GROWTH

## 2015-07-23 LAB — CBC
HEMATOCRIT: 27.9 % — AB (ref 39.0–52.0)
Hemoglobin: 9.4 g/dL — ABNORMAL LOW (ref 13.0–17.0)
MCH: 25.3 pg — AB (ref 26.0–34.0)
MCHC: 33.7 g/dL (ref 30.0–36.0)
MCV: 75.2 fL — AB (ref 78.0–100.0)
PLATELETS: 42 10*3/uL — AB (ref 150–400)
RBC: 3.71 MIL/uL — AB (ref 4.22–5.81)
RDW: 18.5 % — AB (ref 11.5–15.5)
WBC: 2.8 10*3/uL — ABNORMAL LOW (ref 4.0–10.5)

## 2015-07-23 LAB — MAGNESIUM: Magnesium: 2.2 mg/dL (ref 1.7–2.4)

## 2015-07-23 MED ORDER — POTASSIUM CHLORIDE 10 MEQ/100ML IV SOLN
INTRAVENOUS | Status: AC
  Filled 2015-07-23: qty 100

## 2015-07-23 MED ORDER — POTASSIUM CHLORIDE 10 MEQ/100ML IV SOLN
10.0000 meq | INTRAVENOUS | Status: AC
  Administered 2015-07-23 (×2): 10 meq via INTRAVENOUS
  Filled 2015-07-23 (×2): qty 100

## 2015-07-23 MED ORDER — POTASSIUM CHLORIDE 10 MEQ/100ML IV SOLN
10.0000 meq | Freq: Once | INTRAVENOUS | Status: AC
  Administered 2015-07-23: 10 meq via INTRAVENOUS
  Filled 2015-07-23: qty 100

## 2015-07-23 NOTE — Progress Notes (Signed)
[facility] PROGRESS NOTE  Miguel Campos [id number] DOB: October 25, 1970 DOA: 07/18/2015 PCP: Isaias Cowman, PA-C  Assessment/Plan: #1 healthcare associated pneumonia Per chest x-ray and clinical symptoms. Patient states some clinical improvement. Patient also with hypotension. Patient has been pancultured results pending. Patient is afebrile. Patient with a leukopenia with white count of 2.9. Continue empiric IV Zosyn. ID following.  #2 hypotension Likely secondary to hypovolemia and infectious etiology. Patient mentating well. Systolic blood pressures in the mid-high 90s. IV fluids. Also given patient IV albumin. Patient noted to have a hemoglobin of 7.3 on 07/20/2015. Patient is status post 2 units packed red blood cells with improvement with hypotension. Follow H&H. Follow.  #3 oral thrush Continue Diflucan.  #4 MAI Continue azithromycin, ethambutol per ID recommendations.Rifabutin has been d/c'd per ID.  #5 AIDS Patient's CD4 less than 10 on 06/20/2015. Viral load is 71700 from 06/20/2015. Genotype pending. Continue ART therapy per ID. ID following and appreciate input and recommendations.  #6 severe protein calorie malnutrition Patient has been started on Megace. Nutritional supplementation.  #7 diarrhea Improved. Stool studies negative to date. GI panel negative. C. difficile PCR negative. Stool ova and parasites pending. AFB culture with smear on stool positive. Patient on azithromycin, rifampin, ethambutol for MAI per ID. ID following.  #8 history of CHF/cardiomyopathy Prior 2-D echo with EF of 30-35%. Patient dehydrated and hypovolemic. Continue IV fluids. Beta blocker on hold secondary to hypotension. Follow.  #9 pancytopenia Likely secondary to AIDS. Patient with a white count of 2.9, hemoglobin of 7.3, platelet count of 78 on 07/20/2015.Marland Kitchen Patient denies any overt bleeding. Continue empiric IV antibiotics. S/P 2 units of packed red blood cells secondary to drop his  hemoglobin and his hypotension. Hemoglobin currently at 10.9.  Follow.  #10 UTI  Continue IV Zosyn.  #11 hypokalemia/hypomagnesemia Likely secondary to GI losses. Replete.  #12 prognosis Patient with a poor prognosis. Patient with AIDS and has been noncompliant with his medications. Patient presented for the second time to this hospital over the past 2 months and has been admitted 6 times over the past 2 months. Patient with failure to thrive. Patient is cachectic and deteriorating.    Code Status: Full Family Communication: Updated patient and family at bedside. Disposition Plan: Skilled nursing facility when medically stable.   Consultants:  Infectious diseases: Dr. Johnnye Sima 07/18/2015  Procedures:  Chest x-ray 07/18/2015  Abdominal films 07/19/2015  2 units packed red blood cells 07/20/2015  Antibiotics:  Oral clarithromycin 07/18/2015  Ethambutol 07/18/2015  IV Diflucan 07/18/2015  IV Zosyn 07/18/2015  IV vancomycin 07/18/2015>>>>07/21/2015  Oral Bactrim 07/18/2015  Rifabutin 07/18/2015  HPI/Subjective: Patient more alert and following commands. Denies chest pain. Denies shortness of breath. DISCUSSED with mom at bedside.   Objective: Filed Vitals:   07/23/15 0907 07/23/15 1802  BP: 89/51 93/62  Pulse: 74 73  Temp: 97.4 F (36.3 C) 97.6 F (36.4 C)  Resp: 14 16    Intake/Output Summary (Last 24 hours) at 07/23/15 1830 Last data filed at 07/23/15 1803  Gross per 24 hour  Intake    600 ml  Output      0 ml  Net    600 ml   Filed Weights   07/19/15 2147 07/21/15 2059 07/22/15 2011  Weight: 49 kg (108 lb 0.4 oz) 68.493 kg (151 lb) 52.617 kg (116 lb)    Exam:   General:  NAD. Cachetic. Frail  Cardiovascular: Regular rate rhythm  Respiratory: Clear to auscultation bilaterally anterior lung fields.  Abdomen: Soft,  nontender, nondistended, positive bowel sounds.  Musculoskeletal: No clubbing or cyanosis. Bilateral pedal edema.  Data  Reviewed: Basic Metabolic Panel:  Recent Labs Lab 07/18/15 1110 07/19/15 0945 07/20/15 0439 07/22/15 0955 07/23/15 1110  NA 138 139 140 142 141  K 3.8 3.1* 2.8* 2.4* 3.0*  CL 113* 115* 120* 116* 113*  CO2 17* 15* 13* 20* 21*  GLUCOSE 67 79 62* 62* 81  BUN 42* 41* 28* 21* 22*  CREATININE 1.19 1.34* 1.40* 1.33* 1.34*  CALCIUM 7.5* 7.0* 7.3* 7.0* 6.8*  MG  --  1.6* 2.5* 1.8 2.2   Liver Function Tests:  Recent Labs Lab 07/18/15 1110 07/22/15 0955  AST 61* 36  ALT 25 16*  ALKPHOS 295* 193*  BILITOT 0.6 1.0  PROT 3.9* 4.1*  ALBUMIN <1.0* 1.3*    Recent Labs Lab 07/18/15 1110  LIPASE 30   No results for input(s): AMMONIA in the last 168 hours. CBC:  Recent Labs Lab 07/18/15 1110 07/19/15 0945 07/20/15 0439 07/20/15 2303 07/22/15 0955 07/23/15 1110  WBC 3.6* 2.7* 2.9*  --  3.3* 2.8*  NEUTROABS 3.1  --  2.6  --   --   --   HGB 9.9* 9.4* 7.3* 9.1* 10.9* 9.4*  HCT 30.8* 30.1* 22.6* 27.5* 32.2* 27.9*  MCV 77.2* 76.8* 74.8*  --  75.2* 75.2*  PLT 111* 82* 78*  --  55* 42*   Cardiac Enzymes: No results for input(s): CKTOTAL, CKMB, CKMBINDEX, TROPONINI in the last 168 hours. BNP (last 3 results)  Recent Labs  06/20/15 2002  BNP 243.8*    ProBNP (last 3 results) No results for input(s): PROBNP in the last 8760 hours.  CBG:  Recent Labs Lab 07/18/15 1108 07/18/15 1132  GLUCAP 43* 137*    Recent Results (from the past 240 hour(s))  Culture, blood (Routine X 2) w Reflex to ID Panel     Status: None   Collection Time: 07/18/15 11:30 AM  Result Value Ref Range Status   Specimen Description BLOOD RIGHT ANTECUBITAL  Final   Special Requests BOTTLES DRAWN AEROBIC ONLY 5CC  Final   Culture NO GROWTH 5 DAYS  Final   Report Status 07/23/2015 FINAL  Final  Culture, blood (Routine X 2) w Reflex to ID Panel     Status: None   Collection Time: 07/18/15 11:45 AM  Result Value Ref Range Status   Specimen Description BLOOD LEFT ANTECUBITAL  Final   Special  Requests BOTTLES DRAWN AEROBIC ONLY 5CC  Final   Culture NO GROWTH 5 DAYS  Final   Report Status 07/23/2015 FINAL  Final  Gastrointestinal Panel by PCR , Stool     Status: None   Collection Time: 07/18/15  3:44 PM  Result Value Ref Range Status   Campylobacter species NOT DETECTED NOT DETECTED Final   Plesimonas shigelloides NOT DETECTED NOT DETECTED Final   Salmonella species NOT DETECTED NOT DETECTED Final   Yersinia enterocolitica NOT DETECTED NOT DETECTED Final   Vibrio species NOT DETECTED NOT DETECTED Final   Vibrio cholerae NOT DETECTED NOT DETECTED Final   Enteroaggregative E coli (EAEC) NOT DETECTED NOT DETECTED Final   Enteropathogenic E coli (EPEC) NOT DETECTED NOT DETECTED Final   Enterotoxigenic E coli (ETEC) NOT DETECTED NOT DETECTED Final   Shiga like toxin producing E coli (STEC) NOT DETECTED NOT DETECTED Final   E. coli O157 NOT DETECTED NOT DETECTED Final   Shigella/Enteroinvasive E coli (EIEC) NOT DETECTED NOT DETECTED Final   Cryptosporidium NOT DETECTED NOT  DETECTED Final   Cyclospora cayetanensis NOT DETECTED NOT DETECTED Final   Entamoeba histolytica NOT DETECTED NOT DETECTED Final   Giardia lamblia NOT DETECTED NOT DETECTED Final   Adenovirus F40/41 NOT DETECTED NOT DETECTED Final   Astrovirus NOT DETECTED NOT DETECTED Final   Norovirus GI/GII NOT DETECTED NOT DETECTED Final   Rotavirus A NOT DETECTED NOT DETECTED Final   Sapovirus (I, II, IV, and V) NOT DETECTED NOT DETECTED Final  AFB culture with smear     Status: None (Preliminary result)   Collection Time: 07/18/15  3:44 PM  Result Value Ref Range Status   Specimen Description STOOL  Final   Special Requests Immunocompromised  Final   Acid Fast Smear   Final    7183 3+ ACID FAST BACILLI SEEN CRITICAL RESULT CALLED TO, READ BACK BY AND VERIFIED WITH: ALBERT 14:45 08/18/14 [place] FAXED TO 832 Performed at [facility]    Culture   Final    CULTURE WILL BE EXAMINED FOR 6 WEEKS BEFORE ISSUING A  FINAL REPORT Performed at [facility]    Report Status PENDING  Incomplete  C difficile quick scan w PCR reflex     Status: None   Collection Time: 07/19/15  1:43 PM  Result Value Ref Range Status   C Diff antigen NEGATIVE NEGATIVE Final   C Diff toxin NEGATIVE NEGATIVE Final   C Diff interpretation Negative for toxigenic C. difficile  Final     Studies: No results found.  Scheduled Meds: . antiseptic oral rinse  7 mL Mouth Rinse BID  . azithromycin  600 mg Oral Daily  . dolutegravir  50 mg Oral Daily  . emtricitabine-tenofovir AF  1 tablet Oral Daily  . ethambutol  15 mg/kg Oral Daily  . famotidine (PEPCID) IV  20 mg Intravenous Q24H  . feeding supplement  1 Container Oral TID WC  . feeding supplement (ENSURE ENLIVE)  237 mL Oral BID BM  . ferrous sulfate  325 mg Oral Daily  . fluconazole  100 mg Oral Daily  . folic acid  1 mg Oral Daily  . megestrol  400 mg Oral Daily  . nicotine  21 mg Transdermal Daily  . pantoprazole  40 mg Oral Daily  . piperacillin-tazobactam (ZOSYN)  IV  3.375 g Intravenous Q8H  . potassium chloride  10 mEq Intravenous Q1 Hr x 3  . potassium chloride  10 mEq Intravenous Once  . sodium chloride  3 mL Intravenous Q12H  . sulfamethoxazole-trimethoprim  1 tablet Oral Daily   Continuous Infusions: .  sodium bicarbonate 150 mEq in sterile water 1000 mL infusion 75 mL/hr at 07/22/15 2228    Principal Problem:   HCAP (healthcare-associated pneumonia) Active Problems:   AIDS ([place])   Tobacco abuse disorder   Arterial hypotension   Anemia due to bone marrow failure (HCC)   Abdominal pain, chronic, epigastric   HIV disease (HCC)   Chronic combined systolic and diastolic congestive heart failure (HCC)   Protein-calorie malnutrition (HCC)   Cardiomyopathy ([place])   Weight loss   Diarrhea   UTI (urinary tract infection)   Oral thrush   Dehydration   Orthostatic hypotension   Cachexia ([place])   Kaposi's disease   Mycobacterial infection,  non-TB   Palliative care encounter   Hypokalemia    Time spent: 25 mins    Tracie Dore MD [facility] Pager [phone]. If 7PM-7AM, please contact night-coverage at [email or web address], password [facility] 07/23/2015, 6:30 PM  LOS: 5 days

## 2015-07-23 NOTE — Progress Notes (Signed)
Nutrition Follow-up  DOCUMENTATION CODES:   Severe malnutrition in context of chronic illness  INTERVENTION:  Provide nourishment snacks. (Ordered).  Continue Ensure Enlive po BID, each supplement provides 350 kcal and 20 grams of protein.  Continue Boost Breeze po TID, each supplement provides 250 kcal and 9 grams of protein.  Encourage adequate PO intake.  NUTRITION DIAGNOSIS:   Malnutrition related to chronic illness as evidenced by severe depletion of body fat, moderate depletions of muscle mass, percent weight loss; ongoing  GOAL:   Patient will meet greater than or equal to 90% of their needs; not met  MONITOR:   PO intake, Supplement acceptance, Diet advancement, I & O's, Labs, Weight trends  REASON FOR ASSESSMENT:   Malnutrition Screening Tool    ASSESSMENT:   Miguel Campos is a 44 y.o. male PMH of HIV/AIDS, non adherent to treatment, recently admitted for PCP pneumonia (06/2014) discharged on HAART, TPM-SMX, Azithromycin (but did not take meds after discharge) presented with nausea, recurrent diarrhea, progressive generalized weakness, tiredness. He also reports fevers, chills, DOE, cough, SOB, but denies chest pains. Patient is not able to walk well, recurring assistance from his family   Meal completion has been 10-25%. Pt reports having an appetite, however has been having early satiety at meals thus unable to eat his food at meals. Pt was educated/encouraged to consume multiple small meals a day to avoid early satiety and to help provide adequate nutrition needs. RD agreeable to nourishment snacks between meals. RD to order. Pt currently has Ensure and Boost Breeze ordered. Will continue with current orders. Pt was encouraged to eat his food at meals and to drink his supplements.   Labs and medications reviewed.   Diet Order:  DIET SOFT Room service appropriate?: Yes; Fluid consistency:: Thin  Skin:  Reviewed, no issues  Last BM:  12/20  Height:   Ht  Readings from Last 1 Encounters:  07/20/15 5' 7"  (1.702 m)    Weight:   Wt Readings from Last 1 Encounters:  07/22/15 116 lb (52.617 kg)    Ideal Body Weight:  67.2 kg  BMI:  Body mass index is 18.16 kg/(m^2).  Estimated Nutritional Needs:   Kcal:  1700-1900  Protein:  85-100 grams  Fluid:  > 1.6 L/day  EDUCATION NEEDS:   No education needs identified at this time  Corrin Parker, MS, RD, LDN Pager # [phone] After hours/ weekend pager # [phone]

## 2015-07-23 NOTE — NC FL2 (Signed)
[place] MEDICAID FL2 LEVEL OF CARE SCREENING TOOL     IDENTIFICATION  Patient Name: Miguel Campos Birthdate: 10-Oct-1970 Sex: male Admission Date (Current Location): 07/18/2015  [facility] and [place] Number:  Herbalist and Address:  [facility] [place]. [facility] [place] [address] [place], [place] [address]      Provider Number:    Attending Physician Name and Address:  Hosie Poisson, MD  Relative Name and Phone Number:  Rayne Du - mother [phone]    Current Level of Care:   Recommended Level of Care:   Prior Approval Number:    Date Approved/Denied:   PASRR Number: [id number] (Effective 07/23/15)  Discharge Plan: SNF    Current Diagnoses: Patient Active Problem List   Diagnosis Date Noted  . Palliative care encounter 07/22/2015  . Mycobacterial infection, non-TB   . Hypokalemia   . Cachexia ([place])   . Kaposi's disease   . Orthostatic hypotension   . UTI (urinary tract infection) 07/19/2015  . Oral thrush 07/19/2015  . Dehydration   . Diarrhea 07/18/2015  . HCAP (healthcare-associated pneumonia) 07/18/2015  . Cardiomyopathy ([place]) 07/08/2015  . Weight loss 07/08/2015  . Chronic combined systolic and diastolic congestive heart failure ([place]) 06/23/2015  . Protein-calorie malnutrition ([place]) 06/23/2015  . HIV disease ([place])   . Lactic acidosis 06/21/2015  . Arterial hypotension   . 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 CAP (community acquired pneumonia)   . Anemia due to bone marrow failure ([place])   . Absolute anemia   . Diarrhea of presumed infectious origin   . Abdominal pain, chronic, epigastric   . SVT (supraventricular tachycardia) ([place]) 06/20/2015  . Atypical pneumonia 06/20/2015  . AIDS ([place]) 06/20/2015  . Tobacco abuse disorder 06/20/2015  . Hypomagnesemia 06/20/2015    Orientation RESPIRATION BLADDER Height & Weight    Self, Time, Situation, Place  Normal Continent   116 lbs.  BEHAVIORAL  SYMPTOMS/MOOD NEUROLOGICAL BOWEL NUTRITION STATUS      Continent Diet (Soft diet with thin liquids)  AMBULATORY STATUS COMMUNICATION OF NEEDS Skin   Extensive Assist (Patient refused to ambulate during PT evaluation on 07/22/15) Verbally Normal                       Personal Care Assistance Level of Assistance  Bathing, Feeding, Dressing Bathing Assistance: Maximum assistance Feeding assistance: Independent Dressing Assistance: Limited assistance     Functional Limitations Info             [place]  PT (By licensed PT), OT (By licensed OT), Restorative feeding program     PT Frequency: Evaluated 12/20 - minimum of 3x per week therapy recommended OT Frequency: Evaluated 12/19 - minimum of 2x per week therapy recommended   Restorative Feeding Program Frequency: Followed by registered dietian - Severe malnutrition in context of chronic illness        Contractures Contractures Info: Not present    Additional Factors Info  Code Status, Allergies Code Status Info: Full Code Allergies Info: No known allergies           Current Medications (07/23/2015):  This is the current hospital active medication list Current Facility-Administered Medications  Medication Dose Route Frequency Provider Last Rate Last Dose  . antiseptic oral rinse (CPC / CETYLPYRIDINIUM CHLORIDE 0.05%) solution 7 mL  7 mL Mouth Rinse BID Kinnie Feil, MD   7 mL at 07/23/15 0955  . azithromycin (  ZITHROMAX) tablet 600 mg  600 mg Oral Daily Truman Hayward, MD   600 mg at 07/23/15 1244  . dolutegravir (TIVICAY) tablet 50 mg  50 mg Oral Daily Kinnie Feil, MD   50 mg at 07/23/15 0955  . emtricitabine-tenofovir AF (DESCOVY) 200-25 MG per tablet 1 tablet  1 tablet Oral Daily Kinnie Feil, MD   1 tablet at 07/23/15 0955  . ethambutol (MYAMBUTOL) tablet 800 mg  15 mg/kg Oral Daily Campbell Riches, MD   800 mg at 07/23/15 1443  . famotidine (PEPCID) IVPB 20 mg premix  20  mg Intravenous Q24H Eugenie Filler, MD   20 mg at 07/22/15 1036  . feeding supplement (BOOST / RESOURCE BREEZE) liquid 1 Container  1 Container Oral TID WC Asencion Islam, RD   1 Container at 07/20/15 0800  . feeding supplement (ENSURE ENLIVE) (ENSURE ENLIVE) liquid 237 mL  237 mL Oral BID BM Asencion Islam, RD   237 mL at 07/19/15 1423  . ferrous sulfate tablet 325 mg  325 mg Oral Daily Kinnie Feil, MD   325 mg at 07/22/15 1619  . fluconazole (DIFLUCAN) tablet 100 mg  100 mg Oral Daily Truman Hayward, MD   100 mg at 07/22/15 2228  . folic acid (FOLVITE) tablet 1 mg  1 mg Oral Daily Kinnie Feil, MD   1 mg at 07/22/15 1425  . HYDROcodone-acetaminophen (NORCO/VICODIN) 5-325 MG per tablet 1 tablet  1 tablet Oral Q6H PRN Kinnie Feil, MD   1 tablet at 07/23/15 1739  . megestrol (MEGACE) 400 MG/10ML suspension 400 mg  400 mg Oral Daily Campbell Riches, MD   400 mg at 07/21/15 1034  . nicotine (NICODERM CQ - dosed in mg/24 hours) patch 21 mg  21 mg Transdermal Daily Ritta Slot, NP   21 mg at 07/23/15 0957  . pantoprazole (PROTONIX) EC tablet 40 mg  40 mg Oral Daily Kinnie Feil, MD   40 mg at 07/21/15 1034  . piperacillin-tazobactam (ZOSYN) IVPB 3.375 g  3.375 g Intravenous Q8H Eudelia Bunch, RPH   3.375 g at 07/23/15 1443  . potassium chloride 10 mEq in 100 mL IVPB  10 mEq Intravenous Q1 Hr x 3 Hosie Poisson, MD   10 mEq at 07/23/15 1706  . potassium chloride 10 mEq in 100 mL IVPB  10 mEq Intravenous Once Hosie Poisson, MD      . sodium bicarbonate 150 mEq in sterile water 1,000 mL infusion   Intravenous Continuous Eugenie Filler, MD 75 mL/hr at 07/22/15 2228    . sodium chloride 0.9 % injection 3 mL  3 mL Intravenous Q12H Kinnie Feil, MD   3 mL at 07/23/15 0956  . sulfamethoxazole-trimethoprim (BACTRIM DS,SEPTRA DS) 800-160 MG per tablet 1 tablet  1 tablet Oral Daily Eudelia Bunch, [facility]   1 tablet at 07/23/15 [id number]     Discharge Medications: Please see  discharge summary for a list of discharge medications.  Relevant Imaging Results:  Relevant Lab Results:   Additional Information    Sharlet [place], Mila Homer, LCSW

## 2015-07-23 NOTE — Progress Notes (Signed)
Patient complained of Potassium pills being too difficult to swallow. Patient was able to take 2 and half tablets out of whole  6 tablets prescribed during night shift.

## 2015-07-23 NOTE — Progress Notes (Signed)
I went to visit with Miguel Campos again and his mother is at bedside. I asked if I could mute the television while we talked and he tells me "we talked yesterday, what is there to talk about?" Before I could answer his mother said that she would like to talk with me and he says that this is okay. He is complaining about IV potassium burning - spoke with RN about infusing with his maintenance fluids to further dilute the potassium. They are also desiring EGD to assess his nausea/diarrhea/fullness. His mother seems very understanding and desiring for Miguel Campos to be comfortable. We did not further discuss his wishes as he clearly did not want to discuss but I did give his mother a Hard Choices booklet and my card and encouraged them to consider conversations about his wishes at least among themselves.   No Charge  Vinie Sill, NP Palliative Medicine Team Pager # [phone] (M-F 8a-5p) Team Phone # [phone] (Nights/Weekends)

## 2015-07-23 NOTE — Progress Notes (Signed)
Discussed with pt and Dr. Grandville Silos yesterday (07/22/2015) with possibility of administering daily medications at different times. Per Dr. Grandville Silos it is okay to split the meds up so that the number of pills at a time is about 3-4. Pt agreed with the plan and stated he would work with staff to cooperate. Will attempt to follow through with this plan today.

## 2015-07-23 NOTE — Progress Notes (Signed)
OT Cancellation Note  Patient Details Name: Miguel Campos MRN: [id number] DOB: August 16, 1970   Cancelled Treatment:    Reason Eval/Treat Not Completed: Patient declined, no reason specified. Pt states "you always come when I am not wanting to do it." "i want to call you guys when i want therapy" "I can't because I am sore from doing my exercises you already told me" "i have been up walking".  Family in room encouraging patient to participate and patient states "No. I already told her. We have been through this and it is not happening"  Rn reports patient has not been ambulating today and that he has only sat EOB for medication.   OT educated patient on purpose of therapy, that this is a documented refusal and that x3 refusal documented could lead to discontinued therapy and when asking patient to provide a clear time to return to provide care at a more appropriate time for patient he responds "i dont know a time that works" Pt requires max encouragement for evaluation to initiate and denial this session.   Vonita Moss   OTR/L Pager: [phone] Office: [phone] .  07/23/2015, 3:16 PM

## 2015-07-24 ENCOUNTER — Encounter (HOSPITAL_COMMUNITY): Payer: Self-pay | Admitting: Physician Assistant

## 2015-07-24 DIAGNOSIS — R197 Diarrhea, unspecified: Secondary | ICD-10-CM

## 2015-07-24 DIAGNOSIS — A319 Mycobacterial infection, unspecified: Secondary | ICD-10-CM

## 2015-07-24 DIAGNOSIS — G8929 Other chronic pain: Secondary | ICD-10-CM

## 2015-07-24 DIAGNOSIS — R1013 Epigastric pain: Secondary | ICD-10-CM

## 2015-07-24 DIAGNOSIS — Q821 Xeroderma pigmentosum: Secondary | ICD-10-CM

## 2015-07-24 DIAGNOSIS — K2951 Unspecified chronic gastritis with bleeding: Secondary | ICD-10-CM | POA: Insufficient documentation

## 2015-07-24 DIAGNOSIS — E860 Dehydration: Secondary | ICD-10-CM

## 2015-07-24 DIAGNOSIS — F4329 Adjustment disorder with other symptoms: Secondary | ICD-10-CM

## 2015-07-24 DIAGNOSIS — F0280 Dementia in other diseases classified elsewhere without behavioral disturbance: Secondary | ICD-10-CM

## 2015-07-24 DIAGNOSIS — B20 Human immunodeficiency virus [HIV] disease: Secondary | ICD-10-CM | POA: Insufficient documentation

## 2015-07-24 DIAGNOSIS — D6189 Other specified aplastic anemias and other bone marrow failure syndromes: Secondary | ICD-10-CM

## 2015-07-24 LAB — OVA + PARASITE EXAM

## 2015-07-24 LAB — O&P RESULT

## 2015-07-24 MED ORDER — LIDOCAINE HCL (PF) 2 % IJ SOLN
0.0000 mL | Freq: Once | INTRAMUSCULAR | Status: DC | PRN
  Filled 2015-07-24: qty 20

## 2015-07-24 MED ORDER — DRONABINOL 2.5 MG PO CAPS
2.5000 mg | ORAL_CAPSULE | Freq: Two times a day (BID) | ORAL | Status: DC
  Administered 2015-07-24 – 2015-07-30 (×9): 2.5 mg via ORAL
  Filled 2015-07-24 (×12): qty 1

## 2015-07-24 MED ORDER — SODIUM CHLORIDE 0.9 % IV SOLN: INTRAVENOUS | Status: DC
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
## 2015-07-24 NOTE — Progress Notes (Signed)
[facility] for Infectious Disease    Subjective:   C/o hiccups   Antibiotics:  Anti-infectives    Start     Dose/Rate Route Frequency Ordered Stop   07/21/15 1800  fluconazole (DIFLUCAN) tablet 100 mg     100 mg Oral Daily 07/21/15 0955     07/21/15 1000  azithromycin (ZITHROMAX) tablet 600 mg     600 mg Oral Daily 07/21/15 0954     07/19/15 0400  vancomycin (VANCOCIN) IVPB 750 mg/150 ml premix  Status:  Discontinued     750 mg 150 mL/hr over 60 Minutes Intravenous Every 12 hours 07/18/15 1400 07/21/15 0954   07/18/15 2200  piperacillin-tazobactam (ZOSYN) IVPB 3.375 g  Status:  Discontinued     3.375 g 12.5 mL/hr over 240 Minutes Intravenous Every 8 hours 07/18/15 1400 07/24/15 0934   07/18/15 2200  clarithromycin (BIAXIN) tablet 500 mg  Status:  Discontinued     500 mg Oral Every 12 hours 07/18/15 1735 07/21/15 0954   07/18/15 1830  rifabutin (MYCOBUTIN) capsule 300 mg  Status:  Discontinued     300 mg Oral Every 24 hours 07/18/15 1735 07/21/15 0954   07/18/15 1830  fluconazole (DIFLUCAN) IVPB 100 mg  Status:  Discontinued     100 mg 50 mL/hr over 60 Minutes Intravenous Every 24 hours 07/18/15 1735 07/21/15 0955   07/18/15 1745  ethambutol (MYAMBUTOL) tablet 800 mg     15 mg/kg  54 kg Oral Daily 07/18/15 1735     07/18/15 1700  dolutegravir (TIVICAY) tablet 50 mg     50 mg Oral Daily 07/18/15 1543     07/18/15 1700  emtricitabine-tenofovir AF (DESCOVY) 200-25 MG per tablet 1 tablet     1 tablet Oral Daily 07/18/15 1543     07/18/15 1700  azithromycin (ZITHROMAX) tablet 1,200 mg  Status:  Discontinued     1,200 mg Oral Weekly 07/18/15 1543 07/18/15 1620   07/18/15 1700  azithromycin (ZITHROMAX) tablet 600 mg  Status:  Discontinued     600 mg Oral Daily 07/18/15 1620 07/18/15 1735   07/18/15 1415  piperacillin-tazobactam (ZOSYN) IVPB 3.375 g     3.375 g 100 mL/hr over 30 Minutes Intravenous  Once 07/18/15 1400 07/18/15 1511   07/18/15 1400  vancomycin  (VANCOCIN) IVPB 1000 mg/200 mL premix     1,000 mg 200 mL/hr over 60 Minutes Intravenous  Once 07/18/15 1400 07/19/15 0217   07/18/15 1330  sulfamethoxazole-trimethoprim (BACTRIM DS,SEPTRA DS) 800-160 MG per tablet 1 tablet     1 tablet Oral Daily 07/18/15 1326        Medications: Scheduled Meds: . antiseptic oral rinse  7 mL Mouth Rinse BID  . azithromycin  600 mg Oral Daily  . dolutegravir  50 mg Oral Daily  . dronabinol  2.5 mg Oral BID AC  . emtricitabine-tenofovir AF  1 tablet Oral Daily  . ethambutol  15 mg/kg Oral Daily  . feeding supplement  1 Container Oral TID WC  . feeding supplement (ENSURE ENLIVE)  237 mL Oral BID BM  . ferrous sulfate  325 mg Oral Daily  . fluconazole  100 mg Oral Daily  . folic acid  1 mg Oral Daily  . megestrol  400 mg Oral Daily  . nicotine  21 mg Transdermal Daily  . pantoprazole  40 mg Oral Daily  . sodium chloride  3 mL Intravenous Q12H  . sulfamethoxazole-trimethoprim  1 tablet Oral Daily  Continuous Infusions: .  sodium bicarbonate 150 mEq in sterile water 1000 mL infusion 75 mL/hr at 07/23/15 2006   PRN Meds:.HYDROcodone-acetaminophen, lidocaine    Objective: Weight change: 1 lb 1 oz (0.483 kg)  Intake/Output Summary (Last 24 hours) at 07/24/15 1501 Last data filed at 07/24/15 1436  Gross per 24 hour  Intake    360 ml  Output      0 ml  Net    360 ml   Blood pressure 95/70, pulse 76, temperature 97.6 F (36.4 C), temperature source Oral, resp. rate 18, height 5\' 7"  (1.702 m), weight 117 lb 1 oz (53.1 kg), SpO2 100 %. Temp:  [97.5 F (36.4 C)-97.6 F (36.4 C)] 97.6 F (36.4 C) (12/22 0919) Pulse Rate:  [73-96] 76 (12/22 0919) Resp:  [16-18] 18 (12/22 0919) BP: (93-103)/(62-78) 95/70 mmHg (12/22 0919) SpO2:  [100 %] 100 % (12/22 0919) Weight:  [117 lb 1 oz (53.1 kg)] 117 lb 1 oz (53.1 kg) (12/21 2100)  Physical Exam: General: Alert and awake, oriented x3, cachectic HEENT: anicteric sclera,, EOMI, he has purplish lesions  on palate ? KS CVS regular rate, normal r,  no murmur rubs or gallops Chest: clear to auscultation bilaterally, no wheezing, rales or rhonchi Abdomen: soft nontender, nondistended, normal bowel sounds, Extremities: no  clubbing or edema noted bilaterally Skin: ulcerated lesion on shin also ? KS Neuro: nonfocal  CBC:  CBC Latest Ref Rng 07/23/2015 07/22/2015 07/20/2015  WBC 4.0 - 10.5 K/uL 2.8(L) 3.3(L) -  Hemoglobin 13.0 - 17.0 g/dL 9.4(L) 10.9(L) 9.1(L)  Hematocrit 39.0 - 52.0 % 27.9(L) 32.2(L) 27.5(L)  Platelets 150 - 400 K/uL 42(L) 55(L) -      BMET  Recent Labs  07/22/15 0955 07/23/15 1110  NA 142 141  K 2.4* 3.0*  CL 116* 113*  CO2 20* 21*  GLUCOSE 62* 81  BUN 21* 22*  CREATININE 1.33* 1.34*  CALCIUM 7.0* 6.8*     Liver Panel   Recent Labs  07/22/15 0955  PROT 4.1*  ALBUMIN 1.3*  AST 36  ALT 16*  ALKPHOS 193*  BILITOT 1.0       Sedimentation Rate No results for input(s): ESRSEDRATE in the last 72 hours. C-Reactive Protein No results for input(s): CRP in the last 72 hours.  Micro Results: Recent Results (from the past 720 hour(s))  Culture, blood (Routine X 2) w Reflex to ID Panel     Status: None   Collection Time: 07/18/15 11:30 AM  Result Value Ref Range Status   Specimen Description BLOOD RIGHT ANTECUBITAL  Final   Special Requests BOTTLES DRAWN AEROBIC ONLY 5CC  Final   Culture NO GROWTH 5 DAYS  Final   Report Status 07/23/2015 FINAL  Final  Culture, blood (Routine X 2) w Reflex to ID Panel     Status: None   Collection Time: 07/18/15 11:45 AM  Result Value Ref Range Status   Specimen Description BLOOD LEFT ANTECUBITAL  Final   Special Requests BOTTLES DRAWN AEROBIC ONLY 5CC  Final   Culture NO GROWTH 5 DAYS  Final   Report Status 07/23/2015 FINAL  Final  Gastrointestinal Panel by PCR , Stool     Status: None   Collection Time: 07/18/15  3:44 PM  Result Value Ref Range Status   Campylobacter species NOT DETECTED NOT DETECTED Final     Plesimonas shigelloides NOT DETECTED NOT DETECTED Final   Salmonella species NOT DETECTED NOT DETECTED Final   Yersinia enterocolitica NOT DETECTED NOT DETECTED  Final   Vibrio species NOT DETECTED NOT DETECTED Final   Vibrio cholerae NOT DETECTED NOT DETECTED Final   Enteroaggregative E coli (EAEC) NOT DETECTED NOT DETECTED Final   Enteropathogenic E coli (EPEC) NOT DETECTED NOT DETECTED Final   Enterotoxigenic E coli (ETEC) NOT DETECTED NOT DETECTED Final   Shiga like toxin producing E coli (STEC) NOT DETECTED NOT DETECTED Final   E. coli O157 NOT DETECTED NOT DETECTED Final   Shigella/Enteroinvasive E coli (EIEC) NOT DETECTED NOT DETECTED Final   Cryptosporidium NOT DETECTED NOT DETECTED Final   Cyclospora cayetanensis NOT DETECTED NOT DETECTED Final   Entamoeba histolytica NOT DETECTED NOT DETECTED Final   Giardia lamblia NOT DETECTED NOT DETECTED Final   Adenovirus F40/41 NOT DETECTED NOT DETECTED Final   Astrovirus NOT DETECTED NOT DETECTED Final   Norovirus GI/GII NOT DETECTED NOT DETECTED Final   Rotavirus A NOT DETECTED NOT DETECTED Final   Sapovirus (I, II, IV, and V) NOT DETECTED NOT DETECTED Final  OVA + PARASITE EXAM     Status: None   Collection Time: 07/18/15  3:44 PM  Result Value Ref Range Status   OVA + PARASITE EXAM Final report  Final    Comment: (NOTE) These results were obtained using wet preparation(s) and trichrome stained smear. This test does not include testing for Cryptosporidium parvum, Cyclospora, or Microsporidia. Performed At: [facility] [place], [place] [id number] Lindon Romp MD [id number]    Source of Sample STOOL  Final  AFB culture with smear     Status: None (Preliminary result)   Collection Time: 07/18/15  3:44 PM  Result Value Ref Range Status   Specimen Description STOOL  Final   Special Requests Immunocompromised  Final   Acid Fast Smear   Final    7183 3+ ACID FAST BACILLI SEEN CRITICAL RESULT CALLED  TO, READ BACK BY AND VERIFIED WITH: ALBERT 14:45 08/18/14 FUENG FAXED TO 832 CRITICAL RESULT CALLED TO, READ BACK BY AND VERIFIED WITH: SUSAN CARDWELL AT H.D.10:05 07/24/15 [place] Performed at [facility]    Culture   Final    CULTURE WILL BE EXAMINED FOR 6 WEEKS BEFORE ISSUING A FINAL REPORT Performed at [facility]    Report Status PENDING  Incomplete  C difficile quick scan w PCR reflex     Status: None   Collection Time: 07/19/15  1:43 PM  Result Value Ref Range Status   C Diff antigen NEGATIVE NEGATIVE Final   C Diff toxin NEGATIVE NEGATIVE Final   C Diff interpretation Negative for toxigenic C. difficile  Final    Studies/Results: No results found.    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 male with  AIDS, thrush, possible KS on skin and in oropharynx, dysphagia, pancytopenia, disseminated M avium, failure to thrive  #1 HIV/AIDS: continue Tivicay and Descovy,   Continue bactrim for OI prophylaxis  NOTE THE PATIENT'S PROGNOSIS IS NOT POOR--IF AND THIS IS THE CONDITION IF HE DOES TAKE HIS MEDS AND I HAVE MORE CONFIDENCE HE CAN IN A STRUCTURED ENVIRONMENT WITH SUPPORT FROM SISTER AND POTENTIALLY IN REHAB  FACILITY  #2 M avium continue Azithromycin f ETH  #3 Thrush: continue fluconazole   #4 Possible KS in OP and skin:  Plastics has offered punch biopsy of his right shin and to send to pathology and he has refused today-  GI to perform EGD and will get some visualization of his esophagus  NOTE I WOULD NOT BIOPSY ANYTHING IN THE ESOPHAGUS THAT LOOKS LIKE KS SINCE IT CAN BLEED VIGOROUSLY  HE ALSO NEEDS BIOPSY OF HIS PALATE, would ask ENT to see him for biopsy  Continue ARV   #5 Noncompliance: claims there is obstacle to obtaining meds. I called Sharyn Lull with [facility] and either she or bridge counselor and I believe Sharyn Lull herself came yesterday. Not sure if he will need ADAP soon when insurance expires  #6 ? HCAP: dc vancomycin, DC ZOSYN  #7 FTT: I think he needs either a SNF or [facility] for rehab--the latter ONLY if they will allow him to take his ARVS, antifungals, m avium drugs. Finally if he ends up needing doxil for KS they would not likely accomodate him  #8 Refusing meds noncompliance: I wonder if he has a signficant degree of HIV related dementia and perhaps he may need to be in SNF or structured environment to improve. Consider psych consult for competency and agree also with palliative care consult. His sister seems like strong advocate  #9 ? GIB : MOTHER called my RN Ambre with concern pt was having UGIB or LGIB and needed endoscopy And he will have the former done very soon  GREATLY APPRECIATE PRIMARY TEAM, PLASTICS AND GI HELP HERE!   LOS: 6 days   Alcide Evener 07/24/2015, 3:01 PM

## 2015-07-24 NOTE — Progress Notes (Addendum)
[facility] PROGRESS NOTE  Miguel Campos [id number] DOB: Aug 05, 1970 DOA: 07/18/2015 PCP: Isaias Cowman, PA-C  Assessment/Plan: #1 healthcare associated pneumonia Per chest x-ray and clinical symptoms. Patient states some clinical improvement. Patient also with hypotension. Patient has been pancultured results pending. Patient is afebrile. Patient with a leukopenia with white count of 2.9. Continue empiric IV Zosyn. ID following.  #2 hypotension Likely secondary to hypovolemia and infectious etiology. Patient mentating well. Systolic blood pressures in the mid-high 90s. IV fluids. Also given patient IV albumin. Patient noted to have a hemoglobin of 7.3 on 07/20/2015. Patient is status post 2 units packed red blood cells with improvement with hypotension. Follow H&H. Follow.  #3 oral thrush Continue Diflucan.  #4 MAI Continue azithromycin, ethambutol per ID recommendations.Rifabutin has been d/c'd per ID.  #5 AIDS Patient's CD4 less than 10 on 06/20/2015. Viral load is 71700 from 06/20/2015. Genotype pending. Continue ART therapy per ID. ID following and appreciate input and recommendations. Skin lesions to be biopsied, Plastic surgery consulted, but patient refused.   #6 severe protein calorie malnutrition Patient has been started on Megace. Nutritional supplementation.  #7 diarrhea Improved. Stool studies negative to date. GI panel negative. C. difficile PCR negative. Stool ova and parasites pending. AFB culture with smear on stool positive. Patient on azithromycin, rifampin, ethambutol for MAI per ID. ID following.  #8 history of CHF/cardiomyopathy Prior 2-D echo with EF of 30-35%. Patient dehydrated and hypovolemic. Continue IV fluids. Beta blocker on hold secondary to hypotension. Follow.  #9 pancytopenia Likely secondary to AIDS. Patient with a white count of 2.9, hemoglobin of 7.3, platelet count of 78 on 07/20/2015.Marland Kitchen Patient denies any overt bleeding. Continue empiric  IV antibiotics. S/P 2 units of packed red blood cells secondary to drop his hemoglobin and his hypotension. Hemoglobin currently at 10.9.  Follow.  #10 UTI  Continue IV Zosyn.  #11 hypokalemia/hypomagnesemia Likely secondary to GI losses. Repleted but pt refused labs today.  #12 prognosis Patient with a poor prognosis. Patient with AIDS and has been noncompliant with his medications. Patient presented for the second time to this hospital over the past 2 months and has been admitted 6 times over the past 2 months. Patient with failure to thrive. Patient is cachectic and deteriorating.  Palliative care consulted and pt refused to participate in goals of care.  Psychiatry consulted to evaluate for capacity for evaluation.     #13 guiac positive stools :  Difficulty swallowing.  Gastroenterology consulted and plan for EGD in am.       Code Status: Full Family Communication: none at bedside. Disposition Plan: Skilled nursing facility when medically stable.   Consultants:  Infectious diseases: Dr. Johnnye Sima 07/18/2015  Procedures:  Chest x-ray 07/18/2015  Abdominal films 07/19/2015  2 units packed red blood cells 07/20/2015  Antibiotics:  Oral clarithromycin 07/18/2015  Ethambutol 07/18/2015  IV Diflucan 07/18/2015  IV Zosyn 07/18/2015  IV vancomycin 07/18/2015>>>>07/21/2015  Oral Bactrim 07/18/2015  Rifabutin 07/18/2015  HPI/Subjective: Sleeping comfortably. No new complaints. Wants to be left alone. None at bedside.   Objective: Filed Vitals:   07/24/15 0919 07/24/15 1654  BP: 95/70 111/76  Pulse: 76 88  Temp: 97.6 F (36.4 C) 98.5 F (36.9 C)  Resp: 18 18    Intake/Output Summary (Last 24 hours) at 07/24/15 1931 Last data filed at 07/24/15 1854  Gross per 24 hour  Intake    360 ml  Output      0 ml  Net    360 ml  Filed Weights   07/21/15 2059 07/22/15 2011 07/23/15 2100  Weight: 68.493 kg (151 lb) 52.617 kg (116 lb) 53.1 kg (117 lb 1 oz)     Exam:   General:  NAD. Cachetic. Frail  Cardiovascular: Regular rate rhythm  Respiratory: Clear to auscultation bilaterally anterior lung fields.  Abdomen: Soft, nontender, nondistended, positive bowel sounds.  Musculoskeletal: No clubbing or cyanosis. Bilateral pedal edema.  Data Reviewed: Basic Metabolic Panel:  Recent Labs Lab 07/18/15 1110 07/19/15 0945 07/20/15 0439 07/22/15 0955 07/23/15 1110  NA 138 139 140 142 141  K 3.8 3.1* 2.8* 2.4* 3.0*  CL 113* 115* 120* 116* 113*  CO2 17* 15* 13* 20* 21*  GLUCOSE 67 79 62* 62* 81  BUN 42* 41* 28* 21* 22*  CREATININE 1.19 1.34* 1.40* 1.33* 1.34*  CALCIUM 7.5* 7.0* 7.3* 7.0* 6.8*  MG  --  1.6* 2.5* 1.8 2.2   Liver Function Tests:  Recent Labs Lab 07/18/15 1110 07/22/15 0955  AST 61* 36  ALT 25 16*  ALKPHOS 295* 193*  BILITOT 0.6 1.0  PROT 3.9* 4.1*  ALBUMIN <1.0* 1.3*    Recent Labs Lab 07/18/15 1110  LIPASE 30   No results for input(s): AMMONIA in the last 168 hours. CBC:  Recent Labs Lab 07/18/15 1110 07/19/15 0945 07/20/15 0439 07/20/15 2303 07/22/15 0955 07/23/15 1110  WBC 3.6* 2.7* 2.9*  --  3.3* 2.8*  NEUTROABS 3.1  --  2.6  --   --   --   HGB 9.9* 9.4* 7.3* 9.1* 10.9* 9.4*  HCT 30.8* 30.1* 22.6* 27.5* 32.2* 27.9*  MCV 77.2* 76.8* 74.8*  --  75.2* 75.2*  PLT 111* 82* 78*  --  55* 42*   Cardiac Enzymes: No results for input(s): CKTOTAL, CKMB, CKMBINDEX, TROPONINI in the last 168 hours. BNP (last 3 results)  Recent Labs  06/20/15 2002  BNP 243.8*    ProBNP (last 3 results) No results for input(s): PROBNP in the last 8760 hours.  CBG:  Recent Labs Lab 07/18/15 1108 07/18/15 1132  GLUCAP 43* 137*    Recent Results (from the past 240 hour(s))  Culture, blood (Routine X 2) w Reflex to ID Panel     Status: None   Collection Time: 07/18/15 11:30 AM  Result Value Ref Range Status   Specimen Description BLOOD RIGHT ANTECUBITAL  Final   Special Requests BOTTLES DRAWN AEROBIC  ONLY 5CC  Final   Culture NO GROWTH 5 DAYS  Final   Report Status 07/23/2015 FINAL  Final  Culture, blood (Routine X 2) w Reflex to ID Panel     Status: None   Collection Time: 07/18/15 11:45 AM  Result Value Ref Range Status   Specimen Description BLOOD LEFT ANTECUBITAL  Final   Special Requests BOTTLES DRAWN AEROBIC ONLY 5CC  Final   Culture NO GROWTH 5 DAYS  Final   Report Status 07/23/2015 FINAL  Final  Gastrointestinal Panel by PCR , Stool     Status: None   Collection Time: 07/18/15  3:44 PM  Result Value Ref Range Status   Campylobacter species NOT DETECTED NOT DETECTED Final   Plesimonas shigelloides NOT DETECTED NOT DETECTED Final   Salmonella species NOT DETECTED NOT DETECTED Final   Yersinia enterocolitica NOT DETECTED NOT DETECTED Final   Vibrio species NOT DETECTED NOT DETECTED Final   Vibrio cholerae NOT DETECTED NOT DETECTED Final   Enteroaggregative E coli (EAEC) NOT DETECTED NOT DETECTED Final   Enteropathogenic E coli (EPEC) NOT DETECTED NOT  DETECTED Final   Enterotoxigenic E coli (ETEC) NOT DETECTED NOT DETECTED Final   Shiga like toxin producing E coli (STEC) NOT DETECTED NOT DETECTED Final   E. coli O157 NOT DETECTED NOT DETECTED Final   Shigella/Enteroinvasive E coli (EIEC) NOT DETECTED NOT DETECTED Final   Cryptosporidium NOT DETECTED NOT DETECTED Final   Cyclospora cayetanensis NOT DETECTED NOT DETECTED Final   Entamoeba histolytica NOT DETECTED NOT DETECTED Final   Giardia lamblia NOT DETECTED NOT DETECTED Final   Adenovirus F40/41 NOT DETECTED NOT DETECTED Final   Astrovirus NOT DETECTED NOT DETECTED Final   Norovirus GI/GII NOT DETECTED NOT DETECTED Final   Rotavirus A NOT DETECTED NOT DETECTED Final   Sapovirus (I, II, IV, and V) NOT DETECTED NOT DETECTED Final  OVA + PARASITE EXAM     Status: None   Collection Time: 07/18/15  3:44 PM  Result Value Ref Range Status   OVA + PARASITE EXAM Final report  Final    Comment: (NOTE) These results were  obtained using wet preparation(s) and trichrome stained smear. This test does not include testing for Cryptosporidium parvum, Cyclospora, or Microsporidia. Performed At: [facility] [place], [place] [id number] Lindon Romp MD [id number]    Source of Sample STOOL  Final  AFB culture with smear     Status: None (Preliminary result)   Collection Time: 07/18/15  3:44 PM  Result Value Ref Range Status   Specimen Description STOOL  Final   Special Requests Immunocompromised  Final   Acid Fast Smear   Final    7183 3+ ACID FAST BACILLI SEEN CRITICAL RESULT CALLED TO, READ BACK BY AND VERIFIED WITH: ALBERT 14:45 08/18/14 FUENG FAXED TO 832 CRITICAL RESULT CALLED TO, READ BACK BY AND VERIFIED WITH: SUSAN CARDWELL AT H.D.10:05 07/24/15 [place] Performed at [facility]    Culture   Final    CULTURE WILL BE EXAMINED FOR 6 WEEKS BEFORE ISSUING A FINAL REPORT Performed at [facility]    Report Status PENDING  Incomplete  C difficile quick scan w PCR reflex     Status: None   Collection Time: 07/19/15  1:43 PM  Result Value Ref Range Status   C Diff antigen NEGATIVE NEGATIVE Final   C Diff toxin NEGATIVE NEGATIVE Final   C Diff interpretation Negative for toxigenic C. difficile  Final     Studies: No results found.  Scheduled Meds: . antiseptic oral rinse  7 mL Mouth Rinse BID  . azithromycin  600 mg Oral Daily  . dolutegravir  50 mg Oral Daily  . dronabinol  2.5 mg Oral BID AC  . emtricitabine-tenofovir AF  1 tablet Oral Daily  . ethambutol  15 mg/kg Oral Daily  . feeding supplement  1 Container Oral TID WC  . feeding supplement (ENSURE ENLIVE)  237 mL Oral BID BM  . ferrous sulfate  325 mg Oral Daily  . fluconazole  100 mg Oral Daily  . folic acid  1 mg Oral Daily  . megestrol  400 mg Oral Daily  . nicotine  21 mg Transdermal Daily  . pantoprazole  40 mg Oral Daily  . sodium chloride  3 mL Intravenous Q12H  .  sulfamethoxazole-trimethoprim  1 tablet Oral Daily   Continuous Infusions: .  sodium bicarbonate 150 mEq in sterile water 1000 mL infusion 75 mL/hr at 07/23/15 2006    Principal Problem:   HCAP (healthcare-associated pneumonia) Active Problems:   AIDS ([place])   Tobacco  abuse disorder   Arterial hypotension   Anemia due to bone marrow failure (HCC)   Abdominal pain, chronic, epigastric   HIV disease (HCC)   Chronic combined systolic and diastolic congestive heart failure (HCC)   Protein-calorie malnutrition (HCC)   Cardiomyopathy (HCC)   Weight loss   Diarrhea   UTI (urinary tract infection)   Oral thrush   Dehydration   Orthostatic hypotension   Cachexia ([place])   Kaposi's disease   Mycobacterial infection, non-TB   Palliative care encounter   Hypokalemia   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 If 7PM-7AM, please contact night-coverage at [email or web address], password [facility] 07/24/2015, 7:31 PM  LOS: 6 days

## 2015-07-24 NOTE — Progress Notes (Signed)
Tried to explain and convince patient for lab draw but patient refused saying "no".  MD made aware.
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
## 2015-07-24 NOTE — Progress Notes (Signed)
[facility]    Subjective:   No new complaints when I visited him   Antibiotics:  Anti-infectives    Start     Dose/Rate Route Frequency Ordered Stop   07/21/15 1800  fluconazole (DIFLUCAN) tablet 100 mg     100 mg Oral Daily 07/21/15 0955     07/21/15 1000  azithromycin (ZITHROMAX) tablet 600 mg     600 mg Oral Daily 07/21/15 0954     07/19/15 0400  vancomycin (VANCOCIN) IVPB 750 mg/150 ml premix  Status:  Discontinued     750 mg 150 mL/hr over 60 Minutes Intravenous Every 12 hours 07/18/15 1400 07/21/15 0954   07/18/15 2200  piperacillin-tazobactam (ZOSYN) IVPB 3.375 g     3.375 g 12.5 mL/hr over 240 Minutes Intravenous Every 8 hours 07/18/15 1400     07/18/15 2200  clarithromycin (BIAXIN) tablet 500 mg  Status:  Discontinued     500 mg Oral Every 12 hours 07/18/15 1735 07/21/15 0954   07/18/15 1830  rifabutin (MYCOBUTIN) capsule 300 mg  Status:  Discontinued     300 mg Oral Every 24 hours 07/18/15 1735 07/21/15 0954   07/18/15 1830  fluconazole (DIFLUCAN) IVPB 100 mg  Status:  Discontinued     100 mg 50 mL/hr over 60 Minutes Intravenous Every 24 hours 07/18/15 1735 07/21/15 0955   07/18/15 1745  ethambutol (MYAMBUTOL) tablet 800 mg     15 mg/kg  54 kg Oral Daily 07/18/15 1735     07/18/15 1700  dolutegravir (TIVICAY) tablet 50 mg     50 mg Oral Daily 07/18/15 1543     07/18/15 1700  emtricitabine-tenofovir AF (DESCOVY) 200-25 MG per tablet 1 tablet     1 tablet Oral Daily 07/18/15 1543     07/18/15 1700  azithromycin (ZITHROMAX) tablet 1,200 mg  Status:  Discontinued     1,200 mg Oral Weekly 07/18/15 1543 07/18/15 1620   07/18/15 1700  azithromycin (ZITHROMAX) tablet 600 mg  Status:  Discontinued     600 mg Oral Daily 07/18/15 1620 07/18/15 1735   07/18/15 1415  piperacillin-tazobactam (ZOSYN) IVPB 3.375 g     3.375 g 100 mL/hr over 30 Minutes Intravenous  Once 07/18/15 1400 07/18/15 1511   07/18/15 1400  vancomycin (VANCOCIN)  IVPB 1000 mg/200 mL premix     1,000 mg 200 mL/hr over 60 Minutes Intravenous  Once 07/18/15 1400 07/19/15 0217   07/18/15 1330  sulfamethoxazole-trimethoprim (BACTRIM DS,SEPTRA DS) 800-160 MG per tablet 1 tablet     1 tablet Oral Daily 07/18/15 1326        Medications: Scheduled Meds: . antiseptic oral rinse  7 mL Mouth Rinse BID  . azithromycin  600 mg Oral Daily  . dolutegravir  50 mg Oral Daily  . emtricitabine-tenofovir AF  1 tablet Oral Daily  . ethambutol  15 mg/kg Oral Daily  . feeding supplement  1 Container Oral TID WC  . feeding supplement (ENSURE ENLIVE)  237 mL Oral BID BM  . ferrous sulfate  325 mg Oral Daily  . fluconazole  100 mg Oral Daily  . folic acid  1 mg Oral Daily  . megestrol  400 mg Oral Daily  . nicotine  21 mg Transdermal Daily  . pantoprazole  40 mg Oral Daily  . piperacillin-tazobactam (ZOSYN)  IV  3.375 g Intravenous Q8H  . sodium chloride  3 mL Intravenous Q12H  . sulfamethoxazole-trimethoprim  1  tablet Oral Daily   Continuous Infusions: .  sodium bicarbonate 150 mEq in sterile water 1000 mL infusion 75 mL/hr at 07/23/15 2006   PRN Meds:.HYDROcodone-acetaminophen    Objective: Weight change: 1 lb 1 oz (0.483 kg)  Intake/Output Summary (Last 24 hours) at 07/24/15 0848 Last data filed at 07/23/15 1803  Gross per 24 hour  Intake    600 ml  Output      0 ml  Net    600 ml   Blood pressure 103/78, pulse 96, temperature 97.5 F (36.4 C), temperature source Oral, resp. rate 18, height 5\' 7"  (1.702 m), weight 117 lb 1 oz (53.1 kg), SpO2 100 %. Temp:  [97.4 F (36.3 C)-97.6 F (36.4 C)] 97.5 F (36.4 C) (12/22 0500) Pulse Rate:  [73-96] 96 (12/22 0500) Resp:  [14-18] 18 (12/22 0500) BP: (89-103)/(51-78) 103/78 mmHg (12/22 0500) SpO2:  [100 %] 100 % (12/22 0500) Weight:  [117 lb 1 oz (53.1 kg)] 117 lb 1 oz (53.1 kg) (12/21 2100)  Physical Exam: General: Alert and awake, oriented x3, cachectic HEENT: anicteric sclera,, EOMI, he has  purplish lesions on palate ? KS CVS regular rate, normal r,  no murmur rubs or gallops Chest: clear to auscultation bilaterally, no wheezing, rales or rhonchi Abdomen: soft nontender, nondistended, normal bowel sounds, Extremities: no  clubbing or edema noted bilaterally Skin: ulcerated lesion on shin also ? KS Neuro: nonfocal  CBC:  CBC Latest Ref Rng 07/23/2015 07/22/2015 07/20/2015  WBC 4.0 - 10.5 K/uL 2.8(L) 3.3(L) -  Hemoglobin 13.0 - 17.0 g/dL 9.4(L) 10.9(L) 9.1(L)  Hematocrit 39.0 - 52.0 % 27.9(L) 32.2(L) 27.5(L)  Platelets 150 - 400 K/uL 42(L) 55(L) -      BMET  Recent Labs  07/22/15 0955 07/23/15 1110  NA 142 141  K 2.4* 3.0*  CL 116* 113*  CO2 20* 21*  GLUCOSE 62* 81  BUN 21* 22*  CREATININE 1.33* 1.34*  CALCIUM 7.0* 6.8*     Liver Panel   Recent Labs  07/22/15 0955  PROT 4.1*  ALBUMIN 1.3*  AST 36  ALT 16*  ALKPHOS 193*  BILITOT 1.0       Sedimentation Rate No results for input(s): ESRSEDRATE in the last 72 hours. C-Reactive Protein No results for input(s): CRP in the last 72 hours.  Micro Results: Recent Results (from the past 720 hour(s))  Culture, blood (Routine X 2) w Reflex to ID Panel     Status: None   Collection Time: 07/18/15 11:30 AM  Result Value Ref Range Status   Specimen Description BLOOD RIGHT ANTECUBITAL  Final   Special Requests BOTTLES DRAWN AEROBIC ONLY 5CC  Final   Culture NO GROWTH 5 DAYS  Final   Report Status 07/23/2015 FINAL  Final  Culture, blood (Routine X 2) w Reflex to ID Panel     Status: None   Collection Time: 07/18/15 11:45 AM  Result Value Ref Range Status   Specimen Description BLOOD LEFT ANTECUBITAL  Final   Special Requests BOTTLES DRAWN AEROBIC ONLY 5CC  Final   Culture NO GROWTH 5 DAYS  Final   Report Status 07/23/2015 FINAL  Final  Gastrointestinal Panel by PCR , Stool     Status: None   Collection Time: 07/18/15  3:44 PM  Result Value Ref Range Status   Campylobacter species NOT DETECTED  NOT DETECTED Final   Plesimonas shigelloides NOT DETECTED NOT DETECTED Final   Salmonella species NOT DETECTED NOT DETECTED Final   Yersinia enterocolitica NOT  DETECTED NOT DETECTED Final   Vibrio species NOT DETECTED NOT DETECTED Final   Vibrio cholerae NOT DETECTED NOT DETECTED Final   Enteroaggregative E coli (EAEC) NOT DETECTED NOT DETECTED Final   Enteropathogenic E coli (EPEC) NOT DETECTED NOT DETECTED Final   Enterotoxigenic E coli (ETEC) NOT DETECTED NOT DETECTED Final   Shiga like toxin producing E coli (STEC) NOT DETECTED NOT DETECTED Final   E. coli O157 NOT DETECTED NOT DETECTED Final   Shigella/Enteroinvasive E coli (EIEC) NOT DETECTED NOT DETECTED Final   Cryptosporidium NOT DETECTED NOT DETECTED Final   Cyclospora cayetanensis NOT DETECTED NOT DETECTED Final   Entamoeba histolytica NOT DETECTED NOT DETECTED Final   Giardia lamblia NOT DETECTED NOT DETECTED Final   Adenovirus F40/41 NOT DETECTED NOT DETECTED Final   Astrovirus NOT DETECTED NOT DETECTED Final   Norovirus GI/GII NOT DETECTED NOT DETECTED Final   Rotavirus A NOT DETECTED NOT DETECTED Final   Sapovirus (I, II, IV, and V) NOT DETECTED NOT DETECTED Final  AFB culture with smear     Status: None (Preliminary result)   Collection Time: 07/18/15  3:44 PM  Result Value Ref Range Status   Specimen Description STOOL  Final   Special Requests Immunocompromised  Final   Acid Fast Smear   Final    7183 3+ ACID FAST BACILLI SEEN CRITICAL RESULT CALLED TO, READ BACK BY AND VERIFIED WITH: ALBERT 14:45 08/18/14 [place] FAXED TO 832 Performed at [facility] [facility]   Final    CULTURE WILL BE EXAMINED FOR 6 WEEKS BEFORE ISSUING A FINAL REPORT Performed at [facility]    Report Status PENDING  Incomplete  C difficile quick scan w PCR reflex     Status: None   Collection Time: 07/19/15  1:43 PM  Result Value Ref Range Status   C Diff antigen NEGATIVE NEGATIVE Final   C Diff toxin NEGATIVE NEGATIVE  Final   C Diff interpretation Negative for toxigenic C. difficile  Final    Studies/Results: No results found.    Assessment/Plan:  INTERVAL HISTORY: patient started on M avium therapy   Principal Problem:   HCAP (healthcare-associated pneumonia) Active Problems:   AIDS ([place])   Tobacco abuse disorder   Arterial hypotension   Anemia due to bone marrow failure (HCC)   Abdominal pain, chronic, epigastric   HIV disease (HCC)   Chronic combined systolic and diastolic congestive heart failure (HCC)   Protein-calorie malnutrition (HCC)   Cardiomyopathy (HCC)   Weight loss   Diarrhea   UTI (urinary tract infection)   Oral thrush   Dehydration   Orthostatic hypotension   Cachexia (HCC)   Kaposi's disease   Mycobacterial infection, non-TB   Palliative care encounter   Hypokalemia    Miguel Campos is a 44 y.o. male with  AIDS, thrush, possible KS on skin and in oropharynx, dysphagia, pancytopenia, disseminated M avium, failure to thrive  #1 HIV/AIDS: continue Tiv[place]ay and Descovy,   Continue bactrim for OI prophylaxis  NOTE THE PATIENT'S PROGNOSIS IS NOT POOR--IF AND THIS IS THE CONDITION IF HE DOES TAKE HIS MEDS AND I HAVE MORE CONFIDENCE HE CAN IN A STRUCTURED ENVIRONMENT WITH SUPPORT FROM SISTER AND POTENTIALLY IN REHAB FACILITY  #2 M avium: DC rifabutin duet to problems with TAF. WIll change from biaxin to Azithromycin for simplicity in dosing, and continue ETH  #3 Thrush: continue fluconazole change to orals  #4 Possible KS in OP and skin:  Would ask CCS  for punch biopsy of his right shin and to send to pathology  Would consider ask  GI if they would consider EGD for visual inspection of his esophagus for possible KS  IF any lesions appear c/w KS, WOULD NOT recommend GI  BIOPSY THEM GIVEN HIGH RISK OF BLEEDING FROM THEM IN ESOPHAGUS  HE ALSO NEEDS BIOPSY OF HIS PALATE, would ask ENT to see him for biopsy  Continue ARV   #5 Noncompliance: claims there is obstacle  to obtaining meds. I called Sharyn Lull with [facility] and either she or bridge counselor and I believe Sharyn Lull herself came yesterday. Not sure if he will need ADAP soon when insurance expires  #6 ? HCAP: dc vancomycin, DC ZOSYN AFTER TODAY  #7 FTT: I think he needs either a SNF or [place] for rehab--the latter ONLY if they will allow him to take his ARVS, antifungals, m avium drugs. Finally if he ends up needing doxil for KS they would not likely accomodate him  #8 Refusing meds noncompliance: I wonder if he has a signficant degree of HIV related dementia and perhaps he may need to be in SNF or structured environment to improve. Consider psych consult for competency and agree also with palliative care consult. His sister seems like strong advocate  #9 ? GIB : MOTHER called my RN Ambre with concern pt was having UGIB or LGIB and needed endoscopy    LOS: 6 days   Alcide Evener 07/24/2015, 8:48 AM
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
## 2015-07-24 NOTE — Progress Notes (Signed)
Patient refusing SCDs for VTE prophylaxis, tried to educate but he refused, MD aware.

## 2015-07-24 NOTE — Clinical Social Work Note (Signed)
Room visit with patient to discuss discharge planning and consult received by CSW regarding short-term rehab placement. Patient stated that he lives with his mother, stepfather and other family members and plans plans to discharge home. Patient feels he has all the support he needs as his mother is home during the day.  CSW signing off as patient plans to discharge home. Please reconsult if needed.   Torsha Lemus Givens, MSW, LCSW Licensed Clinical Social Worker [place] [phone]

## 2015-07-24 NOTE — Progress Notes (Signed)
Physical Therapy Treatment Patient Details Name: Miguel Campos MRN: [id number] DOB: Apr 01, 1971 Today's Date: 07/24/2015    History of Present Illness 44 yo male admitted PNA, oral thrush, UTI, diarrhea PMH: HTN, AIDS, noncompliant with medications, CHF    PT Comments    Able to tolerate gait training up to 20 feet in room today with min assist for balance. Dizziness reported but resolved immediately upon sitting. HR 144 while ambulating. Required assistance with bed mobility as well. Patient will continue to benefit from skilled physical therapy services to further improve independence with functional mobility.   Follow Up Recommendations  Home health PT;Supervision/Assistance - 24 hour     Equipment Recommendations  None recommended by PT    Recommendations for Other Services       Precautions / Restrictions Precautions Precautions: Fall Restrictions Weight Bearing Restrictions: No    Mobility  Bed Mobility Overal bed mobility: Needs Assistance Bed Mobility: Supine to Sit;Sit to Supine     Supine to sit: Min assist Sit to supine: Min assist   General bed mobility comments: Min assist for LE support in/out of bed. VC for technique. Requires extra time.  Transfers Overall transfer level: Needs assistance Equipment used: 1 person hand held assist Transfers: Sit to/from Stand Sit to Stand: Min assist         General transfer comment: Min assist for boost to stand and balance once upright. VC for awareness of instability. Slow to rise.  Ambulation/Gait Ambulation/Gait assistance: Min assist;+2 safety/equipment Ambulation Distance (Feet): 20 Feet Assistive device: 1 person hand held assist Gait Pattern/deviations: Step-through pattern;Decreased stride length;Staggering left;Staggering right;Drifts right/left;Wide base of support Gait velocity: slow Gait velocity interpretation: <1.8 ft/sec, indicative of risk for recurrent falls General Gait Details: Refused to  ambulate in hallway. Tolerated short distance around room with min assist for balance hand hand held support with +2 assist for equipment. Demonstrates notable instability, reaching for furniture with free hand. No buckling however. Reported dizziness towards end of distance and needed to sit. Resolved upon sitting. HR 144 ambulating, quickly returns to 120 and below after sitting.   Stairs            Wheelchair Mobility    Modified Rankin (Stroke Patients Only)       Balance Overall balance assessment: Needs assistance Sitting-balance support: No upper extremity supported;Feet supported Sitting balance-Leahy Scale: Good     Standing balance support: Single extremity supported Standing balance-Leahy Scale: Poor                      Cognition Arousal/Alertness: Awake/alert Behavior During Therapy: WFL for tasks assessed/performed Overall Cognitive Status: Impaired/Different from baseline Area of Impairment: Safety/judgement         Safety/Judgement: Decreased awareness of deficits;Decreased awareness of safety     General Comments: States he can ambulate without assistance, pt required assist to mobilize in bed, stand, and for balance with walking.    Exercises General Exercises - Lower Extremity Ankle Circles/Pumps: AROM;Both;Seated;5 reps [employer]:  (states he performs on his own) Straight Leg Raises: AAROM;Both;Strengthening;10 reps;Supine    General Comments General comments (skin integrity, edema, etc.): Encouraged to get OOB with staff more frequently to prevent skin breakdown and further debility.      Pertinent Vitals/Pain Pain Assessment: Faces Faces Pain Scale: Hurts little more Pain Location: abdominal nausea Pain Intervention(s): Monitored during session    [place] expects to be discharged to:: Private residence Living Arrangements: Parent Available Help at Discharge:  Family;Available 24 hours/day Type of Home:  House Home Access: Stairs to enter Entrance Stairs-Rails: Left Home Layout: Multi-level;Bed/bath upstairs (normally stays in bedroom) Home Equipment: None      Prior Function Level of Independence: Needs assistance  Gait / Transfers Assistance Needed: (A) hand held with mother to all transfers ADL's / Homemaking Assistance Needed: (A) with all adls. 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 (current goals can now be found in the care plan section) Acute Rehab PT Goals Patient Stated Goal: to get back to walking PT Goal Formulation: With patient Time For Goal Achievement: 07/29/15 Potential to Achieve Goals: Good Progress towards PT goals: Progressing toward goals    Frequency  Min 3X/week    PT Plan Current plan remains appropriate    Co-evaluation             End of Session Equipment Utilized During Treatment: Gait belt Activity Tolerance: Patient limited by fatigue Patient left: in bed;with call bell/phone within reach     Time: 1344-1401 PT Time Calculation (min) (ACUTE ONLY): 17 min  Charges:  $Therapeutic Activity: 8-22 mins                    G Codes:      Ellouise Newer 07/25/15, 2:40 PM Camille Bal [place], [place]

## 2015-07-25 ENCOUNTER — Encounter (HOSPITAL_COMMUNITY)
Admission: EM | Disposition: A | Discharge status: Hospice | Payer: Self-pay | Source: Home / Self Care | Attending: Internal Medicine

## 2015-07-25 ENCOUNTER — Encounter (HOSPITAL_COMMUNITY): Payer: Self-pay | Admitting: Gastroenterology

## 2015-07-25 ENCOUNTER — Encounter (HOSPITAL_COMMUNITY): Payer: Self-pay | Admitting: Anesthesiology

## 2015-07-25 DIAGNOSIS — R64 Cachexia: Secondary | ICD-10-CM

## 2015-07-25 DIAGNOSIS — I951 Orthostatic hypotension: Secondary | ICD-10-CM

## 2015-07-25 LAB — CBC
HCT: 30 % — ABNORMAL LOW (ref 39.0–52.0)
Hemoglobin: 10.1 g/dL — ABNORMAL LOW (ref 13.0–17.0)
MCH: 25.8 pg — AB (ref 26.0–34.0)
MCHC: 33.7 g/dL (ref 30.0–36.0)
MCV: 76.5 fL — ABNORMAL LOW (ref 78.0–100.0)
PLATELETS: 44 10*3/uL — AB (ref 150–400)
RBC: 3.92 MIL/uL — ABNORMAL LOW (ref 4.22–5.81)
RDW: 18.8 % — AB (ref 11.5–15.5)
WBC: 2.8 10*3/uL — ABNORMAL LOW (ref 4.0–10.5)

## 2015-07-25 LAB — BASIC METABOLIC PANEL
Anion gap: 7 (ref 5–15)
BUN: 17 mg/dL (ref 6–20)
CALCIUM: 7.2 mg/dL — AB (ref 8.9–10.3)
CO2: 24 mmol/L (ref 22–32)
CREATININE: 1.08 mg/dL (ref 0.61–1.24)
Chloride: 110 mmol/L (ref 101–111)
GFR calc Af Amer: >60 mL/min (ref >60)
GFR calc non Af Amer: >60 mL/min (ref >60)
GLUCOSE: 49 mg/dL — AB (ref 65–99)
Potassium: 2.5 mmol/L — CL (ref 3.5–5.1)
Sodium: 141 mmol/L (ref 135–145)

## 2015-07-25 LAB — GLUCOSE, CAPILLARY
Glucose-Capillary: 148 mg/dL — ABNORMAL HIGH (ref 65–99)
Glucose-Capillary: 17 mg/dL — CL (ref 65–99)
Glucose-Capillary: 94 mg/dL (ref 65–99)

## 2015-07-25 LAB — MAGNESIUM: MAGNESIUM: 1.7 mg/dL (ref 1.7–2.4)

## 2015-07-25 LAB — POTASSIUM: Potassium: 3.1 mmol/L — ABNORMAL LOW (ref 3.5–5.1)

## 2015-07-25 SURGERY — CANCELLED PROCEDURE

## 2015-07-25 MED ORDER — POTASSIUM CHLORIDE 10 MEQ/100ML IV SOLN
10.0000 meq | INTRAVENOUS | Status: AC
  Administered 2015-07-25 (×4): 10 meq via INTRAVENOUS
  Filled 2015-07-25 (×4): qty 100

## 2015-07-25 MED ORDER — PANTOPRAZOLE SODIUM 40 MG IV SOLR
40.0000 mg | INTRAVENOUS | Status: DC
  Administered 2015-07-25 – 2015-07-28 (×4): 40 mg via INTRAVENOUS
  Filled 2015-07-25 (×4): qty 40

## 2015-07-25 MED ORDER — MAGNESIUM SULFATE 2 GM/50ML IV SOLN
2.0000 g | Freq: Once | INTRAVENOUS | Status: AC
  Administered 2015-07-25: 2 g via INTRAVENOUS
  Filled 2015-07-25: qty 50

## 2015-07-25 MED ORDER — LORAZEPAM 2 MG/ML IJ SOLN
INTRAMUSCULAR | Status: AC
  Administered 2015-07-25: 1 mg via INTRAVENOUS
  Filled 2015-07-25: qty 1

## 2015-07-25 MED ORDER — FLUCONAZOLE IN SODIUM CHLORIDE 100-0.9 MG/50ML-% IV SOLN
100.0000 mg | INTRAVENOUS | Status: DC
  Administered 2015-07-26: 100 mg via INTRAVENOUS
  Filled 2015-07-25 (×5): qty 50

## 2015-07-25 MED ORDER — SULFAMETHOXAZOLE-TRIMETHOPRIM 200-40 MG/5ML PO SUSP
20.0000 mL | Freq: Every day | ORAL | Status: DC
  Administered 2015-07-26 – 2015-07-29 (×3): 20 mL via ORAL
  Filled 2015-07-25 (×7): qty 20

## 2015-07-25 MED ORDER — LORAZEPAM 2 MG/ML IJ SOLN
1.0000 mg | Freq: Four times a day (QID) | INTRAMUSCULAR | Status: DC | PRN
  Administered 2015-07-25 – 2015-07-26 (×2): 1 mg via INTRAVENOUS
  Filled 2015-07-25: qty 1

## 2015-07-25 MED ORDER — DEXTROSE-NACL 5-0.9 % IV SOLN
INTRAVENOUS | Status: DC
  Administered 2015-07-25 – 2015-07-26 (×2): via INTRAVENOUS

## 2015-07-25 MED ORDER — DEXTROSE 5 % IV SOLN
500.0000 mg | INTRAVENOUS | Status: DC
  Administered 2015-07-25 – 2015-07-26 (×2): 500 mg via INTRAVENOUS
  Filled 2015-07-25 (×3): qty 500

## 2015-07-25 MED ORDER — DEXTROSE 50 % IV SOLN
INTRAVENOUS | Status: AC
  Administered 2015-07-25: 50 mL
  Filled 2015-07-25: qty 100

## 2015-07-25 MED ORDER — POTASSIUM CHLORIDE 20 MEQ PO PACK
40.0000 meq | PACK | Freq: Two times a day (BID) | ORAL | Status: DC
  Filled 2015-07-25 (×4): qty 2

## 2015-07-25 NOTE — Progress Notes (Signed)
07/25/2015 11:27 AM  Patient and family member (mother) refused morning medication due to patient not being able to eat and feeling sick on his stomach. Still requested pain medication and was able to take that with sips of water. Informed patient and family member that medication was needed due to the fact that his potassium was low. Both voiced concerns about not wanting Endo test done anymore and wanting to eat now. Will inform MD. Patient and family member lack understanding of importance of test and needing to stay NPO. Will continue to assess and monitor the patient.   [employer], RN-BC, [facility] [facility] [address] Phone [phone]

## 2015-07-25 NOTE — Progress Notes (Signed)
[place] GASTROENTEROLOGY ROUNDING NOTE   Subjective: EGD cancelled due to hypokalemia. Patient refused PO Potassium.    Objective: Vital signs in last 24 hours: Temp:  [98 F (36.7 C)-99.2 F (37.3 C)] 98 F (36.7 C) (12/23 1000) Pulse Rate:  [88-95] 94 (12/23 1000) Resp:  [18-20] 20 (12/23 1000) BP: (93-111)/(64-76) 104/71 mmHg (12/23 1000) SpO2:  [92 %-100 %] 100 % (12/23 1000) Last BM Date: 07/24/15 General: NAD, cachetic Lungs: b/l crackles bases Heart: s1s2 Abdomen:soft, NTND Ext: No edema    Intake/Output from previous day: 12/22 0701 - 12/23 0700 In: 2902.5 [P.O.:360; I.V.:2542.5] Out: 0  Intake/Output this shift:     Lab Results:  Recent Labs  07/23/15 1110 07/25/15 0944  WBC 2.8* 2.8*  HGB 9.4* 10.1*  PLT 42* 44*  MCV 75.2* 76.5*   BMET  Recent Labs  07/23/15 1110 07/25/15 0944  NA 141 141  K 3.0* 2.5*  CL 113* 110  CO2 21* 24  GLUCOSE 81 49*  BUN 22* 17  CREATININE 1.34* 1.08  CALCIUM 6.8* 7.2*     Assessment  13 yr M with h/o HIV/AIDS, medical non compliance with Kaposi's disease, Mycobacterium avium, PCP pneumonia with failure to thrive, dysphagia and epigastric pain post prandial.  EGD today  was cancelled due to hypokalemia Will reschedule EGD to evaluate for possible esophageal involvement  with kaposi's disease on Tuesday 12/27 with anaesthesia NPO after midnight on 12/26 Monitor electrolytes and replete   K. Denzil Magnuson , MD [phone] Mon-Fri 8a-5p [phone] after 5p, weekends, holidays [facility]

## 2015-07-25 NOTE — Progress Notes (Signed)
07/25/2015  10:41 AM  Patient went to Endo with Tech, but then returned due to Potassium being to low. Informed RN in Endo before patient was taken to Endo about the critical value. Informed MD. Patient now back in room. Will continue to assess and monitor the patient.   [employer], RN-BC, [employer] [facility] [address] Phone [phone]

## 2015-07-25 NOTE — Progress Notes (Signed)
07/25/2015 10:38 AM  CRITICAL VALUE ALERT  Critical value received:  Potassium 2.5  Date of notification:  07/25/2015  Time of notification:  10:15AM  Critical value read back:Yes.    Nurse who received alert:  Maggie Font RN   MD notified (1st page):  Venia Minks  Time of first page:  10:38AM  Responding MD:  Venia Minks  Time MD responded:  10:40AM  Comments: New orders written. Will continue to assess and monitor the patient.   [employer], RN-BC, [employer] [address] Phone [phone]

## 2015-07-25 NOTE — Progress Notes (Signed)
Patient refused labs this morning. RN educated patient, patient still refused.

## 2015-07-25 NOTE — Progress Notes (Signed)
OT Cancellation Note  Patient Details Name: Miguel Campos MRN: [id number] DOB: [birth year]   Cancelled Treatment:    Reason Eval/Treat Not Completed: Patient declined, no reason specified  Pt with pending procedure in endo and currently with  Potassium being to low. Patient and mother requesting no therapy at this time.   Vonita Moss   OTR/L Pager: [phone] Office: [phone] .  07/25/2015, 11:17 AM

## 2015-07-25 NOTE — Progress Notes (Signed)
Subjective:    became tearful and was saying he wanted to go home he was overstimulated due to multiple family members in the room at the time of my visit   Antibiotics:  Anti-infectives    Start     Dose/Rate Route Frequency Ordered Stop   07/21/15 1800  fluconazole (DIFLUCAN) tablet 100 mg     100 mg Oral Daily 07/21/15 0955     07/21/15 1000  azithromycin (ZITHROMAX) tablet 600 mg     600 mg Oral Daily 07/21/15 0954     07/19/15 0400  vancomycin (VANCOCIN) IVPB 750 mg/150 ml premix  Status:  Discontinued     750 mg 150 mL/hr over 60 Minutes Intravenous Every 12 hours 07/18/15 1400 07/21/15 0954   07/18/15 2200  piperacillin-tazobactam (ZOSYN) IVPB 3.375 g  Status:  Discontinued     3.375 g 12.5 mL/hr over 240 Minutes Intravenous Every 8 hours 07/18/15 1400 07/24/15 0934   07/18/15 2200  clarithromycin (BIAXIN) tablet 500 mg  Status:  Discontinued     500 mg Oral Every 12 hours 07/18/15 1735 07/21/15 0954   07/18/15 1830  rifabutin (MYCOBUTIN) capsule 300 mg  Status:  Discontinued     300 mg Oral Every 24 hours 07/18/15 1735 07/21/15 0954   07/18/15 1830  fluconazole (DIFLUCAN) IVPB 100 mg  Status:  Discontinued     100 mg 50 mL/hr over 60 Minutes Intravenous Every 24 hours 07/18/15 1735 07/21/15 0955   07/18/15 1745  ethambutol (MYAMBUTOL) tablet 800 mg     15 mg/kg  54 kg Oral Daily 07/18/15 1735     07/18/15 1700  dolutegravir (TIVICAY) tablet 50 mg     50 mg Oral Daily 07/18/15 1543     07/18/15 1700  emtricitabine-tenofovir AF (DESCOVY) 200-25 MG per tablet 1 tablet     1 tablet Oral Daily 07/18/15 1543     07/18/15 1700  azithromycin (ZITHROMAX) tablet 1,200 mg  Status:  Discontinued     1,200 mg Oral Weekly 07/18/15 1543 07/18/15 1620   07/18/15 1700  azithromycin (ZITHROMAX) tablet 600 mg  Status:  Discontinued     600 mg Oral Daily 07/18/15 1620 07/18/15 1735   07/18/15 1415  piperacillin-tazobactam (ZOSYN) IVPB 3.375 g     3.375 g 100 mL/hr over 30  Minutes Intravenous  Once 07/18/15 1400 07/18/15 1511   07/18/15 1400  vancomycin (VANCOCIN) IVPB 1000 mg/200 mL premix     1,000 mg 200 mL/hr over 60 Minutes Intravenous  Once 07/18/15 1400 07/19/15 0217   07/18/15 1330  sulfamethoxazole-trimethoprim (BACTRIM DS,SEPTRA DS) 800-160 MG per tablet 1 tablet     1 tablet Oral Daily 07/18/15 1326        Medications: Scheduled Meds: . antiseptic oral rinse  7 mL Mouth Rinse BID  . azithromycin  600 mg Oral Daily  . dolutegravir  50 mg Oral Daily  . dronabinol  2.5 mg Oral BID AC  . emtricitabine-tenofovir AF  1 tablet Oral Daily  . ethambutol  15 mg/kg Oral Daily  . feeding supplement  1 Container Oral TID WC  . feeding supplement (ENSURE ENLIVE)  237 mL Oral BID BM  . ferrous sulfate  325 mg Oral Daily  . fluconazole  100 mg Oral Daily  . folic acid  1 mg Oral Daily  . megestrol  400 mg Oral Daily  . nicotine  21 mg Transdermal Daily  . pantoprazole  40 mg Oral Daily  .  potassium chloride  40 mEq Oral BID  . sodium chloride  3 mL Intravenous Q12H  . sulfamethoxazole-trimethoprim  1 tablet Oral Daily   Continuous Infusions: . sodium chloride Stopped (07/25/15 1015)  .  sodium bicarbonate 150 mEq in sterile water 1000 mL infusion 75 mL/hr at 07/25/15 1205   PRN Meds:.HYDROcodone-acetaminophen, lidocaine, LORazepam    Objective: Weight change:   Intake/Output Summary (Last 24 hours) at 07/25/15 1508 Last data filed at 07/25/15 0600  Gross per 24 hour  Intake 2662.5 ml  Output      0 ml  Net 2662.5 ml   Blood pressure 104/71, pulse 94, temperature 98 F (36.7 C), temperature source Oral, resp. rate 20, height 5\' 7"  (1.702 m), weight 117 lb 1 oz (53.1 kg), SpO2 100 %. Temp:  [98 F (36.7 C)-99.2 F (37.3 C)] 98 F (36.7 C) (12/23 1000) Pulse Rate:  [88-95] 94 (12/23 1000) Resp:  [18-20] 20 (12/23 1000) BP: (93-111)/(64-76) 104/71 mmHg (12/23 1000) SpO2:  [92 %-100 %] 100 % (12/23 1000)  Physical Exam: General: Alert  and awake, oriented x3, cachectic HEENT: anicteric sclera,, EOMI, he has purplish lesions on palate ? KS CVS regular rate, normal r,  no murmur rubs or gallops Chest: clear to auscultation bilaterally, no wheezing, rales or rhonchi Abdomen: soft nontender, nondistended, normal bowel sounds, Extremities: no  clubbing or edema noted bilaterally Skin: ulcerated lesion on shin also ? KS Neuro: nonfocal  CBC:  CBC Latest Ref Rng 07/25/2015 07/23/2015 07/22/2015  WBC 4.0 - 10.5 K/uL 2.8(L) 2.8(L) 3.3(L)  Hemoglobin 13.0 - 17.0 g/dL 10.1(L) 9.4(L) 10.9(L)  Hematocrit 39.0 - 52.0 % 30.0(L) 27.9(L) 32.2(L)  Platelets 150 - 400 K/uL 44(L) 42(L) 55(L)      BMET  Recent Labs  07/23/15 1110 07/25/15 0944  NA 141 141  K 3.0* 2.5*  CL 113* 110  CO2 21* 24  GLUCOSE 81 49*  BUN 22* 17  CREATININE 1.34* 1.08  CALCIUM 6.8* 7.2*     Liver Panel  No results for input(s): PROT, ALBUMIN, AST, ALT, ALKPHOS, BILITOT, BILIDIR, IBILI in the last 72 hours.     Sedimentation Rate No results for input(s): ESRSEDRATE in the last 72 hours. C-Reactive Protein No results for input(s): CRP in the last 72 hours.  Micro Results: Recent Results (from the past 720 hour(s))  Culture, blood (Routine X 2) w Reflex to ID Panel     Status: None   Collection Time: 07/18/15 11:30 AM  Result Value Ref Range Status   Specimen Description BLOOD RIGHT ANTECUBITAL  Final   Special Requests BOTTLES DRAWN AEROBIC ONLY 5CC  Final   Culture NO GROWTH 5 DAYS  Final   Report Status 07/23/2015 FINAL  Final  Culture, blood (Routine X 2) w Reflex to ID Panel     Status: None   Collection Time: 07/18/15 11:45 AM  Result Value Ref Range Status   Specimen Description BLOOD LEFT ANTECUBITAL  Final   Special Requests BOTTLES DRAWN AEROBIC ONLY 5CC  Final   Culture NO GROWTH 5 DAYS  Final   Report Status 07/23/2015 FINAL  Final  Gastrointestinal Panel by PCR , Stool     Status: None   Collection Time: 07/18/15  3:44  PM  Result Value Ref Range Status   Campylobacter species NOT DETECTED NOT DETECTED Final   Plesimonas shigelloides NOT DETECTED NOT DETECTED Final   Salmonella species NOT DETECTED NOT DETECTED Final   Yersinia enterocolitica NOT DETECTED NOT DETECTED Final  Vibrio species NOT DETECTED NOT DETECTED Final   Vibrio cholerae NOT DETECTED NOT DETECTED Final   Enteroaggregative E coli (EAEC) NOT DETECTED NOT DETECTED Final   Enteropathogenic E coli (EPEC) NOT DETECTED NOT DETECTED Final   Enterotoxigenic E coli (ETEC) NOT DETECTED NOT DETECTED Final   Shiga like toxin producing E coli (STEC) NOT DETECTED NOT DETECTED Final   E. coli O157 NOT DETECTED NOT DETECTED Final   Shigella/Enteroinvasive E coli (EIEC) NOT DETECTED NOT DETECTED Final   Cryptosporidium NOT DETECTED NOT DETECTED Final   Cyclospora cayetanensis NOT DETECTED NOT DETECTED Final   Entamoeba histolytica NOT DETECTED NOT DETECTED Final   Giardia lamblia NOT DETECTED NOT DETECTED Final   Adenovirus F40/41 NOT DETECTED NOT DETECTED Final   Astrovirus NOT DETECTED NOT DETECTED Final   Norovirus GI/GII NOT DETECTED NOT DETECTED Final   Rotavirus A NOT DETECTED NOT DETECTED Final   Sapovirus (I, II, IV, and V) NOT DETECTED NOT DETECTED Final  OVA + PARASITE EXAM     Status: None   Collection Time: 07/18/15  3:44 PM  Result Value Ref Range Status   OVA + PARASITE EXAM Final report  Final    Comment: (NOTE) These results were obtained using wet preparation(s) and trichrome stained smear. This test does not include testing for Cryptosporidium parvum, Cyclospora, or Microsporidia. Performed At: [facility] [place], [place] [id number] Lindon Romp MD [id number]    Source of Sample STOOL  Final  AFB culture with smear     Status: None (Preliminary result)   Collection Time: 07/18/15  3:44 PM  Result Value Ref Range Status   Specimen Description STOOL  Final   Special Requests Immunocompromised   Final   Acid Fast Smear   Final    7183 3+ ACID FAST BACILLI SEEN CRITICAL RESULT CALLED TO, READ BACK BY AND VERIFIED WITH: ALBERT 14:45 08/18/14 FUENG FAXED TO 832 CRITICAL RESULT CALLED TO, READ BACK BY AND VERIFIED WITH: SUSAN CARDWELL AT H.D.10:05 07/24/15 [place] Performed at [facility]    Culture   Final    CULTURE WILL BE EXAMINED FOR 6 WEEKS BEFORE ISSUING A FINAL REPORT Performed at [facility]    Report Status PENDING  Incomplete  C difficile quick scan w PCR reflex     Status: None   Collection Time: 07/19/15  1:43 PM  Result Value Ref Range Status   C Diff antigen NEGATIVE NEGATIVE Final   C Diff toxin NEGATIVE NEGATIVE Final   C Diff interpretation Negative for toxigenic C. difficile  Final    Studies/Results: No results found.    Assessment/Plan:  INTERVAL HISTORY: patient started on M avium therapy   Principal Problem:   HCAP (healthcare-associated pneumonia) Active Problems:   AIDS ([place])   Tobacco abuse disorder   Arterial hypotension   Anemia due to bone marrow failure (HCC)   Abdominal pain, chronic, epigastric   HIV disease (HCC)   Chronic combined systolic and diastolic congestive heart failure (HCC)   Protein-calorie malnutrition (HCC)   Cardiomyopathy (HCC)   Weight loss   Diarrhea   UTI (urinary tract infection)   Oral thrush   Dehydration   Orthostatic hypotension   Cachexia ([place])   Kaposi's disease   Mycobacterial infection, non-TB   Palliative care encounter   Hypokalemia   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 male with  AIDS, thrush, possible KS on skin and in oropharynx, dysphagia, pancytopenia, disseminated M avium, failure to thrive  #1 HIV/AIDS: continue Tivicay and Descovy,   Continue bactrim for OI prophylaxis  NOTE THE PATIENT'S PROGNOSIS IS NOT POOR--IF AND THIS IS THE CONDITION IF HE DOES  TAKE HIS MEDS AND I HAVE MORE CONFIDENCE HE CAN IN A STRUCTURED ENVIRONMENT WITH SUPPORT FROM SISTER AND POTENTIALLY IN REHAB FACILITY  #2 M avium continue Azithromycin f ETH  #3 Thrush: continue fluconazole   #4 Possible KS in OP and skin:  Plastics has offered punch biopsy of his right shin and to send to pathology and he has refused today-  GI to perform EGD and will get some visualization of his esophagus  NOTE I WOULD NOT BIOPSY ANYTHING IN THE ESOPHAGUS THAT LOOKS LIKE KS SINCE IT CAN BLEED VIGOROUSLY  HE ALSO NEEDS BIOPSY OF HIS PALATE, would ask ENT to see him for biopsy  Continue ARV   #5 Noncompliance: claims there is obstacle to obtaining meds. I called Sharyn Lull with [facility] and either she or bridge counselor and I believe Sharyn Lull herself came yesterday. Not sure if he will need ADAP soon when insurance expires  #6 ? HCAP: dc vancomycin, DCd ZOSYN  #7 FTT: His brother tells me that the Mom is not able to take care of the patient alone. I DO NOT THINK HE IS IN ANY CONDITION TO GO HOME   GREATLY APPRECIATE PRIMARY TEAM, PLASTICS AND GI HELP HERE!  Dr. Megan Salon will be available should any questions arise the next few days.   LOS: 7 days   Alcide Evener 07/25/2015, 3:08 PM

## 2015-07-25 NOTE — Progress Notes (Signed)
[facility] PROGRESS NOTE  Miguel Campos [id number] DOB: Feb 07, 1971 DOA: 07/18/2015 PCP: Isaias Cowman, PA-C Interim summary: Miguel Campos is a 44 y.o. male PMH of HIV/AIDS, non adherent to treatment, recently admitted for PCP pneumonia (06/2014) discharged on HAART, TPM-SMX, Azithromycin (but did not take meds after discharge) presented with nausea, recurrent diarrhea, progressive generalized weakness, tiredness Assessment/Plan: #1 healthcare associated pneumonia:  Improved, off oxygen and completed IV antibiotics and is on MAC prophylaxis.  Po azithromycin changed to IV azithromycin as pt's is having trouble swallowing.  He remains afebrile and WBC count at 2.8.   #2 hypotension Likely secondary to hypovolemia and infectious etiology. Patient mentating well. Systolic blood pressures in the mid-high 90s. IV fluids. Also given patient IV albumin. Patient noted to have a hemoglobin of 7.3 on 07/20/2015. Patient is status post 2 units packed red blood cells with improvement with hypotension. Hemoglobin has improved to 10, but suspect he is also hemoconcentrated, as he is not eating or drinking much.  So we started him on gentle hydration with dextrose NS fluids.   #3 oral thrush Oral diflucan changed to IV diflucan as he is having trouble swallowing pills.   #4 MAI Continue azithromycin, ethambutol per ID recommendations.Rifabutin has been d/c'd per ID.  #5 AIDS Patient's CD4 less than 10 on 06/20/2015. Viral load is 71700 from 06/20/2015. Continue ART therapy per ID. ID following and appreciate input and recommendations. Skin lesions to be biopsied, Plastic surgery consulted, but patient refused.  Offered to call ENT to get palate biopsy, but he has been refusing any biopsies so far.  Mom is at bedside and is aware. She has convinced him to agree for an EGD.   #6 severe protein calorie malnutrition Patient has been started on Megace. Nutritional supplementation.BUT earlier today  he became hypoglycemic and we had to start him on dextrose fluids till he can take po .   #7 diarrhea Improved. Stool studies negative to date. GI panel negative. C. difficile PCR negative. Stool ova and parasites negative. AFB culture with smear on stool positive. Patient on azithromycin, rifampin, ethambutol for MAI per ID. ID following.  #8 history of CHF/cardiomyopathy Prior 2-D echo with EF of 30-35%. Patient dehydrated and hypovolemic. Continue IV fluids. Beta blocker on hold secondary to hypotension. Follow.  #9 pancytopenia Likely secondary to AIDS... Patient denies any overt bleeding. Completed IV antibiotics. S/P 2 units of packed red blood cells secondary to drop his hemoglobin and his hypotension. Hemoglobin currently at 10.1.  Follow.  #10 UTI cokmpleted the course of IV antibiotics.   #11 hypokalemia/hypomagnesemia Replete as needed. Repeat in am.   #12 prognosis  Patient with AIDS and has been noncompliant with his medications. Patient presented for the second time to this hospital over the past 2 months and has been admitted 6 times over the past 2 months. Patient with failure to thrive. Patient is cachectic and deteriorating. He has been refusing medications and blood draws and tests intermittently.  Palliative care consulted and pt refused to participate in goals of care.  Psychiatry consulted to evaluate for capacity for evaluation. He was deemed to have the insight and capacity to make his own medical decisions.   Code Status: Full Family Communication: mom at bedside. Disposition Plan: mom wants to take him home after the EGD.    Consultants:  Infectious diseases: Dr. Johnnye Sima 07/18/2015  PALLIATIVE care services  Psychiatry Dr Lenna Sciara  Gastroenterology Velora Heckler.   Plastic surgery Dr Marla Roe. On 12/22  Procedures:  Chest  x-ray 07/18/2015  Abdominal films 07/19/2015  2 units packed red blood cells 07/20/2015  Antibiotics:  Oral azithromycin to IV  azithromycin.  Ethambutol 07/18/2015  IV Diflucan 07/18/2015  IV Zosyn 07/18/2015  To 12/22  IV vancomycin 07/18/2015>>>>07/21/2015  Oral Bactrim 07/18/2015  Rifabutin 07/18/2015 d/ced.  HPI/Subjective: Mom at bedside. Lethargic from poor po intake and hypoglycemia 1 amp of dextrose 50% GIVEN and his cbg improved to 140's.   Objective: Filed Vitals:   07/25/15 1645 07/25/15 1700  BP: 125/89 123/88  Pulse:  89  Temp:  98.5 F (36.9 C)  Resp:  20    Intake/Output Summary (Last 24 hours) at 07/25/15 1726 Last data filed at 07/25/15 0900  Gross per 24 hour  Intake 2662.5 ml  Output      1 ml  Net 2661.5 ml   Filed Weights   07/21/15 2059 07/22/15 2011 07/23/15 2100  Weight: 68.493 kg (151 lb) 52.617 kg (116 lb) 53.1 kg (117 lb 1 oz)    Exam:   General:  NAD. Cachetic. Frail  Cardiovascular: Regular rate rhythm  Respiratory: Clear to auscultation bilaterally anterior lung fields.  Abdomen: Soft, nontender, nondistended, positive bowel sounds.  Musculoskeletal: No clubbing or cyanosis. Bilateral pedal edema.  Skin:  Possible. KS lesion on the leg   Data Reviewed: Basic Metabolic Panel:  Recent Labs Lab 07/19/15 0945 07/20/15 0439 07/22/15 0955 07/23/15 1110 07/25/15 0944 07/25/15 1200  NA 139 140 142 141 141  --   K 3.1* 2.8* 2.4* 3.0* 2.5*  --   CL 115* 120* 116* 113* 110  --   CO2 15* 13* 20* 21* 24  --   GLUCOSE 79 62* 62* 81 49*  --   BUN 41* 28* 21* 22* 17  --   CREATININE 1.34* 1.40* 1.33* 1.34* 1.08  --   CALCIUM 7.0* 7.3* 7.0* 6.8* 7.2*  --   MG 1.6* 2.5* 1.8 2.2  --  1.7   Liver Function Tests:  Recent Labs Lab 07/22/15 0955  AST 36  ALT 16*  ALKPHOS 193*  BILITOT 1.0  PROT 4.1*  ALBUMIN 1.3*   No results for input(s): LIPASE, AMYLASE in the last 168 hours. No results for input(s): AMMONIA in the last 168 hours. CBC:  Recent Labs Lab 07/19/15 0945 07/20/15 0439 07/20/15 2303 07/22/15 0955 07/23/15 1110 07/25/15 0944   WBC 2.7* 2.9*  --  3.3* 2.8* 2.8*  NEUTROABS  --  2.6  --   --   --   --   HGB 9.4* 7.3* 9.1* 10.9* 9.4* 10.1*  HCT 30.1* 22.6* 27.5* 32.2* 27.9* 30.0*  MCV 76.8* 74.8*  --  75.2* 75.2* 76.5*  PLT 82* 78*  --  55* 42* 44*   Cardiac Enzymes: No results for input(s): CKTOTAL, CKMB, CKMBINDEX, TROPONINI in the last 168 hours. BNP (last 3 results)  Recent Labs  06/20/15 2002  BNP 243.8*    ProBNP (last 3 results) No results for input(s): PROBNP in the last 8760 hours.  CBG: No results for input(s): GLUCAP in the last 168 hours.  Recent Results (from the past 240 hour(s))  Culture, blood (Routine X 2) w Reflex to ID Panel     Status: None   Collection Time: 07/18/15 11:30 AM  Result Value Ref Range Status   Specimen Description BLOOD RIGHT ANTECUBITAL  Final   Special Requests BOTTLES DRAWN AEROBIC ONLY 5CC  Final   Culture NO GROWTH 5 DAYS  Final   Report Status 07/23/2015 FINAL  Final  Culture, blood (Routine X 2) w Reflex to ID Panel     Status: None   Collection Time: 07/18/15 11:45 AM  Result Value Ref Range Status   Specimen Description BLOOD LEFT ANTECUBITAL  Final   Special Requests BOTTLES DRAWN AEROBIC ONLY 5CC  Final   Culture NO GROWTH 5 DAYS  Final   Report Status 07/23/2015 FINAL  Final  Gastrointestinal Panel by PCR , Stool     Status: None   Collection Time: 07/18/15  3:44 PM  Result Value Ref Range Status   Campylobacter species NOT DETECTED NOT DETECTED Final   Plesimonas shigelloides NOT DETECTED NOT DETECTED Final   Salmonella species NOT DETECTED NOT DETECTED Final   Yersinia enterocolitica NOT DETECTED NOT DETECTED Final   Vibrio species NOT DETECTED NOT DETECTED Final   Vibrio cholerae NOT DETECTED NOT DETECTED Final   Enteroaggregative E coli (EAEC) NOT DETECTED NOT DETECTED Final   Enteropathogenic E coli (EPEC) NOT DETECTED NOT DETECTED Final   Enterotoxigenic E coli (ETEC) NOT DETECTED NOT DETECTED Final   Shiga like toxin producing E coli  (STEC) NOT DETECTED NOT DETECTED Final   E. coli O157 NOT DETECTED NOT DETECTED Final   Shigella/Enteroinvasive E coli (EIEC) NOT DETECTED NOT DETECTED Final   Cryptosporidium NOT DETECTED NOT DETECTED Final   Cyclospora cayetanensis NOT DETECTED NOT DETECTED Final   Entamoeba histolytica NOT DETECTED NOT DETECTED Final   Giardia lamblia NOT DETECTED NOT DETECTED Final   Adenovirus F40/41 NOT DETECTED NOT DETECTED Final   Astrovirus NOT DETECTED NOT DETECTED Final   Norovirus GI/GII NOT DETECTED NOT DETECTED Final   Rotavirus A NOT DETECTED NOT DETECTED Final   Sapovirus (I, II, IV, and V) NOT DETECTED NOT DETECTED Final  OVA + PARASITE EXAM     Status: None   Collection Time: 07/18/15  3:44 PM  Result Value Ref Range Status   OVA + PARASITE EXAM Final report  Final    Comment: (NOTE) These results were obtained using wet preparation(s) and trichrome stained smear. This test does not include testing for Cryptosporidium parvum, Cyclospora, or Microsporidia. Performed At: [facility] [place], [place] [id number] Lindon Romp MD [id number]    Source of Sample STOOL  Final  AFB culture with smear     Status: None (Preliminary result)   Collection Time: 07/18/15  3:44 PM  Result Value Ref Range Status   Specimen Description STOOL  Final   Special Requests Immunocompromised  Final   Acid Fast Smear   Final    7183 3+ ACID FAST BACILLI SEEN CRITICAL RESULT CALLED TO, READ BACK BY AND VERIFIED WITH: ALBERT 14:45 08/18/14 FUENG FAXED TO 832 CRITICAL RESULT CALLED TO, READ BACK BY AND VERIFIED WITH: SUSAN CARDWELL AT H.D.10:05 07/24/15 [place] Performed at [facility]    Culture   Final    CULTURE WILL BE EXAMINED FOR 6 WEEKS BEFORE ISSUING A FINAL REPORT Performed at [facility]    Report Status PENDING  Incomplete  C difficile quick scan w PCR reflex     Status: None   Collection Time: 07/19/15  1:43 PM  Result Value Ref Range Status   C  Diff antigen NEGATIVE NEGATIVE Final   C Diff toxin NEGATIVE NEGATIVE Final   C Diff interpretation Negative for toxigenic C. difficile  Final     Studies: No results found.  Scheduled Meds: . antiseptic oral rinse  7 mL Mouth Rinse BID  .  azithromycin  500 mg Intravenous Q24H  . dolutegravir  50 mg Oral Daily  . dronabinol  2.5 mg Oral BID AC  . emtricitabine-tenofovir AF  1 tablet Oral Daily  . ethambutol  15 mg/kg Oral Daily  . feeding supplement  1 Container Oral TID WC  . feeding supplement (ENSURE ENLIVE)  237 mL Oral BID BM  . [START ON 07/26/2015] fluconazole (DIFLUCAN) IV  100 mg Intravenous Q24H  . folic acid  1 mg Oral Daily  . magnesium sulfate 1 - 4 g bolus IVPB  2 g Intravenous Once  . megestrol  400 mg Oral Daily  . nicotine  21 mg Transdermal Daily  . pantoprazole (PROTONIX) IV  40 mg Intravenous Q24H  . potassium chloride  40 mEq Oral BID  . potassium chloride  10 mEq Intravenous Q1 Hr x 4  . sodium chloride  3 mL Intravenous Q12H  . [START ON 07/26/2015] sulfamethoxazole-trimethoprim  20 mL Oral Daily   Continuous Infusions: . dextrose 5 % and 0.9% NaCl 50 mL/hr at 07/25/15 1721  .  sodium bicarbonate 150 mEq in sterile water 1000 mL infusion 75 mL/hr at 07/25/15 1205    Principal Problem:   HCAP (healthcare-associated pneumonia) Active Problems:   AIDS ([place])   Tobacco abuse disorder   Arterial hypotension   Anemia due to bone marrow failure (HCC)   Abdominal pain, chronic, epigastric   HIV disease ([place])   Chronic combined systolic and diastolic congestive heart failure (HCC)   Protein-calorie malnutrition (HCC)   Cardiomyopathy ([place])   Weight loss   Diarrhea   UTI (urinary tract infection)   Oral thrush   Dehydration   Orthostatic hypotension   Cachexia ([place])   Kaposi's disease   Mycobacterial infection, non-TB   Palliative care encounter   Hypokalemia   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 If 7PM-7AM, please contact night-coverage at [email or web address], password [facility] 07/25/2015, 5:26 PM  LOS: 7 days

## 2015-07-26 DIAGNOSIS — L8990 Pressure ulcer of unspecified site, unspecified stage: Secondary | ICD-10-CM | POA: Insufficient documentation

## 2015-07-26 MED ORDER — POTASSIUM CHLORIDE 20 MEQ/15ML (10%) PO SOLN
40.0000 meq | Freq: Two times a day (BID) | ORAL | Status: DC
  Administered 2015-07-26 – 2015-07-29 (×5): 40 meq via ORAL
  Administered 2015-07-29: 7.5 meq via ORAL
  Filled 2015-07-26 (×11): qty 30

## 2015-07-26 NOTE — Progress Notes (Signed)
[facility] PROGRESS NOTE  Miguel Campos [id number] DOB: 12-18-1970 DOA: 07/18/2015 PCP: Isaias Cowman, PA-C Interim summary: Miguel Campos is a 44 y.o. male PMH of HIV/AIDS, non adherent to treatment, recently admitted for PCP pneumonia (06/2014) discharged on HAART, TPM-SMX, Azithromycin (but did not take meds after discharge) presented with nausea, recurrent diarrhea, progressive generalized weakness, tiredness. He was treated for HCAP.  Assessment/Plan: #1 healthcare associated pneumonia:  Improved, off oxygen and completed IV antibiotics and is on MAC prophylaxis.  Po azithromycin changed to IV azithromycin as pt's is having trouble swallowing.  He remains afebrile and WBC count at 2.8.   #2 hypotension Likely secondary to hypovolemia and infectious etiology. Patient mentating well. Systolic blood pressures in the mid-high 90s. IV fluids. Also given patient IV albumin. Patient noted to have a hemoglobin of 7.3 on 07/20/2015. Patient is status post 2 units packed red blood cells with improvement with hypotension. Hemoglobin has improved to 10, but suspect he is also hemoconcentrated, as he is not eating or drinking much.  So we started him on gentle hydration with dextrose NS fluids.   #3 oral thrush Oral diflucan changed to IV diflucan as he is having trouble swallowing pills.   #4 MAI Continue azithromycin, ethambutol per ID recommendations.Rifabutin has been d/c'd per ID.  #5 AIDS Patient's CD4 less than 10 on 06/20/2015. Viral load is 71700 from 06/20/2015. Continue ART therapy per ID. ID following and appreciate input and recommendations. Skin lesions to be biopsied, Plastic surgery consulted, but patient refused.  Offered to call ENT to get palate biopsy, but he has been refusing any biopsies so far.  Mom is at bedside and is aware. She has convinced him to agree for an EGD, which will take place on Tuesday.   #6 severe protein calorie malnutrition Patient has been  started on Megace. Nutritional supplementation.BUT earlier today he became hypoglycemic and we had to start him on dextrose fluids till he can take po adequately.   #7 diarrhea Improved. Stool studies negative to date. GI panel negative. C. difficile PCR negative. Stool ova and parasites negative. AFB culture with smear on stool positive. Patient on azithromycin, rifampin, ethambutol for MAI per ID. ID following.  #8 history of CHF/cardiomyopathy Prior 2-D echo with EF of 30-35%. Patient dehydrated and hypovolemic. Continue IV fluids. Beta blocker on hold secondary to hypotension. Follow.  #9 pancytopenia Likely secondary to AIDS... Patient denies any overt bleeding. Completed IV antibiotics. S/P 2 units of packed red blood cells secondary to drop his hemoglobin and his hypotension. Hemoglobin currently at 10.1.  Follow.  #10 UTI cokmpleted the course of IV antibiotics.   #11 hypokalemia/hypomagnesemia Replete as needed. Repeat in am.   #12 prognosis  Patient with AIDS and has been noncompliant with his medications. Patient presented for the second time to this hospital over the past 2 months and has been admitted 6 times over the past 2 months. Patient with failure to thrive. Patient is cachectic and deteriorating. He has been refusing medications and blood draws and tests intermittently.  Palliative care consulted and pt refused to participate in goals of care.  Psychiatry consulted to evaluate for capacity for evaluation. He was deemed to have the insight and capacity to make his own medical decisions.   Code Status: Full Family Communication: family at bedside. Disposition Plan: mom wants to take him home after the EGD.    Consultants:  Infectious diseases: Dr. Johnnye Sima 07/18/2015  PALLIATIVE care services  Psychiatry Dr Lenna Sciara  Gastroenterology Velora Heckler.  Plastic surgery Dr Marla Roe. On 12/22  Procedures:  Chest x-ray 07/18/2015  Abdominal films 07/19/2015  2 units  packed red blood cells 07/20/2015  Antibiotics:  Oral azithromycin to IV azithromycin.  Ethambutol 07/18/2015  IV Diflucan 07/18/2015  IV Zosyn 07/18/2015  To 12/22  IV vancomycin 07/18/2015>>>>07/21/2015  Oral Bactrim 07/18/2015  Rifabutin 07/18/2015 d/ced.  HPI/Subjective: He reports feeling better, ate some today. But very sleepy.   Objective: Filed Vitals:   07/26/15 0932 07/26/15 1824  BP: 114/84 122/70  Pulse: 124 91  Temp: 100 F (37.8 C) 98.2 F (36.8 C)  Resp: 18 18    Intake/Output Summary (Last 24 hours) at 07/26/15 1841 Last data filed at 07/26/15 1348  Gross per 24 hour  Intake 2798.67 ml  Output      0 ml  Net 2798.67 ml   Filed Weights   07/21/15 2059 07/22/15 2011 07/23/15 2100  Weight: 68.493 kg (151 lb) 52.617 kg (116 lb) 53.1 kg (117 lb 1 oz)    Exam:   General:  NAD. Cachetic. Frail  Cardiovascular: Regular rate rhythm  Respiratory: Clear to auscultation bilaterally anterior lung fields.  Abdomen: Soft, nontender, nondistended, positive bowel sounds.  Musculoskeletal: No clubbing or cyanosis. Bilateral pedal edema.  Skin:  Possible. KS lesion on the leg   Data Reviewed: Basic Metabolic Panel:  Recent Labs Lab 07/20/15 0439 07/22/15 0955 07/23/15 1110 07/25/15 0944 07/25/15 1200 07/25/15 1944  NA 140 142 141 141  --   --   K 2.8* 2.4* 3.0* 2.5*  --  3.1*  CL 120* 116* 113* 110  --   --   CO2 13* 20* 21* 24  --   --   GLUCOSE 62* 62* 81 49*  --   --   BUN 28* 21* 22* 17  --   --   CREATININE 1.40* 1.33* 1.34* 1.08  --   --   CALCIUM 7.3* 7.0* 6.8* 7.2*  --   --   MG 2.5* 1.8 2.2  --  1.7  --    Liver Function Tests:  Recent Labs Lab 07/22/15 0955  AST 36  ALT 16*  ALKPHOS 193*  BILITOT 1.0  PROT 4.1*  ALBUMIN 1.3*   No results for input(s): LIPASE, AMYLASE in the last 168 hours. No results for input(s): AMMONIA in the last 168 hours. CBC:  Recent Labs Lab 07/20/15 0439 07/20/15 2303 07/22/15 0955  07/23/15 1110 07/25/15 0944  WBC 2.9*  --  3.3* 2.8* 2.8*  NEUTROABS 2.6  --   --   --   --   HGB 7.3* 9.1* 10.9* 9.4* 10.1*  HCT 22.6* 27.5* 32.2* 27.9* 30.0*  MCV 74.8*  --  75.2* 75.2* 76.5*  PLT 78*  --  55* 42* 44*   Cardiac Enzymes: No results for input(s): CKTOTAL, CKMB, CKMBINDEX, TROPONINI in the last 168 hours. BNP (last 3 results)  Recent Labs  06/20/15 2002  BNP 243.8*    ProBNP (last 3 results) No results for input(s): PROBNP in the last 8760 hours.  CBG:  Recent Labs Lab 07/25/15 1646 07/25/15 1656 07/25/15 2129  GLUCAP 17* 148* 94    Recent Results (from the past 240 hour(s))  Culture, blood (Routine X 2) w Reflex to ID Panel     Status: None   Collection Time: 07/18/15 11:30 AM  Result Value Ref Range Status   Specimen Description BLOOD RIGHT ANTECUBITAL  Final   Special Requests BOTTLES DRAWN AEROBIC ONLY 5CC  Final  Culture NO GROWTH 5 DAYS  Final   Report Status 07/23/2015 FINAL  Final  Culture, blood (Routine X 2) w Reflex to ID Panel     Status: None   Collection Time: 07/18/15 11:45 AM  Result Value Ref Range Status   Specimen Description BLOOD LEFT ANTECUBITAL  Final   Special Requests BOTTLES DRAWN AEROBIC ONLY 5CC  Final   Culture NO GROWTH 5 DAYS  Final   Report Status 07/23/2015 FINAL  Final  Gastrointestinal Panel by PCR , Stool     Status: None   Collection Time: 07/18/15  3:44 PM  Result Value Ref Range Status   Campylobacter species NOT DETECTED NOT DETECTED Final   Plesimonas shigelloides NOT DETECTED NOT DETECTED Final   Salmonella species NOT DETECTED NOT DETECTED Final   Yersinia enterocolitica NOT DETECTED NOT DETECTED Final   Vibrio species NOT DETECTED NOT DETECTED Final   Vibrio cholerae NOT DETECTED NOT DETECTED Final   Enteroaggregative E coli (EAEC) NOT DETECTED NOT DETECTED Final   Enteropathogenic E coli (EPEC) NOT DETECTED NOT DETECTED Final   Enterotoxigenic E coli (ETEC) NOT DETECTED NOT DETECTED Final   Shiga  like toxin producing E coli (STEC) NOT DETECTED NOT DETECTED Final   E. coli O157 NOT DETECTED NOT DETECTED Final   Shigella/Enteroinvasive E coli (EIEC) NOT DETECTED NOT DETECTED Final   Cryptosporidium NOT DETECTED NOT DETECTED Final   Cyclospora cayetanensis NOT DETECTED NOT DETECTED Final   Entamoeba histolytica NOT DETECTED NOT DETECTED Final   Giardia lamblia NOT DETECTED NOT DETECTED Final   Adenovirus F40/41 NOT DETECTED NOT DETECTED Final   Astrovirus NOT DETECTED NOT DETECTED Final   Norovirus GI/GII NOT DETECTED NOT DETECTED Final   Rotavirus A NOT DETECTED NOT DETECTED Final   Sapovirus (I, II, IV, and V) NOT DETECTED NOT DETECTED Final  OVA + PARASITE EXAM     Status: None   Collection Time: 07/18/15  3:44 PM  Result Value Ref Range Status   OVA + PARASITE EXAM Final report  Final    Comment: (NOTE) These results were obtained using wet preparation(s) and trichrome stained smear. This test does not include testing for Cryptosporidium parvum, Cyclospora, or Microsporidia. Performed At: [facility] [place], [place] [id number] Lindon Romp MD [id number]    Source of Sample STOOL  Final  AFB culture with smear     Status: None (Preliminary result)   Collection Time: 07/18/15  3:44 PM  Result Value Ref Range Status   Specimen Description STOOL  Final   Special Requests Immunocompromised  Final   Acid Fast Smear   Final    7183 3+ ACID FAST BACILLI SEEN CRITICAL RESULT CALLED TO, READ BACK BY AND VERIFIED WITH: ALBERT 14:45 08/18/14 FUENG FAXED TO 832 CRITICAL RESULT CALLED TO, READ BACK BY AND VERIFIED WITH: SUSAN CARDWELL AT H.D.10:05 07/24/15 [place] Performed at [facility]    Culture   Final    CULTURE WILL BE EXAMINED FOR 6 WEEKS BEFORE ISSUING A FINAL REPORT Performed at [facility]    Report Status PENDING  Incomplete  C difficile quick scan w PCR reflex     Status: None   Collection Time: 07/19/15  1:43 PM  Result  Value Ref Range Status   C Diff antigen NEGATIVE NEGATIVE Final   C Diff toxin NEGATIVE NEGATIVE Final   C Diff interpretation Negative for toxigenic C. difficile  Final     Studies: No results found.  Scheduled Meds: . antiseptic oral rinse  7 mL Mouth Rinse BID  . azithromycin  500 mg Intravenous Q24H  . dolutegravir  50 mg Oral Daily  . dronabinol  2.5 mg Oral BID AC  . emtricitabine-tenofovir AF  1 tablet Oral Daily  . ethambutol  15 mg/kg Oral Daily  . feeding supplement  1 Container Oral TID WC  . feeding supplement (ENSURE ENLIVE)  237 mL Oral BID BM  . fluconazole (DIFLUCAN) IV  100 mg Intravenous Q24H  . folic acid  1 mg Oral Daily  . megestrol  400 mg Oral Daily  . nicotine  21 mg Transdermal Daily  . pantoprazole (PROTONIX) IV  40 mg Intravenous Q24H  . potassium chloride  40 mEq Oral BID  . sodium chloride  3 mL Intravenous Q12H  . sulfamethoxazole-trimethoprim  20 mL Oral Daily   Continuous Infusions: . dextrose 5 % and 0.9% NaCl 50 mL/hr at 07/25/15 1721    Principal Problem:   HCAP (healthcare-associated pneumonia) Active Problems:   AIDS (HCC)   Tobacco abuse disorder   Arterial hypotension   Anemia due to bone marrow failure (HCC)   Abdominal pain, chronic, epigastric   HIV disease (HCC)   Chronic combined systolic and diastolic congestive heart failure (HCC)   Protein-calorie malnutrition (HCC)   Cardiomyopathy (HCC)   Weight loss   Diarrhea   UTI (urinary tract infection)   Oral thrush   Dehydration   Orthostatic hypotension   Cachexia ([place])   Kaposi's disease   Mycobacterial infection, non-TB   Palliative care encounter   Hypokalemia   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 If 7PM-7AM, please contact night-coverage at [email or web address], password  [facility] 07/26/2015, 6:41 PM  LOS: 8 days

## 2015-07-27 ENCOUNTER — Inpatient Hospital Stay (HOSPITAL_COMMUNITY): Payer: 59

## 2015-07-27 LAB — BASIC METABOLIC PANEL
ANION GAP: 5 (ref 5–15)
BUN: 14 mg/dL (ref 6–20)
CHLORIDE: 107 mmol/L (ref 101–111)
CO2: 24 mmol/L (ref 22–32)
Calcium: 6.6 mg/dL — ABNORMAL LOW (ref 8.9–10.3)
Creatinine, Ser: 0.92 mg/dL (ref 0.61–1.24)
Glucose, Bld: 81 mg/dL (ref 65–99)
POTASSIUM: 2.6 mmol/L — AB (ref 3.5–5.1)
SODIUM: 136 mmol/L (ref 135–145)

## 2015-07-27 LAB — CBC
HCT: 23.2 % — ABNORMAL LOW (ref 39.0–52.0)
HEMOGLOBIN: 7.5 g/dL — AB (ref 13.0–17.0)
MCH: 25.5 pg — ABNORMAL LOW (ref 26.0–34.0)
MCHC: 32.3 g/dL (ref 30.0–36.0)
MCV: 78.9 fL (ref 78.0–100.0)
Platelets: 29 10*3/uL — CL (ref 150–400)
RBC: 2.94 MIL/uL — AB (ref 4.22–5.81)
RDW: 19 % — ABNORMAL HIGH (ref 11.5–15.5)
WBC: 1.7 10*3/uL — AB (ref 4.0–10.5)

## 2015-07-27 LAB — MAGNESIUM: Magnesium: 1.5 mg/dL — ABNORMAL LOW (ref 1.7–2.4)

## 2015-07-27 MED ORDER — POTASSIUM CHLORIDE 10 MEQ/100ML IV SOLN
10.0000 meq | INTRAVENOUS | Status: AC
  Administered 2015-07-27 (×2): 10 meq via INTRAVENOUS
  Filled 2015-07-27: qty 100

## 2015-07-27 MED ORDER — FLUCONAZOLE IN SODIUM CHLORIDE 100-0.9 MG/50ML-% IV SOLN
100.0000 mg | INTRAVENOUS | Status: DC
  Administered 2015-07-28 – 2015-07-30 (×3): 100 mg via INTRAVENOUS
  Filled 2015-07-27 (×6): qty 50

## 2015-07-27 MED ORDER — DEXTROSE 5 % IV SOLN
500.0000 mg | INTRAVENOUS | Status: DC
  Administered 2015-07-27 – 2015-07-30 (×4): 500 mg via INTRAVENOUS
  Filled 2015-07-27 (×6): qty 500

## 2015-07-27 MED ORDER — MAGNESIUM SULFATE 4 GM/100ML IV SOLN
4.0000 g | Freq: Once | INTRAVENOUS | Status: AC
  Administered 2015-07-27: 4 g via INTRAVENOUS
  Filled 2015-07-27: qty 100

## 2015-07-27 MED ORDER — POTASSIUM CHLORIDE 10 MEQ/100ML IV SOLN
10.0000 meq | INTRAVENOUS | Status: AC
  Administered 2015-07-27: 10 meq via INTRAVENOUS
  Filled 2015-07-27 (×2): qty 100

## 2015-07-27 NOTE — Progress Notes (Signed)
[facility] PROGRESS NOTE  Macus Dille [id number] DOB: 11/05/1970 DOA: 07/18/2015 PCP: Isaias Cowman, PA-C Interim summary: Thadius Schmal is a 44 y.o. male PMH of HIV/AIDS, non adherent to treatment, recently admitted for PCP pneumonia (06/2014) discharged on HAART, TPM-SMX, Azithromycin (but did not take meds after discharge) presented with nausea, recurrent diarrhea, progressive generalized weakness, tiredness. He was treated for HCAP.  Assessment/Plan: #1 healthcare associated pneumonia:  Improved, off oxygen and completed IV antibiotics and is on MAC prophylaxis.  Po azithromycin changed to IV azithromycin as pt's is having trouble swallowing.  He remains afebrile and WBC count at 2.8.   #2 hypotension Likely secondary to hypovolemia and infectious etiology. Patient mentating well. Systolic blood pressures in the mid-high 90s. IV fluids. Also given patient IV albumin. Patient noted to have a hemoglobin of 7.3 on 07/20/2015. Patient is status post 2 units packed red blood cells with improvement with hypotension. Hemoglobin has improved to 10, but suspect he is also hemoconcentrated, as he is not eating or drinking much.  So we started him on gentle hydration with dextrose NS fluids.   #3 oral thrush Oral diflucan changed to IV diflucan as he is having trouble swallowing pills.   #4 MAI Continue azithromycin, ethambutol per ID recommendations.Rifabutin has been d/c'd per ID.  #5 AIDS Patient's CD4 less than 10 on 06/20/2015. Viral load is 71700 from 06/20/2015. Continue ART therapy per ID. ID following and appreciate input and recommendations. Skin lesions to be biopsied, Plastic surgery consulted, but patient refused.  Offered to call ENT to get palate biopsy, but he has been refusing any biopsies so far.  Mom is at bedside and is aware. She has convinced him to agree for an EGD, which will take place on Tuesday.   #6 severe protein calorie malnutrition Patient has been  started on Megace. Nutritional supplementation.BUT earlier today he became hypoglycemic and we had to start him on dextrose fluids till he can take po adequately.   #7 diarrhea Improved. Stool studies negative to date. GI panel negative. C. difficile PCR negative. Stool ova and parasites negative. AFB culture with smear on stool positive. Patient on azithromycin, rifampin, ethambutol for MAI per ID. ID following.  #8 history of CHF/cardiomyopathy Prior 2-D echo with EF of 30-35%. Patient dehydrated and hypovolemic. Continue IV fluids. Beta blocker on hold secondary to hypotension. Follow.  #9 pancytopenia Likely secondary to AIDS... Patient denies any overt bleeding. Completed IV antibiotics. S/P 2 units of packed red blood cells secondary to drop his hemoglobin and his hypotension. Hemoglobin currently at 10.1.  Follow.  #10 UTI cokmpleted the course of IV antibiotics.   #11 hypokalemia/hypomagnesemia Replete as needed. Repeat in am.   #12 prognosis  Patient with AIDS and has been noncompliant with his medications. Patient presented for the second time to this hospital over the past 2 months and has been admitted 6 times over the past 2 months. Patient with failure to thrive. Patient is cachectic and deteriorating. He has been refusing medications and blood draws and tests intermittently.  Palliative care consulted and pt refused to participate in goals of care.  Psychiatry consulted to evaluate for capacity for evaluation. He was deemed to have the insight and capacity to make his own medical decisions.  #13 ? He slid from the chair ? fall ; CT head and x rays of the hip and pelvis ordered. No pain , no bruise seen.negative exam.    Code Status: Full Family Communication: family at bedside. Disposition Plan:  mom wants to take him home after the EGD.    Consultants:  Infectious diseases: Dr. Johnnye Sima 07/18/2015  PALLIATIVE care services  Psychiatry Dr Lenna Sciara  Gastroenterology  Velora Heckler.   Plastic surgery Dr Marla Roe. On 12/22  Procedures:  Chest x-ray 07/18/2015  Abdominal films 07/19/2015  2 units packed red blood cells 07/20/2015  Antibiotics:  Oral azithromycin to IV azithromycin.  Ethambutol 07/18/2015  IV Diflucan 07/18/2015  IV Zosyn 07/18/2015  To 12/22  IV vancomycin 07/18/2015>>>>07/21/2015  Oral Bactrim 07/18/2015  Rifabutin 07/18/2015 d/ced.  HPI/Subjective: He reports feeling better, ate some today. But very sleepy.   Objective: Filed Vitals:   07/27/15 1253 07/27/15 1500  BP: 110/78   Pulse: 108   Temp: 98.1 F (36.7 C) 98 F (36.7 C)  Resp: 20     Intake/Output Summary (Last 24 hours) at 07/27/15 1553 Last data filed at 07/27/15 1300  Gross per 24 hour  Intake    480 ml  Output    251 ml  Net    229 ml   Filed Weights   07/22/15 2011 07/23/15 2100 07/26/15 2100  Weight: 52.617 kg (116 lb) 53.1 kg (117 lb 1 oz) 52.3 kg (115 lb 4.8 oz)    Exam:   General:  NAD. Cachetic. Frail  Cardiovascular: Regular rate rhythm  Respiratory: Clear to auscultation bilaterally anterior lung fields.  Abdomen: Soft, nontender, nondistended, positive bowel sounds.  Musculoskeletal: No clubbing or cyanosis. Bilateral pedal edema.  Skin:  Possible. KS lesion on the leg   Data Reviewed: Basic Metabolic Panel:  Recent Labs Lab 07/22/15 0955 07/23/15 1110 07/25/15 0944 07/25/15 1200 07/25/15 1944 07/27/15 0534 07/27/15 1200  NA 142 141 141  --   --  136  --   K 2.4* 3.0* 2.5*  --  3.1* 2.6*  --   CL 116* 113* 110  --   --  107  --   CO2 20* 21* 24  --   --  24  --   GLUCOSE 62* 81 49*  --   --  81  --   BUN 21* 22* 17  --   --  14  --   CREATININE 1.33* 1.34* 1.08  --   --  0.92  --   CALCIUM 7.0* 6.8* 7.2*  --   --  6.6*  --   MG 1.8 2.2  --  1.7  --   --  1.5*   Liver Function Tests:  Recent Labs Lab 07/22/15 0955  AST 36  ALT 16*  ALKPHOS 193*  BILITOT 1.0  PROT 4.1*  ALBUMIN 1.3*   No results  for input(s): LIPASE, AMYLASE in the last 168 hours. No results for input(s): AMMONIA in the last 168 hours. CBC:  Recent Labs Lab 07/20/15 2303 07/22/15 0955 07/23/15 1110 07/25/15 0944 07/27/15 0534  WBC  --  3.3* 2.8* 2.8* 1.7*  HGB 9.1* 10.9* 9.4* 10.1* 7.5*  HCT 27.5* 32.2* 27.9* 30.0* 23.2*  MCV  --  75.2* 75.2* 76.5* 78.9  PLT  --  55* 42* 44* 29*   Cardiac Enzymes: No results for input(s): CKTOTAL, CKMB, CKMBINDEX, TROPONINI in the last 168 hours. BNP (last 3 results)  Recent Labs  06/20/15 2002  BNP 243.8*    ProBNP (last 3 results) No results for input(s): PROBNP in the last 8760 hours.  CBG:  Recent Labs Lab 07/25/15 1646 07/25/15 1656 07/25/15 2129  GLUCAP 17* 148* 94    Recent Results (from the past 240  hour(s))  Culture, blood (Routine X 2) w Reflex to ID Panel     Status: None   Collection Time: 07/18/15 11:30 AM  Result Value Ref Range Status   Specimen Description BLOOD RIGHT ANTECUBITAL  Final   Special Requests BOTTLES DRAWN AEROBIC ONLY 5CC  Final   Culture NO GROWTH 5 DAYS  Final   Report Status 07/23/2015 FINAL  Final  Culture, blood (Routine X 2) w Reflex to ID Panel     Status: None   Collection Time: 07/18/15 11:45 AM  Result Value Ref Range Status   Specimen Description BLOOD LEFT ANTECUBITAL  Final   Special Requests BOTTLES DRAWN AEROBIC ONLY 5CC  Final   Culture NO GROWTH 5 DAYS  Final   Report Status 07/23/2015 FINAL  Final  Gastrointestinal Panel by PCR , Stool     Status: None   Collection Time: 07/18/15  3:44 PM  Result Value Ref Range Status   Campylobacter species NOT DETECTED NOT DETECTED Final   Plesimonas shigelloides NOT DETECTED NOT DETECTED Final   Salmonella species NOT DETECTED NOT DETECTED Final   Yersinia enterocolitica NOT DETECTED NOT DETECTED Final   Vibrio species NOT DETECTED NOT DETECTED Final   Vibrio cholerae NOT DETECTED NOT DETECTED Final   Enteroaggregative E coli (EAEC) NOT DETECTED NOT DETECTED  Final   Enteropathogenic E coli (EPEC) NOT DETECTED NOT DETECTED Final   Enterotoxigenic E coli (ETEC) NOT DETECTED NOT DETECTED Final   Shiga like toxin producing E coli (STEC) NOT DETECTED NOT DETECTED Final   E. coli O157 NOT DETECTED NOT DETECTED Final   Shigella/Enteroinvasive E coli (EIEC) NOT DETECTED NOT DETECTED Final   Cryptosporidium NOT DETECTED NOT DETECTED Final   Cyclospora cayetanensis NOT DETECTED NOT DETECTED Final   Entamoeba histolytica NOT DETECTED NOT DETECTED Final   Giardia lamblia NOT DETECTED NOT DETECTED Final   Adenovirus F40/41 NOT DETECTED NOT DETECTED Final   Astrovirus NOT DETECTED NOT DETECTED Final   Norovirus GI/GII NOT DETECTED NOT DETECTED Final   Rotavirus A NOT DETECTED NOT DETECTED Final   Sapovirus (I, II, IV, and V) NOT DETECTED NOT DETECTED Final  OVA + PARASITE EXAM     Status: None   Collection Time: 07/18/15  3:44 PM  Result Value Ref Range Status   OVA + PARASITE EXAM Final report  Final    Comment: (NOTE) These results were obtained using wet preparation(s) and trichrome stained smear. This test does not include testing for Cryptosporidium parvum, Cyclospora, or Microsporidia. Performed At: [facility] [place], [place] [id number] Lindon Romp MD [id number]    Source of Sample STOOL  Final  AFB culture with smear     Status: None (Preliminary result)   Collection Time: 07/18/15  3:44 PM  Result Value Ref Range Status   Specimen Description STOOL  Final   Special Requests Immunocompromised  Final   Acid Fast Smear   Final    7183 3+ ACID FAST BACILLI SEEN CRITICAL RESULT CALLED TO, READ BACK BY AND VERIFIED WITH: ALBERT 14:45 08/18/14 FUENG FAXED TO 832 CRITICAL RESULT CALLED TO, READ BACK BY AND VERIFIED WITH: SUSAN CARDWELL AT H.D.10:05 07/24/15 FUENG Performed at [facility]    Culture   Final    CULTURE WILL BE EXAMINED FOR 6 WEEKS BEFORE ISSUING A FINAL REPORT Performed at [facility]    Report Status PENDING  Incomplete  C difficile quick scan w PCR reflex  Status: None   Collection Time: 07/19/15  1:43 PM  Result Value Ref Range Status   C Diff antigen NEGATIVE NEGATIVE Final   C Diff toxin NEGATIVE NEGATIVE Final   C Diff interpretation Negative for toxigenic C. difficile  Final     Studies: Ct Head Wo Contrast  07/27/2015  CLINICAL DATA:  Fall, history of pneumonia, HIV EXAM: CT HEAD WITHOUT CONTRAST TECHNIQUE: Contiguous axial images were obtained from the base of the skull through the vertex without intravenous contrast. COMPARISON:  None. FINDINGS: No skull fracture is noted. There is mucosal thickening with air-fluid level in right maxillary sinus. The mastoid air cells are unremarkable. Mucosal thickening with partial opacification right ethmoid air cells. No intracranial hemorrhage, mass effect or midline shift. No acute cortical infarction. No mass lesion is noted on this unenhanced scan. The gray and white-matter differentiation is preserved. No hydrocephalus. IMPRESSION: No acute intracranial abnormality. Mucosal thickening with air-fluid level right maxillary sinus. Mucosal thickening with partial opacification right ethmoid air cells. No definite acute cortical infarction. Electronically Signed   By: Lahoma Crocker M.D.   On: 07/27/2015 14:48   Dg Hips Bilat With Pelvis 3-4 Views  07/27/2015  CLINICAL DATA:  Bilateral hip pain for 1 day. No known injury. Initial encounter. EXAM: DG HIP (WITH OR WITHOUT PELVIS) 3-4V BILAT COMPARISON:  None. FINDINGS: There is no evidence of hip fracture or dislocation. There is no evidence of arthropathy or other focal bone abnormality. IMPRESSION: Negative exam. Electronically Signed   By: Inge Rise M.D.   On: 07/27/2015 14:53    Scheduled Meds: . antiseptic oral rinse  7 mL Mouth Rinse BID  . azithromycin  500 mg Intravenous Q24H  . dolutegravir  50 mg Oral Daily  . dronabinol  2.5 mg Oral BID AC  .  emtricitabine-tenofovir AF  1 tablet Oral Daily  . ethambutol  15 mg/kg Oral Daily  . feeding supplement  1 Container Oral TID WC  . feeding supplement (ENSURE ENLIVE)  237 mL Oral BID BM  . fluconazole (DIFLUCAN) IV  100 mg Intravenous Q24H  . folic acid  1 mg Oral Daily  . megestrol  400 mg Oral Daily  . nicotine  21 mg Transdermal Daily  . pantoprazole (PROTONIX) IV  40 mg Intravenous Q24H  . potassium chloride  10 mEq Intravenous Q1 Hr x 2  . potassium chloride  40 mEq Oral BID  . sodium chloride  3 mL Intravenous Q12H  . sulfamethoxazole-trimethoprim  20 mL Oral Daily   Continuous Infusions: . dextrose 5 % and 0.9% NaCl 50 mL/hr at 07/26/15 2147    Principal Problem:   HCAP (healthcare-associated pneumonia) Active Problems:   AIDS ([place])   Tobacco abuse disorder   Arterial hypotension   Anemia due to bone marrow failure (HCC)   Abdominal pain, chronic, epigastric   HIV disease (HCC)   Chronic combined systolic and diastolic congestive heart failure (HCC)   Protein-calorie malnutrition (HCC)   Cardiomyopathy (HCC)   Weight loss   Diarrhea   UTI (urinary tract infection)   Oral thrush   Dehydration   Orthostatic hypotension   Cachexia ([place])   Kaposi's disease   Mycobacterial infection, non-TB   Palliative care encounter   Hypokalemia   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 If 7PM-7AM,  please contact night-coverage at [email or web address], password [facility] 07/27/2015, 3:53 PM  LOS: 9 days

## 2015-07-27 NOTE — Progress Notes (Signed)
CRITICAL LAB VALUES PLATELETS: 29 POTASSIUM: 2.6 On call NP notified. RN will continue to monitor patient.   Ermalinda Memos, RN

## 2015-07-27 NOTE — Progress Notes (Signed)
   07/27/15 1053  What Happened  Was fall witnessed? No  Was patient injured? No  Patient found on floor  Found by Staff-comment  Stated prior activity other (comment) (trying to reachBSC without assist)  Follow Up  MD notified Dr. Karleen Hampshire  Time MD notified 85  Family notified Yes-comment (MOther)  Time family notified 20  Additional tests Yes-comment (CT of pelvis and CT of head)  Progress note created (see row info) Yes  Adult Fall Risk Assessment  Risk Factor Category (scoring not indicated) Fall has occurred during this admission (document High fall risk)  Patient's Fall Risk High Fall Risk (>13 points)  Adult Fall Risk Interventions  Required Bundle Interventions *See Row Information* High fall risk - low, moderate, and high requirements implemented  Additional Interventions Fall risk signage  Fall with Injury Screening  Risk For Fall Injury- See Row Information  F  Intervention(s) for 2 or more risk criteria identified Low Bed  Vitals  Temp 98 F (36.7 C)  Temp Source Oral  BP 111/76 mmHg  BP Location Right Arm  BP Method Automatic  Patient Position (if appropriate) Lying  Pulse Rate (!) 112  Pulse Rate Source Dinamap  Resp (!) 22  Oxygen Therapy  SpO2 98 %  O2 Device Room Air  Neurological  Orientation Level Oriented X4  Cognition Appropriate at baseline  Speech Clear  Pupil Assessment  Yes  L Pupil Shape Round  L Pupil Reaction Brisk  R Hand Grip Weak;Present  L Hand Grip Weak ;Present  RUE Motor Strength 4  LUE Motor Strength 4  RLE Motor Strength 4  LLE Motor Strength 4

## 2015-07-27 NOTE — Progress Notes (Signed)
Patient's mother here at bedside and doesn't want the bed alarm initiated as she will be beside him, told her to inform whenever she is not with him and explained her the rationale for the bed alarm, she understood and said she will inform us whenever she is not at the bedside with the patient.

## 2015-07-27 NOTE — Progress Notes (Signed)
Patient currently is sleeping, no symptoms of headache, nausea, vomiting or any pain noted post fall.

## 2015-07-28 ENCOUNTER — Inpatient Hospital Stay (HOSPITAL_COMMUNITY): Payer: 59

## 2015-07-28 DIAGNOSIS — W19XXXA Unspecified fall, initial encounter: Secondary | ICD-10-CM | POA: Insufficient documentation

## 2015-07-28 DIAGNOSIS — R6884 Jaw pain: Secondary | ICD-10-CM

## 2015-07-28 DIAGNOSIS — R1310 Dysphagia, unspecified: Secondary | ICD-10-CM

## 2015-07-28 DIAGNOSIS — A312 Disseminated mycobacterium avium-intracellulare complex (DMAC): Secondary | ICD-10-CM

## 2015-07-28 DIAGNOSIS — W19XXXD Unspecified fall, subsequent encounter: Secondary | ICD-10-CM

## 2015-07-28 LAB — BASIC METABOLIC PANEL
ANION GAP: 5 (ref 5–15)
BUN: 14 mg/dL (ref 6–20)
CALCIUM: 6.9 mg/dL — AB (ref 8.9–10.3)
CO2: 22 mmol/L (ref 22–32)
Chloride: 110 mmol/L (ref 101–111)
Creatinine, Ser: 0.93 mg/dL (ref 0.61–1.24)
Glucose, Bld: 86 mg/dL (ref 65–99)
POTASSIUM: 3.2 mmol/L — AB (ref 3.5–5.1)
SODIUM: 137 mmol/L (ref 135–145)

## 2015-07-28 LAB — CBC WITH DIFFERENTIAL/PLATELET
BASOS ABS: 0 10*3/uL (ref 0.0–0.1)
Basophils Relative: 0 %
EOS PCT: 0 %
Eosinophils Absolute: 0 10*3/uL (ref 0.0–0.7)
HCT: 22.8 % — ABNORMAL LOW (ref 39.0–52.0)
Hemoglobin: 7.4 g/dL — ABNORMAL LOW (ref 13.0–17.0)
LYMPHS ABS: 0.1 10*3/uL — AB (ref 0.7–4.0)
Lymphocytes Relative: 8 %
MCH: 25.5 pg — AB (ref 26.0–34.0)
MCHC: 32.5 g/dL (ref 30.0–36.0)
MCV: 78.6 fL (ref 78.0–100.0)
MONO ABS: 0.1 10*3/uL (ref 0.1–1.0)
MONOS PCT: 8 %
NEUTROS ABS: 1.1 10*3/uL — AB (ref 1.7–7.7)
Neutrophils Relative %: 84 %
PLATELETS: 34 10*3/uL — AB (ref 150–400)
RBC: 2.9 MIL/uL — ABNORMAL LOW (ref 4.22–5.81)
RDW: 19.2 % — AB (ref 11.5–15.5)
WBC: 1.3 10*3/uL — AB (ref 4.0–10.5)

## 2015-07-28 MED ORDER — SODIUM CHLORIDE 0.9 % IV SOLN
1.5000 g | Freq: Four times a day (QID) | INTRAVENOUS | Status: DC
  Administered 2015-07-28 – 2015-07-31 (×10): 1.5 g via INTRAVENOUS
  Filled 2015-07-28 (×14): qty 1.5

## 2015-07-28 MED ORDER — CLINDAMYCIN PHOSPHATE 300 MG/50ML IV SOLN
300.0000 mg | Freq: Three times a day (TID) | INTRAVENOUS | Status: DC
  Filled 2015-07-28 (×4): qty 50

## 2015-07-28 MED ORDER — SODIUM CHLORIDE 0.9 % IJ SOLN
10.0000 mL | INTRAMUSCULAR | Status: DC | PRN
  Administered 2015-07-29: 10 mL
  Filled 2015-07-28: qty 40

## 2015-07-28 NOTE — Progress Notes (Signed)
Peripherally Inserted Central Catheter/Midline Placement  The IV Nurse has discussed with the patient and/or persons authorized to consent for the patient, the purpose of this procedure and the potential benefits and risks involved with this procedure.  The benefits include less needle sticks, lab draws from the catheter and patient may be discharged home with the catheter.  Risks include, but not limited to, infection, bleeding, blood clot (thrombus formation), and puncture of an artery; nerve damage and irregular heat beat.  Alternatives to this procedure were also discussed.  PICC/Midline Placement Documentation  PICC Single Lumen 07/28/15 PICC Right Brachial 40 cm 0 cm (Active)  Indication for Insertion or Continuance of Line Poor Vasculature-patient has had multiple peripheral attempts or PIVs lasting less than 24 hours 07/28/2015  9:00 AM  Exposed Catheter (cm) 0 cm 07/28/2015  9:00 AM  Dressing Change Due 08/04/15 07/28/2015  9:00 AM       Jule Economy Horton 07/28/2015, 9:43 AM

## 2015-07-28 NOTE — Progress Notes (Signed)
Patient's left jaw is swollen, patient's mother reported this is new. Pt. denied pain. Karleen Hampshire, MD notified. Will continue to monitor

## 2015-07-28 NOTE — Progress Notes (Signed)
Dr. Karleen Hampshire notified of critical WBC of 1.3. No new orders at this time. Will continue to monitor closely.

## 2015-07-28 NOTE — Progress Notes (Signed)
[facility] PROGRESS NOTE  Estanislao Torstenson [id number] DOB: 04-Sep-1970 DOA: 07/18/2015 PCP: Isaias Cowman, PA-C Interim summary: Marlin Dennen is a 44 y.o. male PMH of HIV/AIDS, non adherent to treatment, recently admitted for PCP pneumonia (06/2014) discharged on HAART, TPM-SMX, Azithromycin (but did not take meds after discharge) presented with nausea, recurrent diarrhea, progressive generalized weakness, tiredness. He was treated for HCAP with zosyn. Assessment/Plan: #1 healthcare associated pneumonia:  Improved, off oxygen and completed IV antibiotics and is on MAC prophylaxis.  Po azithromycin changed to IV azithromycin as pt's is having trouble swallowing.  He remains afebrile and WBC count at 1.3 and his absolute neutrophil count is 1.1  #2 hypotension Likely secondary to hypovolemia and infectious etiology. Patient mentating well. Systolic blood pressures in the mid-high 90s. IV fluids. Also given patient IV albumin. Patient noted to have a hemoglobin of 7.3 on 07/20/2015. Patient is status post 2 units packed red blood cells with improvement with hypotension. Hemoglobin has improved to 10, but suspect he is also hemoconcentrated, as he is not eating or drinking much.  So we started him on gentle hydration with dextrose NS fluids.   #3 oral thrush Oral diflucan changed to IV diflucan as he is having trouble swallowing pills.   #4 MAI Continue azithromycin, ethambutol per ID recommendations.Rifabutin has been d/c'd per ID.  #5 AIDS Patient's CD4 less than 10 on 06/20/2015. Viral load is 71700 from 06/20/2015. Continue ART therapy per ID. ID following and appreciate input and recommendations. Skin lesions to be biopsied, Plastic surgery consulted, but patient refused.  Offered to call ENT to get palate biopsy, but he has been refusing any biopsies so far.  Mom is at bedside and is aware. She has convinced him to agree for an EGD, which will take place on Tuesday.   #6 severe  protein calorie malnutrition Patient has been started on Megace. Nutritional supplementation.BUT earlier today he became hypoglycemic and we had to start him on dextrose fluids till he can take po adequately.   #7 diarrhea Improved. Stool studies negative to date. GI panel negative. C. difficile PCR negative. Stool ova and parasites negative. AFB culture with smear on stool positive. Patient on azithromycin, rifampin, ethambutol for MAI per ID. ID following.  #8 history of CHF/cardiomyopathy Prior 2-D echo with EF of 30-35%. Patient dehydrated and hypovolemic. Continue IV fluids. Beta blocker on hold secondary to hypotension. Follow.  #9 pancytopenia Likely secondary to AIDS... Patient denies any overt bleeding. Completed IV antibiotics. S/P 2 units of packed red blood cells secondary to drop his hemoglobin and his hypotension. Hemoglobin currently around 7. Platelets have dropped to 30,000. His absolute neutrophil count is 1.1. Transfuse if platelets are less than 20,000.  Transfuse to keep hemoglobin greater than 7. Monitor counts in am.   #10 UTI completed the course of IV antibiotics.   #11 hypokalemia/hypomagnesemia Replete as needed. Repeat in am.   #12 prognosis  Patient with AIDS and has been noncompliant with his medications. Patient presented for the second time to this hospital over the past 2 months and has been admitted 6 times over the past 2 months. Patient with failure to thrive. Patient is cachectic and deteriorating. He has been refusing medications and blood draws and tests intermittently.  Palliative care consulted and pt refused to participate in goals of care.  Psychiatry consulted to evaluate for capacity for evaluation. He was deemed to have the insight and capacity to make his own medical decisions.  #13 ? He slid from  the chair ? fall ; CT head and x rays of the hip and pelvis ordered. No pain , no bruise seen.negative exam.    Code Status: Full Family  Communication: family at bedside. Disposition Plan: mom wants to take him home after the EGD.    Consultants:  Infectious diseases: Dr. Johnnye Sima 07/18/2015  PALLIATIVE care services  Psychiatry Dr Lenna Sciara  Gastroenterology Velora Heckler.   Plastic surgery Dr Marla Roe. On 12/22  Procedures:  Chest x-ray 07/18/2015  Abdominal films 07/19/2015  2 units packed red blood cells 07/20/2015  Antibiotics:  Oral azithromycin to IV azithromycin.  Ethambutol 07/18/2015  IV Diflucan 07/18/2015  IV Zosyn 07/18/2015  To 12/22  IV vancomycin 07/18/2015>>>>07/21/2015  Oral Bactrim 07/18/2015  Rifabutin 07/18/2015 d/ced.  HPI/Subjective: Reports his face hurts.   Objective: Filed Vitals:   07/27/15 2111 07/28/15 0622  BP: 96/75 98/66  Pulse: 105 101  Temp: 98.1 F (36.7 C) 98.5 F (36.9 C)  Resp: 18 19    Intake/Output Summary (Last 24 hours) at 07/28/15 1822 Last data filed at 07/28/15 1200  Gross per 24 hour  Intake 2153.83 ml  Output      0 ml  Net 2153.83 ml   Filed Weights   07/22/15 2011 07/23/15 2100 07/26/15 2100  Weight: 52.617 kg (116 lb) 53.1 kg (117 lb 1 oz) 52.3 kg (115 lb 4.8 oz)    Exam:   General:  NAD. Cachetic. Frail  Cardiovascular: Regular rate rhythm  Respiratory: Clear to auscultation bilaterally anterior lung fields.  Abdomen: Soft, nontender, nondistended, positive bowel sounds.  Musculoskeletal: No clubbing or cyanosis. Bilateral pedal edema.  Skin:  Possible. KS lesion on the leg   Data Reviewed: Basic Metabolic Panel:  Recent Labs Lab 07/22/15 0955 07/23/15 1110 07/25/15 0944 07/25/15 1200 07/25/15 1944 07/27/15 0534 07/27/15 1200 07/28/15 1100  NA 142 141 141  --   --  136  --  137  K 2.4* 3.0* 2.5*  --  3.1* 2.6*  --  3.2*  CL 116* 113* 110  --   --  107  --  110  CO2 20* 21* 24  --   --  24  --  22  GLUCOSE 62* 81 49*  --   --  81  --  86  BUN 21* 22* 17  --   --  14  --  14  CREATININE 1.33* 1.34* 1.08  --   --   0.92  --  0.93  CALCIUM 7.0* 6.8* 7.2*  --   --  6.6*  --  6.9*  MG 1.8 2.2  --  1.7  --   --  1.5*  --    Liver Function Tests:  Recent Labs Lab 07/22/15 0955  AST 36  ALT 16*  ALKPHOS 193*  BILITOT 1.0  PROT 4.1*  ALBUMIN 1.3*   No results for input(s): LIPASE, AMYLASE in the last 168 hours. No results for input(s): AMMONIA in the last 168 hours. CBC:  Recent Labs Lab 07/22/15 0955 07/23/15 1110 07/25/15 0944 07/27/15 0534 07/28/15 1533  WBC 3.3* 2.8* 2.8* 1.7* 1.3*  NEUTROABS  --   --   --   --  1.1*  HGB 10.9* 9.4* 10.1* 7.5* 7.4*  HCT 32.2* 27.9* 30.0* 23.2* 22.8*  MCV 75.2* 75.2* 76.5* 78.9 78.6  PLT 55* 42* 44* 29* 34*   Cardiac Enzymes: No results for input(s): CKTOTAL, CKMB, CKMBINDEX, TROPONINI in the last 168 hours. BNP (last 3 results)  Recent Labs  06/20/15 2002  BNP 243.8*    ProBNP (last 3 results) No results for input(s): PROBNP in the last 8760 hours.  CBG:  Recent Labs Lab 07/25/15 1646 07/25/15 1656 07/25/15 2129  GLUCAP 17* 148* 94    Recent Results (from the past 240 hour(s))  C difficile quick scan w PCR reflex     Status: None   Collection Time: 07/19/15  1:43 PM  Result Value Ref Range Status   C Diff antigen NEGATIVE NEGATIVE Final   C Diff toxin NEGATIVE NEGATIVE Final   C Diff interpretation Negative for toxigenic C. difficile  Final     Studies: Ct Head Wo Contrast  07/27/2015  CLINICAL DATA:  Fall, history of pneumonia, HIV EXAM: CT HEAD WITHOUT CONTRAST TECHNIQUE: Contiguous axial images were obtained from the base of the skull through the vertex without intravenous contrast. COMPARISON:  None. FINDINGS: No skull fracture is noted. There is mucosal thickening with air-fluid level in right maxillary sinus. The mastoid air cells are unremarkable. Mucosal thickening with partial opacification right ethmoid air cells. No intracranial hemorrhage, mass effect or midline shift. No acute cortical infarction. No mass lesion is  noted on this unenhanced scan. The gray and white-matter differentiation is preserved. No hydrocephalus. IMPRESSION: No acute intracranial abnormality. Mucosal thickening with air-fluid level right maxillary sinus. Mucosal thickening with partial opacification right ethmoid air cells. No definite acute cortical infarction. Electronically Signed   By: Lahoma Crocker M.D.   On: 07/27/2015 14:48   Dg Hips Bilat With Pelvis 3-4 Views  07/27/2015  CLINICAL DATA:  Bilateral hip pain for 1 day. No known injury. Initial encounter. EXAM: DG HIP (WITH OR WITHOUT PELVIS) 3-4V BILAT COMPARISON:  None. FINDINGS: There is no evidence of hip fracture or dislocation. There is no evidence of arthropathy or other focal bone abnormality. IMPRESSION: Negative exam. Electronically Signed   By: Inge Rise M.D.   On: 07/27/2015 14:53   Ct Maxillofacial Wo Cm  07/28/2015  CLINICAL DATA:  Left jaw pain. No reported acute injury. Acquired immunodeficiency syndrome. EXAM: CT MAXILLOFACIAL WITHOUT CONTRAST TECHNIQUE: Multidetector CT imaging of the maxillofacial structures was performed. Multiplanar CT image reconstructions were also generated. A small metallic BB was placed on the right temple in order to reliably differentiate right from left. COMPARISON:  Head CT 07/27/2015. FINDINGS: There is diffuse edema throughout the facial subcutaneous fat. There is asymmetric soft tissue swelling over the left mandible and left maxilla. No focal fluid collections are observed on noncontrast imaging. There is no significant orbital inflammation. Multiple teeth are absent. There are multiple dental caries and multiple areas of significant periodontal disease, especially in the maxilla bilaterally. Mucosal thickening is noted within the maxillary, ethmoid and sphenoid sinuses, right-greater-than-left. There are no definite air-fluid levels. The mastoid air cells and middle ears are clear. IMPRESSION: 1. Findings are consistent with facial  cellulitis, worse on the left. No focal abscess identified on noncontrast imaging. 2. This infection is likely odontogenic in origin. There is multifocal periodontal disease and multiple dental caries. 3. Paranasal sinus mucosal thickening without definite air-fluid levels. Electronically Signed   By: Richardean Sale M.D.   On: 07/28/2015 13:13    Scheduled Meds: . antiseptic oral rinse  7 mL Mouth Rinse BID  . azithromycin  500 mg Intravenous Q24H  . dolutegravir  50 mg Oral Daily  . dronabinol  2.5 mg Oral BID AC  . emtricitabine-tenofovir AF  1 tablet Oral Daily  . ethambutol  15 mg/kg Oral  Daily  . feeding supplement  1 Container Oral TID WC  . feeding supplement (ENSURE ENLIVE)  237 mL Oral BID BM  . fluconazole (DIFLUCAN) IV  100 mg Intravenous Q24H  . folic acid  1 mg Oral Daily  . megestrol  400 mg Oral Daily  . nicotine  21 mg Transdermal Daily  . pantoprazole (PROTONIX) IV  40 mg Intravenous Q24H  . potassium chloride  40 mEq Oral BID  . sodium chloride  3 mL Intravenous Q12H  . sulfamethoxazole-trimethoprim  20 mL Oral Daily   Continuous Infusions:    Principal Problem:   HCAP (healthcare-associated pneumonia) Active Problems:   AIDS ([place])   Tobacco abuse disorder   Arterial hypotension   Anemia due to bone marrow failure (HCC)   Abdominal pain, chronic, epigastric   HIV disease (HCC)   Chronic combined systolic and diastolic congestive heart failure (HCC)   Protein-calorie malnutrition (HCC)   Cardiomyopathy (HCC)   Weight loss   Diarrhea   UTI (urinary tract infection)   Oral thrush   Dehydration   Orthostatic hypotension   Cachexia ([place])   Kaposi's disease   Mycobacterial infection, non-TB   Palliative care encounter   Hypokalemia   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 If 7PM-7AM, please contact night-coverage at [email or web address], password [email or web address] 07/28/2015, 6:22 PM  LOS: 10 days

## 2015-07-28 NOTE — Progress Notes (Signed)
ANTIBIOTIC CONSULT NOTE - INITIAL  Pharmacy Consult for Unasyn Indication: facial cellulitis  No Known Allergies  Patient Measurements: Height: 5\' 7"  (170.2 cm) Weight: 115 lb 4.8 oz (52.3 kg) IBW/kg (Calculated) : 66.1  Intake/Output from previous day: 12/25 0701 - 12/26 0700 In: 600 [P.O.:600] Out: 250 [Urine:250]    Labs:  Recent Labs  07/27/15 0534 07/28/15 1100 07/28/15 1533  WBC 1.7*  --  1.3*  HGB 7.5*  --  7.4*  PLT 29*  --  34*  CREATININE 0.92 0.93  --     Microbiology: 12/16 Blood Cx: neg  12/16 GI Panel: neg  12/16 AFB +: likely mycobacterium avium complex dissemination  12/17 CDiff: neg  Anti - infective's 12/26 Unasyn >>  12/16 vanc>> 12/19 12/16 zosyn >> 12/22   PCP prophylaxis: Septra MAC: Azithromycin and Ethambutol   Assessment: 44 yo male with new onset facial cellulitis in setting of treatment for disseminated Mycobacterium avium infection, possible candidal esophagitis and salvage therapy for advanced HIV infection.  Plan:  - Unasyn 1.5 gram IV every 6 hours - monitor pt response and ability to tolerate PO and narrow abx as possible   Vincenza Hews, PharmD, BCPS 07/28/2015, 6:31 PM Pager: [phone]

## 2015-07-28 NOTE — Progress Notes (Signed)
Lab have not been successful with attempts to draw blood from patient. On call NP notified and placed orders for PICC line to be able to draw labs. Patient verbally agreed to having PICC line placed. Unfortunately, RN was not available to place PICC tonight. This RN called back on call NP to inform about the delay. On call NP agrees to having PICC line placed later on in this morning. Order for cardiac monitoring has been placed and implemented, CCMD notified. RN will continue to monitor patient.   Ermalinda Memos, RN

## 2015-07-28 NOTE — Progress Notes (Signed)
PT Cancellation Note  Patient Details Name: [name] MRN: [id number] DOB: 1971/07/04   Cancelled Treatment:    Reason Eval/Treat Not Completed: Fatigue/lethargy limiting ability to participate.  Pt refused to work with PT.  Educated on the risks of continued bedrest including not being physically able to walk, among other medical reasons.  Pt just wanted to sleep.  PT will check back at a later date.  Pt agreeable to this.    Thanks,    Barbarann Ehlers. Anarosa Kubisiak, PT, DPT [phone]   07/28/2015, 2:53 PM

## 2015-07-28 NOTE — Progress Notes (Signed)
Patient ID: Miguel Campos, male   DOB: [birth year], 45 y.o.   MRN: [id number]         [facility]    Date of Admission:  07/18/2015     Principal Problem:   HCAP (healthcare-associated pneumonia) Active Problems:   AIDS ([place])   Tobacco abuse disorder   Arterial hypotension   Anemia due to bone marrow failure (HCC)   Abdominal pain, chronic, epigastric   HIV disease (HCC)   Chronic combined systolic and diastolic congestive heart failure (HCC)   Protein-calorie malnutrition (HCC)   Cardiomyopathy (HCC)   Weight loss   Diarrhea   UTI (urinary tract infection)   Oral thrush   Dehydration   Orthostatic hypotension   Cachexia ([place])   Kaposi's disease   Mycobacterial infection, non-TB   Palliative care encounter   Hypokalemia   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 antiseptic oral rinse  7 mL Mouth Rinse BID  . azithromycin  500 mg Intravenous Q24H  . dolutegravir  50 mg Oral Daily  . dronabinol  2.5 mg Oral BID AC  . emtricitabine-tenofovir AF  1 tablet Oral Daily  . ethambutol  15 mg/kg Oral Daily  . feeding supplement  1 Container Oral TID WC  . feeding supplement (ENSURE ENLIVE)  237 mL Oral BID BM  . fluconazole (DIFLUCAN) IV  100 mg Intravenous Q24H  . folic acid  1 mg Oral Daily  . megestrol  400 mg Oral Daily  . nicotine  21 mg Transdermal Daily  . pantoprazole (PROTONIX) IV  40 mg Intravenous Q24H  . potassium chloride  40 mEq Oral BID  . sodium chloride  3 mL Intravenous Q12H  . sulfamethoxazole-trimethoprim  20 mL Oral Daily    SUBJECTIVE: He shakes his head "no" when I ask him if he is doing well. He has been having difficulty swallowing but his nurse indicates that he has been able to take all of his medications.  Review of Systems: Review of Systems  Constitutional:       He does not give any verbal answers to  questions.    Past Medical History  Diagnosis Date  . AIDS (acquired immune deficiency syndrome) ([place]) dx 2002.     cd4 <10 07/2015.  long hx non-compliance with therpies  . Varicose veins 01/2015.     Painful in right leg. 03/21/15 laser ablation of right greater saphenous vein. 03/24/15 duplex: occlusion of right great saphenous vein from saphenofemoral junction to the level of the knee. The saphenofemoral junction and common femoral vein are patent with no clot extension. No reflux seen at saphenofemoral junction. No thrombus in the small posterior calf varicosities. Dr Jorja Loa in [facility]  . Pancytopenia ([place]) 06/2015  . Iron deficiency anemia 05/2015  . FTT (failure to thrive) in adult 05/2015  . 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   . Streptococcal pneumonia ([place]) 08/2014    Social History  Substance Use Topics  . Smoking status: Current Every Day Smoker -- 1.00 packs/day    Types: Cigarettes  . Smokeless tobacco: None  . Alcohol Use: 0.0 oz/week    0 Standard drinks or equivalent per week    Family History  Problem Relation Age of Onset  . Diabetes Mellitus II Father   . Heart failure Brother     in his 31s   No  Known Allergies  OBJECTIVE: Filed Vitals:   07/27/15 1530 07/27/15 1830 07/27/15 2111 07/28/15 0622  BP: 112/77 119/77 96/75 98/66   Pulse: 105 104 105 101  Temp: 98 F (36.7 C) 98.1 F (36.7 C) 98.1 F (36.7 C) 98.5 F (36.9 C)  TempSrc: Oral Oral Oral Oral  Resp: 18 20 18 19   Height:      Weight:      SpO2: 98% 100% 100% 100%   Body mass index is 18.05 kg/(m^2).  Physical Exam  Constitutional:  He is very lethargic. He is cachectic.  HENT:  He has some dried blood on his lips.    Lab Results Lab Results  Component Value Date   WBC 1.7* 07/27/2015   HGB 7.5* 07/27/2015   HCT 23.2* 07/27/2015   MCV 78.9 07/27/2015   PLT 29* 07/27/2015    Lab Results  Component Value Date   CREATININE 0.93  07/28/2015   BUN 14 07/28/2015   NA 137 07/28/2015   K 3.2* 07/28/2015   CL 110 07/28/2015   CO2 22 07/28/2015    Lab Results  Component Value Date   ALT 16* 07/22/2015   AST 36 07/22/2015   ALKPHOS 193* 07/22/2015   BILITOT 1.0 07/22/2015    CD4 T CELL ABS (/uL)  Date Value  06/20/2015 <10*  08/28/2014 10*    Microbiology: Recent Results (from the past 240 hour(s))  Gastrointestinal Panel by PCR , Stool     Status: None   Collection Time: 07/18/15  3:44 PM  Result Value Ref Range Status   Campylobacter species NOT DETECTED NOT DETECTED Final   Plesimonas shigelloides NOT DETECTED NOT DETECTED Final   Salmonella species NOT DETECTED NOT DETECTED Final   Yersinia enterocolitica NOT DETECTED NOT DETECTED Final   Vibrio species NOT DETECTED NOT DETECTED Final   Vibrio cholerae NOT DETECTED NOT DETECTED Final   Enteroaggregative E coli (EAEC) NOT DETECTED NOT DETECTED Final   Enteropathogenic E coli (EPEC) NOT DETECTED NOT DETECTED Final   Enterotoxigenic E coli (ETEC) NOT DETECTED NOT DETECTED Final   Shiga like toxin producing E coli (STEC) NOT DETECTED NOT DETECTED Final   E. coli O157 NOT DETECTED NOT DETECTED Final   Shigella/Enteroinvasive E coli (EIEC) NOT DETECTED NOT DETECTED Final   Cryptosporidium NOT DETECTED NOT DETECTED Final   Cyclospora cayetanensis NOT DETECTED NOT DETECTED Final   Entamoeba histolytica NOT DETECTED NOT DETECTED Final   Giardia lamblia NOT DETECTED NOT DETECTED Final   Adenovirus F40/41 NOT DETECTED NOT DETECTED Final   Astrovirus NOT DETECTED NOT DETECTED Final   Norovirus GI/GII NOT DETECTED NOT DETECTED Final   Rotavirus A NOT DETECTED NOT DETECTED Final   Sapovirus (I, II, IV, and V) NOT DETECTED NOT DETECTED Final  OVA + PARASITE EXAM     Status: None   Collection Time: 07/18/15  3:44 PM  Result Value Ref Range Status   OVA + PARASITE EXAM Final report  Final    Comment: (NOTE) These results were obtained using wet preparation(s)  and trichrome stained smear. This test does not include testing for Cryptosporidium parvum, Cyclospora, or Microsporidia. Performed At: [facility] [place], [place] [id number] Lindon Romp MD [id number]    Source of Sample STOOL  Final  AFB culture with smear     Status: None (Preliminary result)   Collection Time: 07/18/15  3:44 PM  Result Value Ref Range Status   Specimen Description STOOL  Final   Special Requests  Immunocompromised  Final   Acid Fast Smear   Final    7183 3+ ACID FAST BACILLI SEEN CRITICAL RESULT CALLED TO, READ BACK BY AND VERIFIED WITH: ALBERT 14:45 08/18/14 FUENG FAXED TO 832 CRITICAL RESULT CALLED TO, READ BACK BY AND VERIFIED WITH: SUSAN CARDWELL AT H.D.10:05 07/24/15 [place] Performed at [facility]    Culture   Final    CULTURE WILL BE EXAMINED FOR 6 WEEKS BEFORE ISSUING A FINAL REPORT Performed at [facility]    Report Status PENDING  Incomplete  C difficile quick scan w PCR reflex     Status: None   Collection Time: 07/19/15  1:43 PM  Result Value Ref Range Status   C Diff antigen NEGATIVE NEGATIVE Final   C Diff toxin NEGATIVE NEGATIVE Final   C Diff interpretation Negative for toxigenic C. difficile  Final     ASSESSMENT: He is on salvage therapy for his advanced HIV infection. He is also being treated for disseminated Mycobacterium avium infection and possible candidal esophagitis. He will undergo upper endoscopy tomorrow. 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  PLAN: 1. continue current antimicrobial and antiretroviral therapies 2. we will follow-up on 07/30/2015  Michel Bickers, MD [place] [facility] [phone] pager   [phone] cell 07/28/2015, 2:37 PM

## 2015-07-29 ENCOUNTER — Inpatient Hospital Stay (HOSPITAL_COMMUNITY): Payer: 59 | Admitting: Certified Registered Nurse Anesthetist

## 2015-07-29 ENCOUNTER — Encounter (HOSPITAL_COMMUNITY)
Admission: EM | Disposition: A | Discharge status: Hospice | Payer: Self-pay | Source: Home / Self Care | Attending: Internal Medicine

## 2015-07-29 ENCOUNTER — Encounter (HOSPITAL_COMMUNITY): Payer: Self-pay | Admitting: *Deleted

## 2015-07-29 DIAGNOSIS — R1310 Dysphagia, unspecified: Secondary | ICD-10-CM | POA: Insufficient documentation

## 2015-07-29 HISTORY — PX: ESOPHAGOGASTRODUODENOSCOPY: SHX5428

## 2015-07-29 LAB — CBC
HCT: 24.5 % — ABNORMAL LOW (ref 39.0–52.0)
HEMOGLOBIN: 7.8 g/dL — AB (ref 13.0–17.0)
MCH: 25.4 pg — AB (ref 26.0–34.0)
MCHC: 31.8 g/dL (ref 30.0–36.0)
MCV: 79.8 fL (ref 78.0–100.0)
Platelets: 42 10*3/uL — ABNORMAL LOW (ref 150–400)
RBC: 3.07 MIL/uL — AB (ref 4.22–5.81)
RDW: 19.2 % — ABNORMAL HIGH (ref 11.5–15.5)
WBC: 1.3 10*3/uL — CL (ref 4.0–10.5)

## 2015-07-29 LAB — BASIC METABOLIC PANEL
ANION GAP: 5 (ref 5–15)
BUN: 14 mg/dL (ref 6–20)
CALCIUM: 7 mg/dL — AB (ref 8.9–10.3)
CHLORIDE: 113 mmol/L — AB (ref 101–111)
CO2: 21 mmol/L — AB (ref 22–32)
CREATININE: 0.89 mg/dL (ref 0.61–1.24)
GFR calc non Af Amer: >60 mL/min (ref >60)
GLUCOSE: 67 mg/dL (ref 65–99)
POTASSIUM: 3.4 mmol/L — AB (ref 3.5–5.1)
SODIUM: 139 mmol/L (ref 135–145)

## 2015-07-29 LAB — MAGNESIUM: MAGNESIUM: 1.7 mg/dL (ref 1.7–2.4)

## 2015-07-29 SURGERY — EGD (ESOPHAGOGASTRODUODENOSCOPY)
Anesthesia: Monitor Anesthesia Care

## 2015-07-29 MED ORDER — PANTOPRAZOLE SODIUM 40 MG PO TBEC
40.0000 mg | DELAYED_RELEASE_TABLET | Freq: Every day | ORAL | Status: DC
  Filled 2015-07-29: qty 1

## 2015-07-29 MED ORDER — PROPOFOL 500 MG/50ML IV EMUL
INTRAVENOUS | Status: DC | PRN
  Administered 2015-07-29: 150 ug/kg/min via INTRAVENOUS

## 2015-07-29 MED ORDER — SODIUM CHLORIDE 0.9 % IV BOLUS (SEPSIS): 1000.0000 mL | Freq: Once | INTRAVENOUS | Status: DC

## 2015-07-29 MED ORDER — SUCRALFATE 1 GM/10ML PO SUSP
1.0000 g | Freq: Four times a day (QID) | ORAL | Status: DC
  Administered 2015-07-30: 1 g via ORAL
  Filled 2015-07-29 (×8): qty 10

## 2015-07-29 MED ORDER — PANTOPRAZOLE SODIUM 40 MG IV SOLR
40.0000 mg | INTRAVENOUS | Status: DC
Start: 2015-07-29 — End: 2015-07-31
  Administered 2015-07-29 – 2015-07-30 (×2): 40 mg via INTRAVENOUS
  Filled 2015-07-29 (×3): qty 40

## 2015-07-29 MED ORDER — PROPOFOL 10 MG/ML IV BOLUS
INTRAVENOUS | Status: DC | PRN
  Administered 2015-07-29: 20 mg via INTRAVENOUS

## 2015-07-29 MED ORDER — PRO-STAT SUGAR FREE PO LIQD
30.0000 mL | Freq: Two times a day (BID) | ORAL | Status: DC
  Administered 2015-07-29: 30 mL via ORAL
  Filled 2015-07-29 (×3): qty 30

## 2015-07-29 MED ORDER — SODIUM CHLORIDE 0.9 % IV BOLUS (SEPSIS)
1000.0000 mL | Freq: Once | INTRAVENOUS | Status: AC
  Administered 2015-07-29: 1000 mL via INTRAVENOUS

## 2015-07-29 MED ORDER — LACTATED RINGERS IV SOLN
INTRAVENOUS | Status: DC | PRN
  Administered 2015-07-29: 12:00:00 via INTRAVENOUS
  Administered 2015-07-29: 1000 mL

## 2015-07-29 NOTE — Anesthesia Procedure Notes (Signed)
Procedure Name: MAC Date/Time: 07/29/2015 12:08 PM Performed by: Merrilyn Puma B Pre-anesthesia Checklist: Patient identified, Emergency Drugs available, Suction available, Patient being monitored and Timeout performed Oxygen Delivery Method: Nasal cannula Intubation Type: IV induction Placement Confirmation: positive ETCO2 and breath sounds checked- equal and bilateral Dental Injury: Teeth and Oropharynx as per pre-operative assessment
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
## 2015-07-29 NOTE — Op Note (Signed)
[facility] [place], [id number]   ENDOSCOPY PROCEDURE REPORT  PATIENT: Miguel, Campos  MR#: [id number] BIRTHDATE: 02/27/1971 , 66  yrs. old GENDER: male ENDOSCOPIST: Yetta Flock, MD REFERRED BY: PROCEDURE DATE:  07/29/2015 PROCEDURE:  EGD, diagnostic ASA CLASS:     Class III INDICATIONS:  dysphagia and epigastric pain, patient with HIV / AIDS with platelet count of 40K. MEDICATIONS: Per Anesthesia TOPICAL ANESTHETIC:  DESCRIPTION OF PROCEDURE: After the risks benefits and alternatives of the procedure were thoroughly explained, informed consent was obtained.  The Pentax Gastroscope [id number] endoscope was introduced through the mouth and advanced to the second portion of the duodenum , Without limitations.  The instrument was slowly withdrawn as the mucosa was fully examined.       FINDINGS: The esophagus was remarkable for 2 discrete areas of suspected superficial ulceration around 35cm from the incisors, both clean based without stigmata of bleeding, roughly 5-70mm in size.  No biopsies were performed given the patient's thrombocytopenia.  The DH, GEJ, and SCJ were located 42cm from the incisors.  The SCJ was irregular and friable with patchy erythema. The stomach was remarkable for multiple small focal areas (<1cm in size) of discrete erythema without focal ulceration or erosions, located in the gastric body and antrum, which could be consistent with Kaposi sarcoma.  Biopsies were not obtained given the patient's thrombocytopenia.  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  The 2nd portion of the duodenum appeared edematous with patchy erythema without focal ulceration or erosion.  No high risk stigmata of bleeding was noted.  Retroflexed views revealed no abnormalities. The scope was then withdrawn from the patient and the procedure completed.  COMPLICATIONS: There were no  immediate complications.  ENDOSCOPIC IMPRESSION: 2 focal areas of superficial ulceration without stigmata of bleeding in the distal esophagus. Differential includes pill esophagitis vs. CMV or other infectious etiology. Biopsies not taken given the patient's thrombocytopenia. 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

## 2015-07-29 NOTE — Progress Notes (Signed)
Nutrition Follow-up  DOCUMENTATION CODES:   Severe malnutrition in context of chronic illness  INTERVENTION:  Continue Ensure Enlive po BID, each supplement provides 350 kcal and 20 grams of protein.  Continue Boost Breeze po TID, each supplement provides 250 kcal and 9 grams of protein.  Provide 30 ml Prostat po BID, each supplement provides 100 kcal and 15 grams of protein.   If po intake continues to be poor, may need consideration of starting enteral nutrition to aid in adequate nutrition needs.  RD to continue to monitor.   NUTRITION DIAGNOSIS:   Malnutrition related to chronic illness as evidenced by severe depletion of body fat, moderate depletions of muscle mass, percent weight loss; ongoing  GOAL:   Patient will meet greater than or equal to 90% of their needs; not met  MONITOR:   PO intake, Supplement acceptance, Diet advancement, I & O's, Labs, Weight trends  REASON FOR ASSESSMENT:   Malnutrition Screening Tool    ASSESSMENT:   Miguel Campos is a 44 y.o. male PMH of HIV/AIDS, non adherent to treatment, recently admitted for PCP pneumonia (06/2014) discharged on HAART, TPM-SMX, Azithromycin (but did not take meds after discharge) presented with nausea, recurrent diarrhea, progressive generalized weakness, tiredness. He also reports fevers, chills, DOE, cough, SOB, but denies chest pains. Patient is not able to walk well, recurring assistance from his family  Pt has been having trouble swallowing.   Pt underwent EGD today.   Meal completion has been 0-55%. Pt currently has Boost Breeze and Ensure ordered and has been refusing most of them. RD to additionally order Prostat to aid in wound healing as pt with a pressure ulcer. If po intake continues to be poor, may need consideration of enteral nutrition to provide adequate nutrition. RD to continue to monitor.   Labs and medications reviewed.   Diet Order:  Diet regular Room service appropriate?: Yes; Fluid  consistency:: Thin  Skin:  Wound (see comment) (Stage II pressure ulcer on sacrum)  Last BM:  12/27  Height:   Ht Readings from Last 1 Encounters:  07/20/15 5' 7"  (1.702 m)    Weight:   Wt Readings from Last 1 Encounters:  07/28/15 121 lb (54.885 kg)    Ideal Body Weight:  67.2 kg  BMI:  Body mass index is 18.95 kg/(m^2).  Estimated Nutritional Needs:   Kcal:  1700-1900  Protein:  85-100 grams  Fluid:  > 1.6 L/day  EDUCATION NEEDS:   No education needs identified at this time  Corrin Parker, MS, RD, LDN Pager # [phone] After hours/ weekend pager # [phone]

## 2015-07-29 NOTE — Progress Notes (Signed)
   07/29/15 1023  Clinical Encounter Type  Visited With Family  Visit Type Spiritual support  Referral From Nurse  Spiritual Encounters  Spiritual Needs Emotional;Grief support  Stress Factors  Patient Stress Factors Health changes  Family Stress Factors Loss  Advance Directives (For Healthcare)  Does patient have an advance directive? No  Would patient like information on creating an advanced directive? No - patient declined information  Patient's mother wants notary and witnesses around 1:00 for advance directive. Case manager also asked chaplain to visit and assess situation re: mother's and son's choices of treatment. Was being taken for procedure at time of my visit, so I will return.

## 2015-07-29 NOTE — Progress Notes (Addendum)
   07/29/15 1000  OT Visit Information  Last OT Received On 07/29/15  Assistance Needed +2  Reason Eval/Treat Not Completed Other (comment) (Spoke with nurse- pt minutes away from being transported off floor for planned procedure. OT to reattempt session later today as able. )  History of Present Illness 44 yo male admitted PNA, oral thrush, UTI, diarrhea PMH: HTN, AIDS, noncompliant with medications, CHF

## 2015-07-29 NOTE — Interval H&P Note (Signed)
History and Physical Interval Note:  07/29/2015 10:33 AM  Miguel Campos  has presented today for surgery, with the diagnosis of Kaposi Sarcoma  The various methods of treatment have been discussed with the patient and family. After consideration of risks, benefits and other options for treatment, the patient has consented to  Procedure(s): ESOPHAGOGASTRODUODENOSCOPY (EGD) (N/A) as a surgical intervention .  The patient's history has been reviewed, patient examined, no change in status, stable for surgery.  I have reviewed the patient's chart and labs.  Questions were answered to the patient's satisfaction.     Miguel Campos

## 2015-07-29 NOTE — Anesthesia Preprocedure Evaluation (Signed)
Anesthesia Evaluation  Patient identified by MRN, date of birth, ID band Patient awake    Reviewed: Allergy & Precautions, NPO status , Patient's Chart, lab work & pertinent test results  History of Anesthesia Complications Negative for: history of anesthetic complications  Airway Mallampati: III  TM Distance: >3 FB Neck ROM: Full  Mouth opening: Limited Mouth Opening  Dental  (+) Loose, Poor Dentition   Pulmonary neg shortness of breath, neg sleep apnea, neg COPD, neg recent URI, Current Smoker, neg PE   breath sounds clear to auscultation       Cardiovascular (-) hypertension(-) angina+CHF  (-) Past MI and (-) Peripheral Vascular Disease  Rhythm:Regular     Neuro/Psych PSYCHIATRIC DISORDERS negative psych ROS   GI/Hepatic Neg liver ROS, GERD  Medicated,  Endo/Other    Renal/GU      Musculoskeletal negative musculoskeletal ROS (+)   Abdominal   Peds  Hematology  (+) anemia , HIV,   Anesthesia Other Findings   Reproductive/Obstetrics                             Anesthesia Physical Anesthesia Plan  ASA: III  Anesthesia Plan: MAC   Post-op Pain Management:    Induction: Intravenous  Airway Management Planned: Natural Airway, Nasal Cannula and Simple Face Mask  Additional Equipment: None  Intra-op Plan:   Post-operative Plan:   Informed Consent: I have reviewed the patients History and Physical, chart, labs and discussed the procedure including the risks, benefits and alternatives for the proposed anesthesia with the patient or authorized representative who has indicated his/her understanding and acceptance.   Dental advisory given  Plan Discussed with: CRNA  Anesthesia Plan Comments:         Anesthesia Quick Evaluation

## 2015-07-29 NOTE — Progress Notes (Signed)
[facility] PROGRESS NOTE  Miguel Campos [id number] DOB: [birth year] DOA: 07/18/2015 PCP: Isaias Cowman, PA-C Interim summary: Miguel Campos is a 44 y.o. male PMH of HIV/AIDS, non adherent to treatment, recently admitted for PCP pneumonia (06/2014) discharged on HAART, TPM-SMX, Azithromycin (but did not take meds after discharge) presented with nausea, recurrent diarrhea, progressive generalized weakness, tiredness. He was treated for HCAP with zosyn. Assessment/Plan: #1 healthcare associated pneumonia:  Improved, off oxygen and completed IV antibiotics and is on MAC prophylaxis.  Po azithromycin changed to IV azithromycin as pt's is having trouble swallowing.  He remains afebrile and WBC count at 1.3 and his absolute neutrophil count is 1.1  #2 hypotension Likely secondary to hypovolemia and infectious etiology. Patient mentating well. Systolic blood pressures in the mid-high 90s. IV fluid boluses as needed. Also given patient IV albumin. Patient noted to have a hemoglobin of 7.3 on 07/20/2015. Patient is status post 2 units packed red blood cells with improvement with hypotension. Hemoglobin has improved to 10, but suspect he is also hemoconcentrated, as he is not eating or drinking much. Repeat hemoglobin is around 7.8.   #3 oral thrush Oral diflucan changed to IV diflucan as he is having trouble swallowing pills.   #4 MAI Continue azithromycin, ethambutol per ID recommendations.Rifabutin has been d/c'd per ID.  #5 AIDS Patient's CD4 less than 10 on 06/20/2015. Viral load is 71700 from 06/20/2015. Continue ART therapy per ID. ID following and appreciate input and recommendations. Skin lesions to be biopsied, Plastic surgery consulted, but patient refused.  Offered to call ENT to get palate biopsy, but he has been refusing any biopsies so far.  Mom is at bedside and is aware.  Underwent EGD was found to have esophagitis, and lesions similar to KS,. Recommended carafate and IV  protonix daily.   #6 severe protein calorie malnutrition Patient has been started on Megace. Nutritional supplementation.  #7 diarrhea Improved. Stool studies negative to date. GI panel negative. C. difficile PCR negative. Stool ova and parasites negative. AFB culture with smear on stool positive. Patient on azithromycin, rifampin, ethambutol for MAI per ID. ID following.  #8 history of CHF/cardiomyopathy Prior 2-D echo with EF of 30-35%. Patient dehydrated and hypovolemic. Continue IV fluids. Beta blocker on hold secondary to hypotension. Follow.  #9 pancytopenia Likely secondary to AIDS... Patient denies any overt bleeding. Completed IV antibiotics. S/P 2 units of packed red blood cells secondary to drop his hemoglobin and his hypotension. Hemoglobin is 7.8 and platelets have improved to 42,000. His absolute neutrophil count is 1.1. Transfuse if platelets are less than 20,000.  Transfuse to keep hemoglobin greater than 7. Monitor counts in am.   #10 UTI completed the course of IV antibiotics.   #11 hypokalemia/hypomagnesemia Replete as needed. Repeat in am.   #12 prognosis  Patient with AIDS and has been noncompliant with his medications. Patient presented for the second time to this hospital over the past 2 months and has been admitted 6 times over the past 2 months. Patient with failure to thrive. Patient is cachectic and deteriorating. He has been refusing medications and blood draws and tests intermittently.  Palliative care consulted and pt refused to participate in goals of care.  Psychiatry consulted to evaluate for capacity for evaluation. He was deemed to have the insight and capacity to make his own medical decisions.  #13 ? He slid from the chair ? fall ; CT head and x rays of the hip and pelvis ordered. No pain , no bruise  seen.negative exam.    #14 left jaw swelling CT Maxillo facial showed facial cellulitis worse on the left with periodontal disease and dental caries. No  focal abscess seen on the CT.  Started him on IV unasyn, discussed with Dr Megan Salon on 12/27   Code Status: Full Family Communication: family at bedside. Disposition Plan: possible d/c home with home hospice if mom is agreeable vs home with home pt IN 1 TO 2 DAYS when his platelets improve.    Consultants:  Infectious diseases: Dr. Johnnye Sima 07/18/2015  PALLIATIVE care services  Psychiatry Dr Lenna Sciara  Gastroenterology Velora Heckler.   Plastic surgery Dr Marla Roe. On 12/22  Procedures:  Chest x-ray 07/18/2015  Abdominal films 07/19/2015  2 units packed red blood cells 07/20/2015  Antibiotics:  Oral azithromycin to IV azithromycin.  Ethambutol 07/18/2015  IV Diflucan 07/18/2015  IV Zosyn 07/18/2015  To 12/22  IV vancomycin 07/18/2015>>>>07/21/2015  Oral Bactrim 07/18/2015  Rifabutin 07/18/2015 d/ced.  HPI/Subjective: No new complaints today.   Objective: Filed Vitals:   07/29/15 1347 07/29/15 1738  BP: 83/41 95/61  Pulse: 103 98  Temp: 98.9 F (37.2 C) 98.2 F (36.8 C)  Resp: 22 20    Intake/Output Summary (Last 24 hours) at 07/29/15 1803 Last data filed at 07/29/15 1532  Gross per 24 hour  Intake   1100 ml  Output      0 ml  Net   1100 ml   Filed Weights   07/23/15 2100 07/26/15 2100 07/28/15 2100  Weight: 53.1 kg (117 lb 1 oz) 52.3 kg (115 lb 4.8 oz) 54.885 kg (121 lb)    Exam:   General:  NAD. Cachetic. Frail  Cardiovascular: Regular rate rhythm  Respiratory: Clear to auscultation bilaterally anterior lung fields.  Abdomen: Soft, nontender, nondistended, positive bowel sounds.  Musculoskeletal: No clubbing or cyanosis. Bilateral pedal edema.  Skin:  Possible. KS lesion on the leg   Data Reviewed: Basic Metabolic Panel:  Recent Labs Lab 07/23/15 1110 07/25/15 0944 07/25/15 1200 07/25/15 1944 07/27/15 0534 07/27/15 1200 07/28/15 1100 07/29/15 0430  NA 141 141  --   --  136  --  137 139  K 3.0* 2.5*  --  3.1* 2.6*  --  3.2* 3.4*   CL 113* 110  --   --  107  --  110 113*  CO2 21* 24  --   --  24  --  22 21*  GLUCOSE 81 49*  --   --  81  --  86 67  BUN 22* 17  --   --  14  --  14 14  CREATININE 1.34* 1.08  --   --  0.92  --  0.93 0.89  CALCIUM 6.8* 7.2*  --   --  6.6*  --  6.9* 7.0*  MG 2.2  --  1.7  --   --  1.5*  --  1.7   Liver Function Tests: No results for input(s): AST, ALT, ALKPHOS, BILITOT, PROT, ALBUMIN in the last 168 hours. No results for input(s): LIPASE, AMYLASE in the last 168 hours. No results for input(s): AMMONIA in the last 168 hours. CBC:  Recent Labs Lab 07/23/15 1110 07/25/15 0944 07/27/15 0534 07/28/15 1533 07/29/15 0430  WBC 2.8* 2.8* 1.7* 1.3* 1.3*  NEUTROABS  --   --   --  1.1*  --   HGB 9.4* 10.1* 7.5* 7.4* 7.8*  HCT 27.9* 30.0* 23.2* 22.8* 24.5*  MCV 75.2* 76.5* 78.9 78.6 79.8  PLT 42* 44*  29* 34* 42*   Cardiac Enzymes: No results for input(s): CKTOTAL, CKMB, CKMBINDEX, TROPONINI in the last 168 hours. BNP (last 3 results)  Recent Labs  06/20/15 2002  BNP 243.8*    ProBNP (last 3 results) No results for input(s): PROBNP in the last 8760 hours.  CBG:  Recent Labs Lab 07/25/15 1646 07/25/15 1656 07/25/15 2129  GLUCAP 17* 148* 94    No results found for this or any previous visit (from the past 240 hour(s)).   Studies: Ct Maxillofacial Wo Cm  25-Aug-2015  CLINICAL DATA:  Left jaw pain. No reported acute injury. Acquired immunodeficiency syndrome. EXAM: CT MAXILLOFACIAL WITHOUT CONTRAST TECHNIQUE: Multidetector CT imaging of the maxillofacial structures was performed. Multiplanar CT image reconstructions were also generated. A small metallic BB was placed on the right temple in order to reliably differentiate right from left. COMPARISON:  Head CT 07/27/2015. FINDINGS: There is diffuse edema throughout the facial subcutaneous fat. There is asymmetric soft tissue swelling over the left mandible and left maxilla. No focal fluid collections are observed on noncontrast  imaging. There is no significant orbital inflammation. Multiple teeth are absent. There are multiple dental caries and multiple areas of significant periodontal disease, especially in the maxilla bilaterally. Mucosal thickening is noted within the maxillary, ethmoid and sphenoid sinuses, right-greater-than-left. There are no definite air-fluid levels. The mastoid air cells and middle ears are clear. IMPRESSION: 1. Findings are consistent with facial cellulitis, worse on the left. No focal abscess identified on noncontrast imaging. 2. This infection is likely odontogenic in origin. There is multifocal periodontal disease and multiple dental caries. 3. Paranasal sinus mucosal thickening without definite air-fluid levels. Electronically Signed   By: Richardean Sale M.D.   On: 25-Aug-2015 13:13    Scheduled Meds: . ampicillin-sulbactam (UNASYN) IV  1.5 g Intravenous Q6H  . antiseptic oral rinse  7 mL Mouth Rinse BID  . azithromycin  500 mg Intravenous Q24H  . dolutegravir  50 mg Oral Daily  . dronabinol  2.5 mg Oral BID AC  . emtricitabine-tenofovir AF  1 tablet Oral Daily  . ethambutol  15 mg/kg Oral Daily  . feeding supplement  1 Container Oral TID WC  . feeding supplement (ENSURE ENLIVE)  237 mL Oral BID BM  . feeding supplement (PRO-STAT SUGAR FREE 64)  30 mL Oral BID  . fluconazole (DIFLUCAN) IV  100 mg Intravenous Q24H  . folic acid  1 mg Oral Daily  . megestrol  400 mg Oral Daily  . nicotine  21 mg Transdermal Daily  . pantoprazole (PROTONIX) IV  40 mg Intravenous Q24H  . potassium chloride  40 mEq Oral BID  . sodium chloride  1,000 mL Intravenous Once  . sodium chloride  3 mL Intravenous Q12H  . sucralfate  1 g Oral 4 times per day  . sulfamethoxazole-trimethoprim  20 mL Oral Daily   Continuous Infusions:    Principal Problem:   HCAP (healthcare-associated pneumonia) Active Problems:   AIDS ([place])   Tobacco abuse disorder   Arterial hypotension   Anemia due to bone marrow failure  (HCC)   Abdominal pain, chronic, epigastric   HIV disease (HCC)   Chronic combined systolic and diastolic congestive heart failure (HCC)   Protein-calorie malnutrition (HCC)   Cardiomyopathy (HCC)   Weight loss   Diarrhea   UTI (urinary tract infection)   Oral thrush   Dehydration   Orthostatic hypotension   Cachexia ([place])   Kaposi's disease   Mycobacterial infection, non-TB  Palliative care encounter   Hypokalemia   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 If 7PM-7AM, please contact night-coverage at [email or web address], password [facility] 07/29/2015, 6:03 PM  LOS: 11 days

## 2015-07-29 NOTE — Transfer of Care (Signed)
Immediate Anesthesia Transfer of Care Note  Patient: Miguel Campos  Procedure(s) Performed: Procedure(s): ESOPHAGOGASTRODUODENOSCOPY (EGD) (N/A)  Patient Location: Endoscopy Unit  Anesthesia Type:MAC  Level of Consciousness: sedated  Airway & Oxygen Therapy: Patient Spontanous Breathing and Patient connected to nasal cannula oxygen  Post-op Assessment: Report given to RN and Post -op Vital signs reviewed and stable  Post vital signs: Reviewed and stable  Last Vitals:  Filed Vitals:   07/29/15 0500 07/29/15 1026  BP: 89/55 91/62  Pulse: 103 128  Temp: 37.4 C 37.6 C  Resp: 18 20    Complications: No apparent anesthesia complications

## 2015-07-29 NOTE — Progress Notes (Signed)
   07/29/15 1400  Clinical Encounter Type  Visited With Patient and family together  Visit Type Spiritual support  Referral From Nurse  Spiritual Encounters  Spiritual Needs Literature  Stress Factors  Patient Stress Factors Health changes;Loss of control;Major life changes  Family Stress Factors Major life changes  Advance directive notarized and witnessed, copies provided.

## 2015-07-30 ENCOUNTER — Ambulatory Visit: Payer: Self-pay | Admitting: *Deleted

## 2015-07-30 ENCOUNTER — Encounter (HOSPITAL_COMMUNITY): Payer: Self-pay | Admitting: Gastroenterology

## 2015-07-30 DIAGNOSIS — I471 Supraventricular tachycardia: Secondary | ICD-10-CM

## 2015-07-30 DIAGNOSIS — B20 Human immunodeficiency virus [HIV] disease: Secondary | ICD-10-CM

## 2015-07-30 LAB — BASIC METABOLIC PANEL
ANION GAP: 6 (ref 5–15)
BUN: 15 mg/dL (ref 6–20)
CHLORIDE: 114 mmol/L — AB (ref 101–111)
CO2: 19 mmol/L — AB (ref 22–32)
Calcium: 7.1 mg/dL — ABNORMAL LOW (ref 8.9–10.3)
Creatinine, Ser: 1.02 mg/dL (ref 0.61–1.24)
GFR calc Af Amer: >60 mL/min (ref >60)
GFR calc non Af Amer: >60 mL/min (ref >60)
GLUCOSE: 94 mg/dL (ref 65–99)
POTASSIUM: 3.4 mmol/L — AB (ref 3.5–5.1)
Sodium: 139 mmol/L (ref 135–145)

## 2015-07-30 LAB — CBC
HEMATOCRIT: 20.5 % — AB (ref 39.0–52.0)
Hemoglobin: 6.4 g/dL — CL (ref 13.0–17.0)
MCH: 24.9 pg — AB (ref 26.0–34.0)
MCHC: 31.2 g/dL (ref 30.0–36.0)
MCV: 79.8 fL (ref 78.0–100.0)
Platelets: 37 10*3/uL — ABNORMAL LOW (ref 150–400)
RBC: 2.57 MIL/uL — AB (ref 4.22–5.81)
RDW: 19.5 % — ABNORMAL HIGH (ref 11.5–15.5)
WBC: 1.1 10*3/uL — AB (ref 4.0–10.5)

## 2015-07-30 LAB — GLUCOSE, CAPILLARY
GLUCOSE-CAPILLARY: 60 mg/dL — AB (ref 65–99)
GLUCOSE-CAPILLARY: 125 mg/dL — AB (ref 65–99)
GLUCOSE-CAPILLARY: 47 mg/dL — AB (ref 65–99)
Glucose-Capillary: 64 mg/dL — ABNORMAL LOW (ref 65–99)
Glucose-Capillary: 119 mg/dL — ABNORMAL HIGH (ref 65–99)

## 2015-07-30 LAB — MAGNESIUM: Magnesium: 1.6 mg/dL — ABNORMAL LOW (ref 1.7–2.4)

## 2015-07-30 MED ORDER — DEXTROSE 50 % IV SOLN
INTRAVENOUS | Status: AC
  Administered 2015-07-30: 50 mL
  Filled 2015-07-30: qty 50

## 2015-07-30 MED ORDER — MORPHINE SULFATE (CONCENTRATE) 10 MG/0.5ML PO SOLN: 10.0000 mg | ORAL | Status: AC | PRN

## 2015-07-30 MED ORDER — MORPHINE SULFATE (CONCENTRATE) 10 MG/0.5ML PO SOLN
10.0000 mg | ORAL | Status: DC | PRN
  Administered 2015-07-30 – 2015-07-31 (×7): 10 mg via ORAL
  Filled 2015-07-30 (×7): qty 0.5

## 2015-07-30 MED ORDER — OMEPRAZOLE 2 MG/ML ORAL SUSPENSION: 20.0000 mg | Freq: Every day | ORAL | Status: AC

## 2015-07-30 MED ORDER — SODIUM CHLORIDE 0.9 % IV SOLN
Freq: Once | INTRAVENOUS | Status: AC
  Administered 2015-07-30: 08:00:00 via INTRAVENOUS

## 2015-07-30 MED ORDER — LIDOCAINE VISCOUS 2 % MT SOLN: 20.0000 mL | OROMUCOSAL | Status: AC | PRN

## 2015-07-30 MED ORDER — LORAZEPAM 2 MG/ML IJ SOLN
1.0000 mg | INTRAMUSCULAR | Status: DC | PRN
  Filled 2015-07-30: qty 1

## 2015-07-30 MED ORDER — SUCRALFATE 1 GM/10ML PO SUSP: 1.0000 g | Freq: Four times a day (QID) | ORAL | Status: AC

## 2015-07-30 MED ORDER — LORAZEPAM 2 MG/ML PO CONC: 1.0000 mg | ORAL | Status: AC | PRN

## 2015-07-30 MED ORDER — NICOTINE 21 MG/24HR TD PT24: 21.0000 mg | MEDICATED_PATCH | Freq: Every day | TRANSDERMAL | Status: AC

## 2015-07-30 NOTE — Progress Notes (Signed)
PT Cancellation Note  Patient Details Name: Miguel Campos MRN: [id number] DOB: 08-Dec-1970   Cancelled Treatment:    Reason Eval/Treat Not Completed: Medical issues which prohibited therapy Not appropriate for physical therapy at this time. HR 192. RN aware.  Ellouise Newer 07/30/2015, 10:26 AM Elayne Snare, [place]

## 2015-07-30 NOTE — Progress Notes (Signed)
Occupational Therapy Discharge Patient Details Name: Shlok Eno MRN: [id number] DOB: September 11, 1970 Today's Date: 07/30/2015  Patient discharged from OT services secondary to patient is being transferred to inpatient hospice for comfort care. According to previous therapy notes, pt has mostly refused working with therapies (PT and OT).   If plans change OR with new OT orders, please send text page to OT services: [phone] OR call office at [phone].  Please see latest therapy progress note for current level of functioning and progress toward goals.    Progress and discharge plan discussed with RN, Kom.    Chrys Racer , MS, OTR/L, CLT Pager: [phone]  07/30/2015, 1:50 PM

## 2015-07-30 NOTE — Progress Notes (Signed)
CSW contacted [facility]. They will send someone out to assess the pt as soon as possible.  Percell Locus Torben Soloway LCSWA [phone]

## 2015-07-30 NOTE — Progress Notes (Signed)
Pt refused vital sign check at this time. Also refused CBG check at this time

## 2015-07-30 NOTE — Anesthesia Postprocedure Evaluation (Signed)
Anesthesia Post Note  Patient: Investment banker, corporate  Procedure(s) Performed: Procedure(s) (LRB): ESOPHAGOGASTRODUODENOSCOPY (EGD) (N/A)  Patient location during evaluation: Endoscopy Anesthesia Type: MAC Level of consciousness: awake Pain management: pain level controlled Vital Signs Assessment: post-procedure vital signs reviewed and stable Respiratory status: spontaneous breathing Cardiovascular status: stable Postop Assessment: no signs of nausea or vomiting Anesthetic complications: no    Last Vitals:  Filed Vitals:   07/30/15 0500 07/30/15 0809  BP: 100/72 105/68  Pulse: 99 92  Temp: 37.3 C 37.2 C  Resp: 18 17    Last Pain:  Filed Vitals:   07/30/15 0810  PainSc: Asleep                 Kellan Raffield

## 2015-07-30 NOTE — Progress Notes (Signed)
07/30/2015 10:31 AM  Received a phone call from the patient's mother who was very upset that we were taking him off of the heart monitor.  I reiterated to her that Mr. Branscomb was now comfort care based on her discussion with Dr. Sheran Fava earlier, to which she acknowledged.  I explained to her our usual process when a patient transitions to comfort care, and explained everything that we would be providing to her son, to which she verbalized understanding.   Princella Pellegrini

## 2015-07-30 NOTE — Progress Notes (Signed)
Patient's mother @bedside   And requesting short-term rehab placement .  She stated that family members are in agreement with this decision.

## 2015-07-30 NOTE — Progress Notes (Signed)
Pt refused all his 10 O'clock medications. At first stated he want to take and then turning everything down.

## 2015-07-30 NOTE — Progress Notes (Signed)
Daily Rounding Note  07/30/2015, 8:18 AM  LOS: 12 days   SUBJECTIVE:       Not very engaged or verbal this AM.  Says no diarrhea or abd pain. Still eating very little if any of the meals.     OBJECTIVE:         Vital signs in last 24 hours:    Temp:  [98 F (36.7 C)-99.7 F (37.6 C)] 99 F (37.2 C) (12/28 0809) Pulse Rate:  [89-128] 92 (12/28 0809) Resp:  [17-30] 17 (12/28 0809) BP: (83-105)/(41-72) 105/68 mmHg (12/28 0809) SpO2:  [96 %-100 %] 99 % (12/28 0809) Weight:  [53.388 kg (117 lb 11.2 oz)] 53.388 kg (117 lb 11.2 oz) (12/27 2053) Last BM Date: 07/29/15 Filed Weights   07/26/15 2100 07/28/15 2100 07/29/15 2053  Weight: 52.3 kg (115 lb 4.8 oz) 54.885 kg (121 lb) 53.388 kg (117 lb 11.2 oz)   General: laconic, disengaged.  No distress.  Resting. Did not reexamine pt   Heart:  Chest: unlabored breathing Abdomen:   Extremities:  Neuro/Psych:  Alert, withdrawn.   Intake/Output from previous day: 12/27 0701 - 12/28 0700 In: 380 [P.O.:280; I.V.:100] Out: 0   Intake/Output this shift:    Lab Results:  Recent Labs  07/28/15 1533 07/29/15 0430 07/30/15 0449  WBC 1.3* 1.3* 1.1*  HGB 7.4* 7.8* 6.4*  HCT 22.8* 24.5* 20.5*  PLT 34* 42* 37*   BMET  Recent Labs  07/28/15 1100 07/29/15 0430 07/30/15 0449  NA 137 139 139  K 3.2* 3.4* 3.4*  CL 110 113* 114*  CO2 22 21* 19*  GLUCOSE 86 67 94  BUN 14 14 15   CREATININE 0.93 0.89 1.02  CALCIUM 6.9* 7.0* 7.1*   LFT No results for input(s): PROT, ALBUMIN, AST, ALT, ALKPHOS, BILITOT, BILIDIR, IBILI in the last 72 hours. PT/INR No results for input(s): LABPROT, INR in the last 72 hours. Hepatitis Panel No results for input(s): HEPBSAG, HCVAB, HEPAIGM, HEPBIGM in the last 72 hours.  Studies/Results: Ct Maxillofacial Wo Cm  07/28/2015  CLINICAL DATA:  Left jaw pain. No reported acute injury. Acquired immunodeficiency syndrome. EXAM: CT MAXILLOFACIAL  WITHOUT CONTRAST TECHNIQUE: Multidetector CT imaging of the maxillofacial structures was performed. Multiplanar CT image reconstructions were also generated. A small metallic BB was placed on the right temple in order to reliably differentiate right from left. COMPARISON:  Head CT 07/27/2015. FINDINGS: There is diffuse edema throughout the facial subcutaneous fat. There is asymmetric soft tissue swelling over the left mandible and left maxilla. No focal fluid collections are observed on noncontrast imaging. There is no significant orbital inflammation. Multiple teeth are absent. There are multiple dental caries and multiple areas of significant periodontal disease, especially in the maxilla bilaterally. Mucosal thickening is noted within the maxillary, ethmoid and sphenoid sinuses, right-greater-than-left. There are no definite air-fluid levels. The mastoid air cells and middle ears are clear. IMPRESSION: 1. Findings are consistent with facial cellulitis, worse on the left. No focal abscess identified on noncontrast imaging. 2. This infection is likely odontogenic in origin. There is multifocal periodontal disease and multiple dental caries. 3. Paranasal sinus mucosal thickening without definite air-fluid levels. Electronically Signed   By: Richardean Sale M.D.   On: 07/28/2015 13:13    Scheduled Meds: . ampicillin-sulbactam (UNASYN) IV  1.5 g Intravenous Q6H  . antiseptic oral rinse  7 mL Mouth Rinse BID  . azithromycin  500 mg Intravenous Q24H  . dolutegravir  50 mg Oral Daily  . dronabinol  2.5 mg Oral BID AC  . emtricitabine-tenofovir AF  1 tablet Oral Daily  . ethambutol  15 mg/kg Oral Daily  . feeding supplement  1 Container Oral TID WC  . feeding supplement (ENSURE ENLIVE)  237 mL Oral BID BM  . feeding supplement (PRO-STAT SUGAR FREE 64)  30 mL Oral BID  . fluconazole (DIFLUCAN) IV  100 mg Intravenous Q24H  . folic acid  1 mg Oral Daily  . megestrol  400 mg Oral Daily  . nicotine  21 mg  Transdermal Daily  . pantoprazole (PROTONIX) IV  40 mg Intravenous Q24H  . potassium chloride  40 mEq Oral BID  . sodium chloride  1,000 mL Intravenous Once  . sodium chloride  3 mL Intravenous Q12H  . sucralfate  1 g Oral 4 times per day  . sulfamethoxazole-trimethoprim  20 mL Oral Daily   Continuous Infusions:  PRN Meds:.HYDROcodone-acetaminophen, lidocaine, LORazepam, sodium chloride   ASSESMENT:   *  Iron def anemia, pancytopenia.  FOBT+.  Non-compliant with oral iron Rx.  07/29/15 EGD: non-bleeding ulcers at distal esophagus.  Irregular, friable, erythematous SCJ.  Multiple small erythemaatous gastric areas, ? KS lesions.  Benign, nodular duodenitis at bulb.  Edematous, erythematous areas in D2.  None of these findings were biopsied given his thrombocytopenia and fear, if lesions are KS, of triggering hemorrhage.   On daily oral PPI, q 6 hour Carafate.   *  N/V/D.  Improved.  Acid Fast Bacteria seen on stool stains, clx pending (6 weeks incubation started 12/16); otherwise multiple stool studies negative. EGD findings as above.   *  HIV/AIDS.  Non-compliant with Rx.    *  HCAP.   Disseminated MAI.   *  CHF.  EF 30 to 35%.    PLAN   *  GI will sign off.  Given his lack of compliance with follow up, did not set up GI ROV. Dr Silverio Decamp available PRN.    *  Would continue Carafate for 4 weeks and PPI indefinitely. Hopefully he will take his meds.     Miguel Campos  07/30/2015, 8:18 AM Pager: [phone]

## 2015-07-30 NOTE — Progress Notes (Signed)
07/30/2015 10:34 AM * This is a late entry note*  Received phone call from patient's mother inquiring about speaking to the patient's ID doctor so that she could make an appropriate decision in regards to making him comfort care.  Per a previous conversation with Dr. Sheran Fava, medical staff were under the understanding that patient is full comfort and plans for residential hospice were to be made.  Dr. Sheran Fava notified of mother's wish to speak with ID doctor as well as her misunderstanding of patient's disposition.  Dr. Sheran Fava to call mother. Princella Pellegrini

## 2015-07-30 NOTE — Progress Notes (Signed)
07/30/2015  10:24 AM  Pt in SVT sustaining in the 180s-190s.  Pt appears comfortable in bed, refusing vital signs and cbg check.  Dr. Sheran Fava notified, pt made DNR.  Tele D/C'd per order.  Will monitor. Princella Pellegrini

## 2015-07-30 NOTE — Progress Notes (Signed)
cbg  60    Received amp d50  Repeated   125 cbg  ,Patient refused to eat  Dinner tray or snack.

## 2015-07-30 NOTE — Discharge Summary (Signed)
Physician Discharge Summary  Miguel Campos [id number] DOB: Nov 05, 1970 DOA: 07/18/2015  PCP: Isaias Cowman, Hershal Coria  Admit date: 07/18/2015 Transfer date: 07/30/2015   Transfer to inpatient hospice for ongoing symptom management, comfort care.  DNR/DNI  Discharge Diagnoses:  Principal Problem:   HCAP (healthcare-associated pneumonia) Active Problems:   AIDS ([place])   Tobacco abuse disorder   Arterial hypotension   Anemia due to bone marrow failure (HCC)   Abdominal pain, chronic, epigastric   HIV disease (HCC)   Chronic combined systolic and diastolic congestive heart failure (HCC)   Protein-calorie malnutrition (HCC)   Cardiomyopathy (HCC)   Weight loss   Diarrhea   UTI (urinary tract infection)   Oral thrush   Dehydration   Orthostatic hypotension   Cachexia ([place])   Kaposi's disease   Mycobacterial infection, non-TB   Palliative care encounter   Hypokalemia   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[place])   Gastrointestinal hemorrhage associated with chronic gastritis   Adjustment disorder with other symptom   Pressure ulcer   Fall   Jaw pain   Dysphagia   Discharge Condition: fair, poor  Diet recommendation: regular/as tolerated  Wt Readings from Last 3 Encounters:  07/29/15 53.388 kg (117 lb 11.2 oz)  07/08/15 53.978 kg (119 lb)  06/23/15 61.2 kg (134 lb 14.7 oz)    History of present illness:  The patient is a 44 year old male with history of HIV/AIDS, noncompliant with treatment, who was recently admitted for PCP pneumonia and discharged on ART, Bactrim, and azithromycin, however he was noncompliant with his medications but. He presented to the hospital with nausea, diarrhea, progressive weakness and fatigue.  He also reported fevers. In the emergency department, he was hypotensive and started on IV fluids.  Chest x-ray on admission was concerning for left lower lobe pneumonia and he was started on treatment for healthcare associated pneumonia.  Hospital Course:   Healthcare associated  pneumonia, he completed a course of IV antibiotics and was transitioned from nasal cannula to room air.  Hypotension, likely secondary to hypovolemia and pneumonia and improved with IV fluids and antibiotics. He was given IV albumin. He also had recurrent hypotension secondary to progressive anemia and required several transfusion of 2 units of pack red blood cells during hospitalization. He is declining blood pressure checks.  SVT, likely secondary to the patient's electrolyte abnormalities, severe illness, and anemia.  Patient declined blood pressure check, EKG, or further management of his SVT.    Oral thrush, initially treated with oral Diflucan but transitioned to IV given his difficulty swallowing.  MAI, likely contributing to the patient's pancytopenia. He continued azithromycin, ethambutol per ID recommendations. His rifabutin has been discontinued also per their recommendations.  AIDS with a CD4 count of less than 10 on 06/20/2015. Fiber load of 71,700 on 06/20/2015. He was reportedly noncompliant with his ART after discharge from the hospital last time. He was restarted on his ART at the time of admission to the hospital. He has skin lesions concerning for malignancy, however he declined skin biopsy. He was amenable to EGD and was found to have esophagitis and lesions suggestive of Kaposi's sarcoma. He was started on Carafate and IV Protonix.  Severe protein calorie malnutrition with inability to tolerate by mouth and recurrent hypoglycemia.  He was started on Megace and nutritional supplements.  We discussed the possibility of a feeding tube, however he and his mother both stated that he would not want a feeding tube.  He required several doses of IV dextrose due to intermittent hypoglycemia.  Diarrhea, improved. His stool studies are negative today. GI pathogen was negative, C. difficile PCR was negative. Stool ova and parasites negative. AFB culture with smear on stool was positive.  He  is on treatment for MAC.  History of chronic systolic heart failure/cardiomyopathy. Echocardiogram demonstrated ejection fraction of 30-35%. His beta blocker was held secondary to hypotension.  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  On the date of discharge, his white blood cell count was 1.1, hemoglobin 6.4, platelets 37..     UTI, status post completed course of IV antibiotics.  Hypokalemia and hypomagnesemia, repleted intermittently as needed.   Prognosis:  Fair to poor. He has AIDS and has been repeatedly noncompliant with his medications with progressive failure to thrive, cachexia. He was seen by psychiatry to deemed him to have insight capacity to make his own medical decisions, and he declined medical care during hospitalization. If he were compliant with his medications, it will likely take him many months to start regaining his strength and he will likely require supplemental nutrition via enteric feeds through G-tube and aggressive therapies. He wants neither.  Recommended hospice care, DO NOT RESUSCITATE, to him and his mother. They agreed with goals of comfort and transition to either residential hospice or home with hospice. At the time of this conversation, he entered SVT with tachycardia to the 190s. I think most likely his prognosis is days to a few weeks given his progressive pancytopenia, minimal by mouth intake, frequent hypoglycemic episodes.    Left jaw swelling CT Maxillo facial showed facial cellulitis worse on the left with periodontal disease and dental caries. No focal abscess seen on the CT. Started him on IV unasyn on 12/27.  Transition to full comfort measures.    Consultants:  Infectious diseases: Dr. Johnnye Sima 07/18/2015  PALLIATIVE care services  Psychiatry Dr Lenna Sciara  Gastroenterology Velora Heckler.   Plastic surgery Dr Marla Roe. On 12/22  Procedures:  Chest x-ray 07/18/2015  Abdominal films 07/19/2015  2 units packed red blood cells  07/20/2015  Antibiotics:  Oral azithromycin to IV azithromycin.  Ethambutol 07/18/2015  IV Diflucan 07/18/2015  IV Zosyn 07/18/2015 To 12/22  IV vancomycin 07/18/2015>>>>07/21/2015  Oral Bactrim 07/18/2015  Rifabutin 07/18/2015 d/ced.  Discharge Exam: Filed Vitals:   07/30/15 0500 07/30/15 0809  BP: 100/72 105/68  Pulse: 99 92  Temp: 99.1 F (37.3 C) 99 F (37.2 C)  Resp: 18 17   Filed Vitals:   07/29/15 1738 07/29/15 2053 07/30/15 0500 07/30/15 0809  BP: 95/61 90/66 100/72 105/68  Pulse: 98 89 99 92  Temp: 98.2 F (36.8 C) 98 F (36.7 C) 99.1 F (37.3 C) 99 F (37.2 C)  TempSrc: Oral Oral Oral Oral  Resp: 20 20 18 17   Height:      Weight:  53.388 kg (117 lb 11.2 oz)    SpO2: 96% 100% 97% 99%     General: NAD. Cachetic. Frail.  Declined further examination today.  Telemetry:  Sinus tachycardia.     Discharge Instructions      Discharge Instructions    Call MD for:  difficulty breathing, headache or visual disturbances    Complete by:  As directed      Call MD for:  extreme fatigue    Complete by:  As directed      Call MD for:  hives    Complete by:  As directed      Call MD for:  persistant dizziness or light-headedness    Complete by:  As directed  Call MD for:  persistant nausea and vomiting    Complete by:  As directed      Call MD for:  severe uncontrolled pain    Complete by:  As directed      Call MD for:  temperature >100.4    Complete by:  As directed      Diet general    Complete by:  As directed      Increase activity slowly    Complete by:  As directed             Medication List    STOP taking these medications        azithromycin 600 MG tablet  Commonly known as:  ZITHROMAX     carvedilol 6.25 MG tablet  Commonly known as:  COREG     dolutegravir 50 MG tablet  Commonly known as:  TIVICAY     emtricitabine-tenofovir AF 200-25 MG tablet  Commonly known as:  DESCOVY     feeding supplement (ENSURE ENLIVE) Liqd      ferrous sulfate 325 (65 FE) MG tablet     folic acid 1 MG tablet  Commonly known as:  FOLVITE     omeprazole 20 MG capsule  Commonly known as:  PRILOSEC  Replaced by:  omeprazole 2 mg/mL Susp     polyethylene glycol powder powder  Commonly known as:  GLYCOLAX/MIRALAX      TAKE these medications        lidocaine 2 % solution  Commonly known as:  XYLOCAINE  Use as directed 20 mLs in the mouth or throat as needed for mouth pain.     LORazepam 2 MG/ML concentrated solution  Commonly known as:  ATIVAN  Take 0.5 mLs (1 mg total) by mouth every 4 (four) hours as needed for anxiety.     morphine CONCENTRATE 10 MG/0.5ML Soln concentrated solution  Take 0.5 mLs (10 mg total) by mouth every hour as needed for moderate pain, severe pain or shortness of breath.     nicotine 21 mg/24hr patch  Commonly known as:  NICODERM CQ - dosed in mg/24 hours  Place 1 patch (21 mg total) onto the skin daily.     omeprazole 2 mg/mL Susp  Commonly known as:  PRILOSEC  Take 10 mLs (20 mg total) by mouth daily.     sucralfate 1 GM/10ML suspension  Commonly known as:  CARAFATE  Take 10 mLs (1 g total) by mouth every 6 (six) hours.       Follow-up Information    Follow up with Isaias Cowman, PA-C.   Specialty:  Cardiology   Contact information:   [facility]  [address] [place] [address] [phone]        The results of significant diagnostics from this hospitalization (including imaging, microbiology, ancillary and laboratory) are listed below for reference.    Significant Diagnostic Studies: Ct Head Wo Contrast  07/27/2015  CLINICAL DATA:  Fall, history of pneumonia, HIV EXAM: CT HEAD WITHOUT CONTRAST TECHNIQUE: Contiguous axial images were obtained from the base of the skull through the vertex without intravenous contrast. COMPARISON:  None. FINDINGS: No skull fracture is noted. There is mucosal thickening with air-fluid level in right maxillary sinus. The  mastoid air cells are unremarkable. Mucosal thickening with partial opacification right ethmoid air cells. No intracranial hemorrhage, mass effect or midline shift. No acute cortical infarction. No mass lesion is noted on this unenhanced scan. The gray and white-matter differentiation is  preserved. No hydrocephalus. IMPRESSION: No acute intracranial abnormality. Mucosal thickening with air-fluid level right maxillary sinus. Mucosal thickening with partial opacification right ethmoid air cells. No definite acute cortical infarction. Electronically Signed   By: Lahoma Crocker M.D.   On: 07/27/2015 14:48   Dg Chest Port 1 View  07/18/2015  CLINICAL DATA:  Weakness and fever. HIV/AIDS with most recent CD4 count of less than 10 prior microL 4 weeks ago. EXAM: PORTABLE CHEST 1 VIEW COMPARISON:  06/21/2015 FINDINGS: Retrocardiac airspace opacity on the left, chronic. The right lung appears grossly clear. Cardiac and mediastinal margins appear normal. No pleural effusion identified. IMPRESSION: 1. Chronic airspace opacity in the left lower lobe, potentially from pneumonia or atelectasis. Electronically Signed   By: Van Clines M.D.   On: 07/18/2015 11:59   Dg Abd Portable 1v  07/19/2015  CLINICAL DATA:  Abdominal pain, HIV, smoker EXAM: PORTABLE ABDOMEN - 1 VIEW COMPARISON:  Correlation CT abdomen and pelvis 05/27/2015 FINDINGS: Multiple air-filled loops of small bowel in the upper abdomen likely jejunal, largest mildly dilated and showing minimal fold thickening. Findings are suggestive of the presence of enteritis. Gas is seen within the ascending and transverse colon. Paucity of gas within the descending colon and sigmoid colon. Gas at inferior pelvis likely question within urinary bladder or rectum ; has patient undergone bladder catheterization? No urinary tract calcification or acute osseous findings. IMPRESSION: Mildly prominent small bowel loops in the LEFT upper quadrant and mid abdomen, likely jejuna,  some which demonstrate mucosal fold thickening suggesting an enteritis, which can occur from numerous organisms in the setting of HIV/AIDS. Electronically Signed   By: Lavonia Dana M.D.   On: 07/19/2015 12:16   Dg Hips Bilat With Pelvis 3-4 Views  07/27/2015  CLINICAL DATA:  Bilateral hip pain for 1 day. No known injury. Initial encounter. EXAM: DG HIP (WITH OR WITHOUT PELVIS) 3-4V BILAT COMPARISON:  None. FINDINGS: There is no evidence of hip fracture or dislocation. There is no evidence of arthropathy or other focal bone abnormality. IMPRESSION: Negative exam. Electronically Signed   By: Inge Rise M.D.   On: 07/27/2015 14:53   Ct Maxillofacial Wo Cm  07/28/2015  CLINICAL DATA:  Left jaw pain. No reported acute injury. Acquired immunodeficiency syndrome. EXAM: CT MAXILLOFACIAL WITHOUT CONTRAST TECHNIQUE: Multidetector CT imaging of the maxillofacial structures was performed. Multiplanar CT image reconstructions were also generated. A small metallic BB was placed on the right temple in order to reliably differentiate right from left. COMPARISON:  Head CT 07/27/2015. FINDINGS: There is diffuse edema throughout the facial subcutaneous fat. There is asymmetric soft tissue swelling over the left mandible and left maxilla. No focal fluid collections are observed on noncontrast imaging. There is no significant orbital inflammation. Multiple teeth are absent. There are multiple dental caries and multiple areas of significant periodontal disease, especially in the maxilla bilaterally. Mucosal thickening is noted within the maxillary, ethmoid and sphenoid sinuses, right-greater-than-left. There are no definite air-fluid levels. The mastoid air cells and middle ears are clear. IMPRESSION: 1. Findings are consistent with facial cellulitis, worse on the left. No focal abscess identified on noncontrast imaging. 2. This infection is likely odontogenic in origin. There is multifocal periodontal disease and multiple  dental caries. 3. Paranasal sinus mucosal thickening without definite air-fluid levels. Electronically Signed   By: Richardean Sale M.D.   On: 07/28/2015 13:13    Microbiology: No results found for this or any previous visit (from the past 240 hour(s)).   Labs:  Basic Metabolic Panel:  Recent Labs Lab 07/25/15 0944 07/25/15 1200 07/25/15 1944 07/27/15 0534 07/27/15 1200 07/28/15 1100 07/29/15 0430 07/30/15 0449  NA 141  --   --  136  --  137 139 139  K 2.5*  --  3.1* 2.6*  --  3.2* 3.4* 3.4*  CL 110  --   --  107  --  110 113* 114*  CO2 24  --   --  24  --  22 21* 19*  GLUCOSE 49*  --   --  81  --  86 67 94  BUN 17  --   --  14  --  14 14 15   CREATININE 1.08  --   --  0.92  --  0.93 0.89 1.02  CALCIUM 7.2*  --   --  6.6*  --  6.9* 7.0* 7.1*  MG  --  1.7  --   --  1.5*  --  1.7 1.6*   Liver Function Tests: No results for input(s): AST, ALT, ALKPHOS, BILITOT, PROT, ALBUMIN in the last 168 hours. No results for input(s): LIPASE, AMYLASE in the last 168 hours. No results for input(s): AMMONIA in the last 168 hours. CBC:  Recent Labs Lab 07/25/15 0944 07/27/15 0534 07/28/15 1533 07/29/15 0430 07/30/15 0449  WBC 2.8* 1.7* 1.3* 1.3* 1.1*  NEUTROABS  --   --  1.1*  --   --   HGB 10.1* 7.5* 7.4* 7.8* 6.4*  HCT 30.0* 23.2* 22.8* 24.5* 20.5*  MCV 76.5* 78.9 78.6 79.8 79.8  PLT 44* 29* 34* 42* 37*   Cardiac Enzymes: No results for input(s): CKTOTAL, CKMB, CKMBINDEX, TROPONINI in the last 168 hours. BNP: BNP (last 3 results)  Recent Labs  06/20/15 2002  BNP 243.8*    ProBNP (last 3 results) No results for input(s): PROBNP in the last 8760 hours.  CBG:  Recent Labs Lab 07/30/15 0329 07/30/15 0357 07/30/15 0736 07/30/15 0807 07/30/15 0850  GLUCAP 60* 125* 47* 64* 119*    Time coordinating discharge: 35 minutes  Signed:  Analiza Cowger  [facility] 07/30/2015, 12:44 PM

## 2015-07-31 LAB — CMV DNA BY PCR, QUALITATIVE: CMV DNA QL PCR: POSITIVE — AB

## 2015-07-31 MED ORDER — HEPARIN SOD (PORK) LOCK FLUSH 100 UNIT/ML IV SOLN
250.0000 [IU] | INTRAVENOUS | Status: DC | PRN
  Administered 2015-07-31: 250 [IU]
  Filled 2015-07-31 (×2): qty 3

## 2015-07-31 MED ORDER — HEPARIN SOD (PORK) LOCK FLUSH 100 UNIT/ML IV SOLN
250.0000 [IU] | Freq: Every day | INTRAVENOUS | Status: DC
Start: 2015-07-31 — End: 2015-07-31
  Filled 2015-07-31: qty 3

## 2015-07-31 NOTE — Care Management Note (Signed)
Case Management Note  Patient Details  Name: Miguel Campos MRN: [id number] Date of Birth: August 26, 1970  Subjective/Objective:        CM following for progression and d/c planning.            Action/Plan: 07/31/2015 Pt to d/c to [place], residential hospice in [place]. Family with pt .   Expected Discharge Date:       07/31/2015           Expected Discharge Plan:  [place]  In-House Referral:  Clinical Social Work  Discharge planning Services  NA  Post Acute Care Choice:  NA Choice offered to:  NA  DME Arranged:  N/A DME Agency:  NA  HH Arranged:  NA HH Agency:  NA  Status of Service:  Completed, signed off  Medicare Important Message Given:    Date Medicare IM Given:    Medicare IM give by:    Date Additional Medicare IM Given:    Additional Medicare Important Message give by:     If discussed at [place] of Stay Meetings, dates discussed:    Additional Comments:  Adron Bene, RN 07/31/2015, 4:30 PM

## 2015-07-31 NOTE — Progress Notes (Signed)
Patient refused all his oral medicines and also refused to check his bottom to check the open spots.  He refused everything and doesn't want to be bothered.

## 2015-07-31 NOTE — Progress Notes (Signed)
Patient Discharge: Disposition: Patient discharged to [facility]. Detailed report given to the nurse at facility and answered all questions. IV: Discontinued peripheral IV before discharge, but patient left to Hospice with PICC line which was Heparin locked before discharge. Transportation: Patient transported via EMS. Belongings: Patient's mother took all his belongings with her.

## 2015-07-31 NOTE — Progress Notes (Addendum)
Addendum to discharge summary from 07/30/2015 by myself:    Patient seen and examined.  Denies new pain, anxiety, shortness of breath.  Right leg still hurts.  On exam, continues to have a painful, warm, swollen right ankle with 2+ pitting edema.  Cachectic and frail appearing.  Somnolent.  Agree with transfer to residential hospice when bed available for ongoing symptom management with no changes to the previous discharge summary.  Per discussion with hospice RN, will leave PICC line place at transfer.

## 2015-07-31 NOTE — Clinical Social Work Note (Signed)
Per MD patient ready for DC to [facility]. RN, patient, patient's family, and facility notified of DC. RN given number for report. DC packet on chart. Ambulance transport to be requested ASAP by charge RN after patient has been prepared by RN. CSW signing off.   Liz Beach MSW, [place], [place], [phone]

## 2015-08-03 DEATH — deceased

## 2015-08-04 ENCOUNTER — Telehealth: Payer: Self-pay | Admitting: *Deleted

## 2015-08-06 ENCOUNTER — Encounter: Payer: Self-pay | Admitting: *Deleted

## 2015-08-06 NOTE — Progress Notes (Signed)
Patient ID: Miguel Campos, male   DOB: Apr 03, 1971, 45 y.o.   MRN: [id number] RN received a referral from Dr Tommy Medal requesting a hospital visit. RN meet with the patient today in his hospital room at [facility]. Patient stated he has been sick for a while and would just like to be able to eat. Patient's cousin who he refers to as his sister, Miguel Campos, was present for today's visit as well. RN explained to the patient my role and how I would love to assist him with staying in care and on his medications. Patient agreed to have my services after his discharge from the Hospital. Patient stated he has had 2 male partners die from Advanced HIV. Patient has a 64yr old daughter with one of these women and he would like to live to continue to raise his daughter.  RN agreed to continue to see the patient in the hospital to build a rapport with the patient and his family.

## 2015-08-12 NOTE — Progress Notes (Signed)
Patient ID: Miguel Campos, male   DOB: 11-28-1970, 45 y.o.   MRN: [id number] RN spent a great deal of time speaking with the family/Ashanit prior to making hospital visit. Ardelia Mems is concerned that the patient wants to leave but her family is not fighting for him. RN encouraged Ardelia Mems to speak with the patient directly and have him tell her what he wants. Ardelia Mems is currently in [place] but states she will try to make it here to speak with [name] since he is refusing to speak with her over the phone. RN would like for Ashanit to see that her family is not "giving up' on [name] but, [name] has made a choose for himself. RN traveled to the Hospital and spoke with the patient who stated his body is sore and he just wants to rest. RN provided comfort to the patient and after leaving the room spoke with Dr Sheran Fava who informed me that the patient has been placed on comfort care.

## 2015-08-12 NOTE — Telephone Encounter (Signed)
Received a message from the patient's sister stating Mr. Miguel Campos died on 2022-11-06 at [facility]. I am not sure of the process for having the Information confirmed and updated in EPIC but I wanted to let the team know.

## 2015-08-12 NOTE — Patient Instructions (Signed)
Please refer to progress note for details of today's visit 

## 2015-08-26 LAB — AFB CULTURE, BLOOD

## 2015-08-26 LAB — AFB CULTURE WITH SMEAR (NOT AT ARMC): Acid Fast Smear: 7183

## 2016-10-29 IMAGING — CT CT ANGIO CHEST
1 of 8 series · 17 of 36 positions shown · IV contrast (omnipaque)
Comparison: None.

CLINICAL DATA: Shortness of breath.  Cough.

EXAM:
CT ANGIOGRAPHY CHEST WITH CONTRAST
TECHNIQUE: Multidetector CT imaging of the chest was performed using the
standard protocol during bolus administration of intravenous
contrast. Multiplanar CT image reconstructions and MIPs were
obtained to evaluate the vascular anatomy.
CONTRAST:  100mL OMNIPAQUE IOHEXOL 350 MG/ML SOLN

[Series 406: thins pacs · axial · 0.68mm/px · z∈[-377,-137]mm · 17 of 272 slices shown]
[im 16/272  lung]
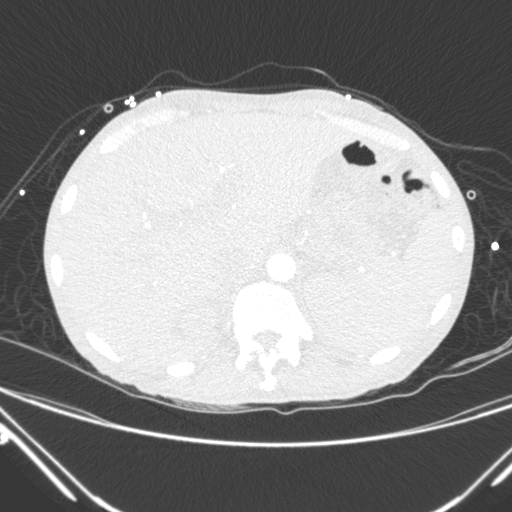
[im 31/272  mediastinal]
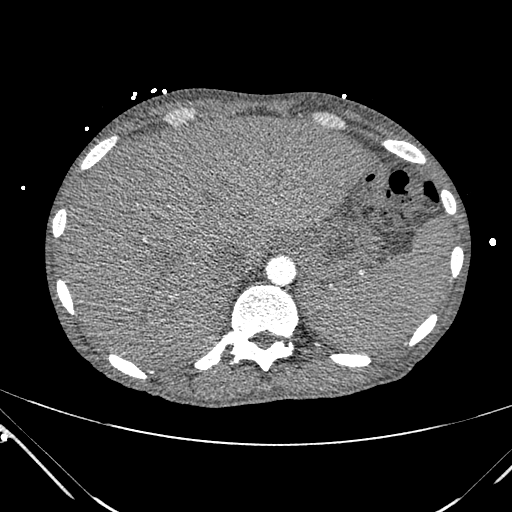
[im 46/272  lung]
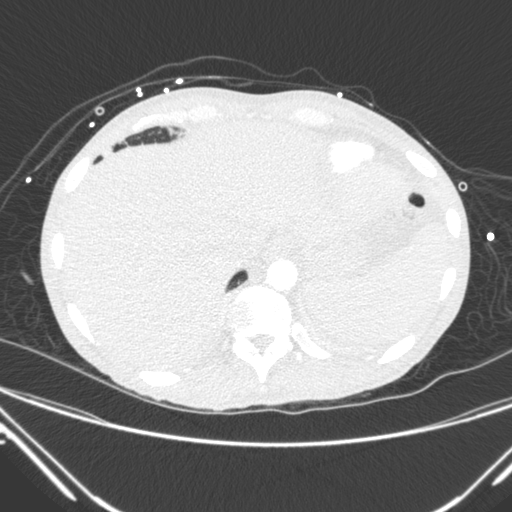
[im 61/272  mediastinal]
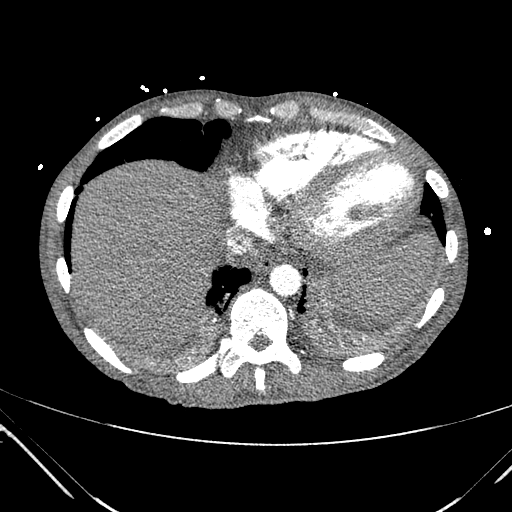
[im 76/272  lung]
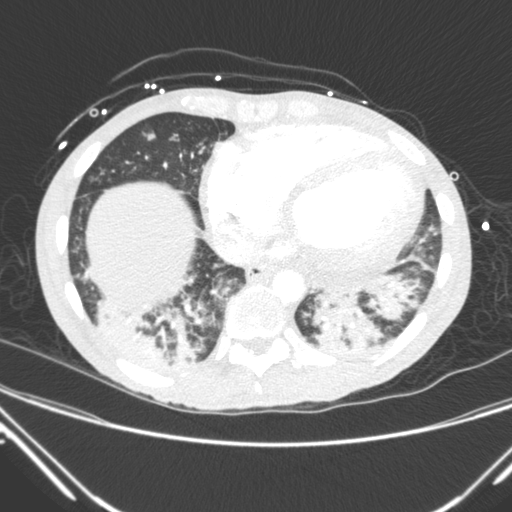
[im 91/272  mediastinal]
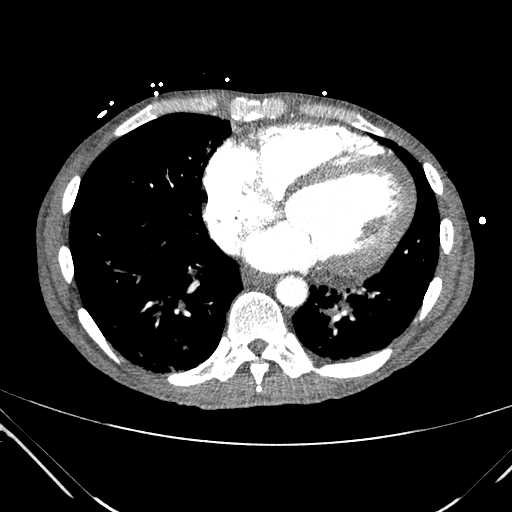
[im 106/272  lung]
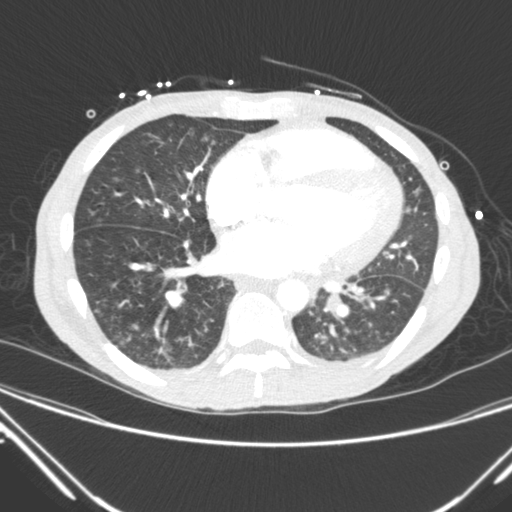
[im 121/272  mediastinal]
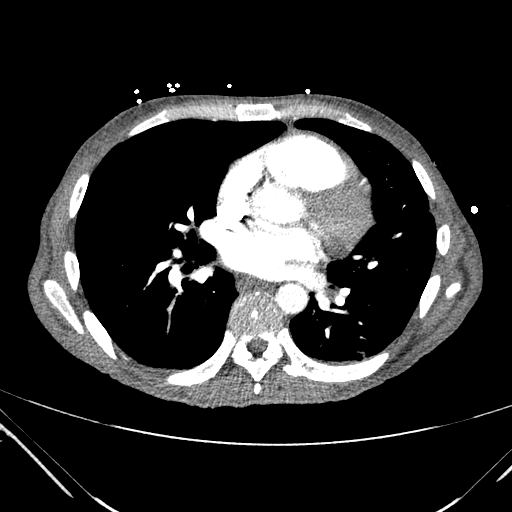
[im 136/272  lung]
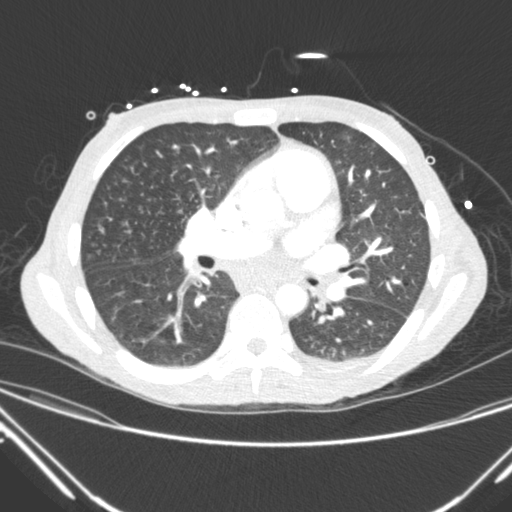
[im 151/272  mediastinal]
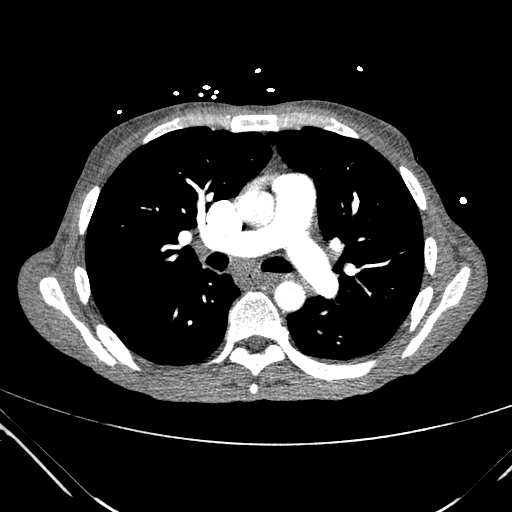
[im 166/272  lung]
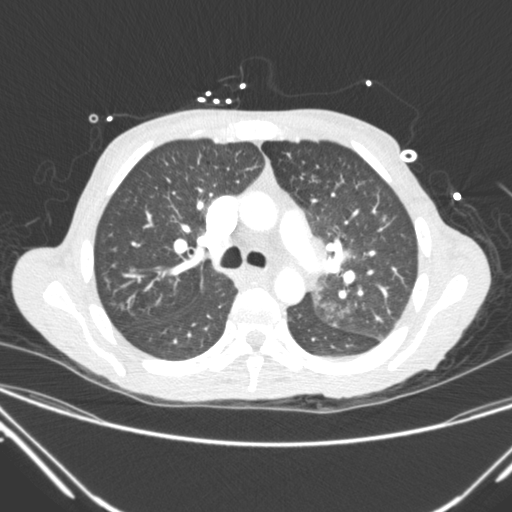
[im 181/272  mediastinal]
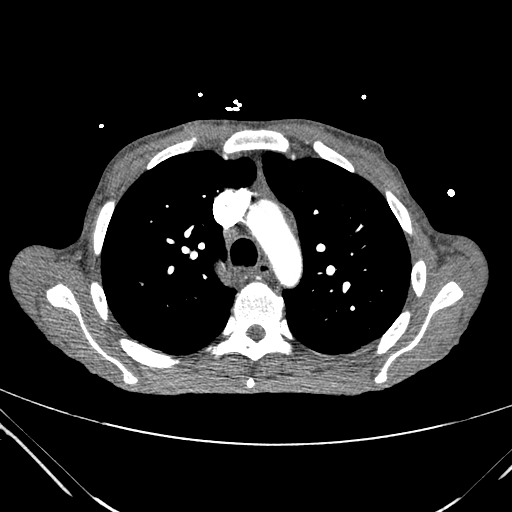
[im 196/272  lung]
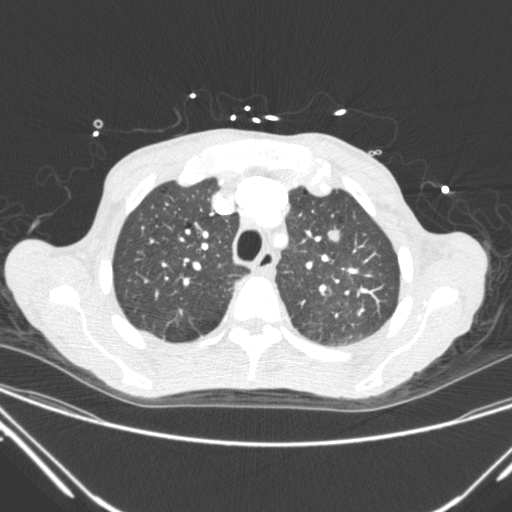
[im 211/272  mediastinal]
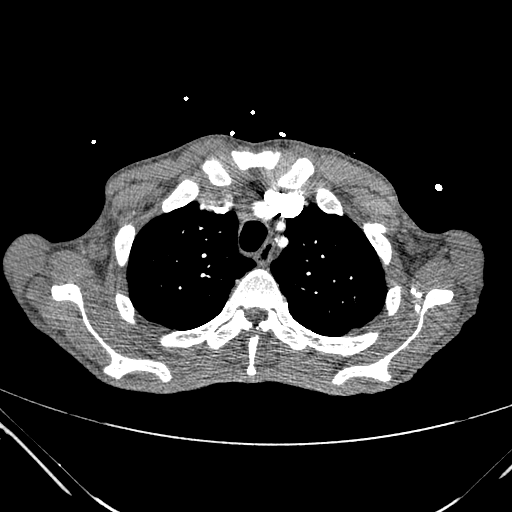
[im 226/272  lung]
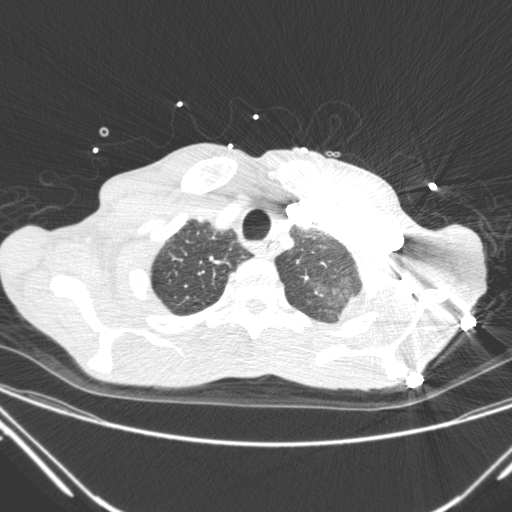
[im 241/272  mediastinal]
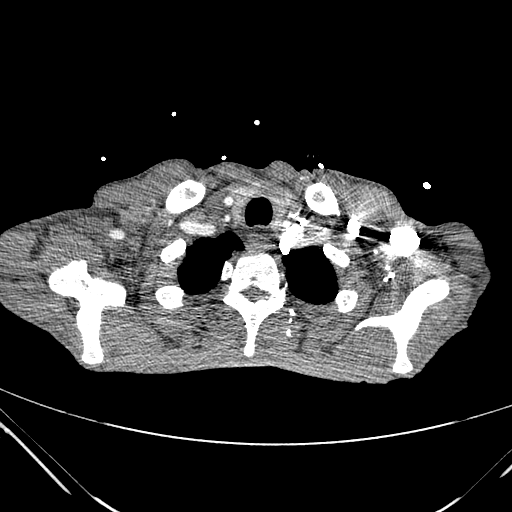
[im 256/272  lung]
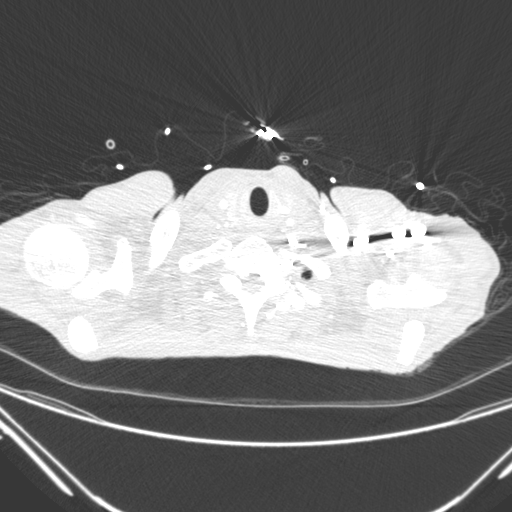

[17 of 36 positions shown; findings below may reference images not displayed]

FINDINGS: Mediastinum: Heart size is mildly enlarged. There is no pericardial
effusion identified. The trachea is patent and appears midline.
Normal appearance of the esophagus. Pre-vascular lymph node measures
1.7 cm, image 41 of series 401. There is a right hilar node which is
prominent measuring 1.1 cm, image 51 of series 401. The pulmonary
artery is patent. The right and left pulmonary arteries are also
patent. No lobar or segmental pulmonary artery filling defects
identified.

Lungs/Pleura: There is dense airspace consolidation with in both
lower lobes compatible with pneumonia. There are scattered nodular
opacities throughout both lungs. Many of these have a tree-in-bud
configuration others are more solid in appearance. Solid nodule
within the left upper lobe Measures 9 mm, image 31 of series 407.

Upper Abdomen: The visualized portions of the liver and spleen are
unremarkable.

Musculoskeletal: Review of the visualized bony structures is
unremarkable. No aggressive lytic or sclerotic bone lesions.

Review of the MIP images confirms the above findings.
IMPRESSION: 1. No evidence for pulmonary embolus.
2. Bilateral lower lobe airspace consolidation as well as diffuse
bilateral tree-in-bud nodularity and scattered solid nodules.
Findings are compatible with the clinical history of
immunodeficiency with superimposed atypical infection.
3. Prominent mediastinal lymph nodes. Nonspecific in the setting of
infection and HIV.
